# Patient Record
Sex: Male | Born: 1960
Health system: Southern US, Community
[De-identification: ages and names within clinical notes are randomized; demographics above are authoritative.]

## PROBLEM LIST (undated history)

## (undated) DIAGNOSIS — Z720 Tobacco use: Secondary | ICD-10-CM

## (undated) DIAGNOSIS — I10 Essential (primary) hypertension: Secondary | ICD-10-CM

## (undated) DIAGNOSIS — J45909 Unspecified asthma, uncomplicated: Secondary | ICD-10-CM

## (undated) DIAGNOSIS — I2510 Atherosclerotic heart disease of native coronary artery without angina pectoris: Secondary | ICD-10-CM

## (undated) DIAGNOSIS — R001 Bradycardia, unspecified: Secondary | ICD-10-CM

---

## 1999-09-05 ENCOUNTER — Encounter: Payer: Self-pay | Admitting: Emergency Medicine

## 1999-09-06 ENCOUNTER — Inpatient Hospital Stay (HOSPITAL_COMMUNITY): Admission: EM | Admit: 1999-09-06 | Discharge: 1999-09-06 | Payer: Self-pay | Admitting: Emergency Medicine

## 1999-09-20 ENCOUNTER — Ambulatory Visit (HOSPITAL_COMMUNITY): Admission: RE | Admit: 1999-09-20 | Discharge: 1999-09-20 | Payer: Self-pay | Admitting: Infectious Diseases

## 1999-09-27 ENCOUNTER — Ambulatory Visit (HOSPITAL_COMMUNITY): Admission: RE | Admit: 1999-09-27 | Discharge: 1999-09-27 | Payer: Self-pay | Admitting: Infectious Diseases

## 2000-07-01 ENCOUNTER — Emergency Department (HOSPITAL_COMMUNITY): Admission: EM | Admit: 2000-07-01 | Discharge: 2000-07-02 | Payer: Self-pay | Admitting: Emergency Medicine

## 2000-07-02 ENCOUNTER — Encounter: Payer: Self-pay | Admitting: Emergency Medicine

## 2000-07-07 ENCOUNTER — Emergency Department (HOSPITAL_COMMUNITY): Admission: EM | Admit: 2000-07-07 | Discharge: 2000-07-08 | Payer: Self-pay | Admitting: Emergency Medicine

## 2000-07-08 ENCOUNTER — Encounter: Payer: Self-pay | Admitting: Emergency Medicine

## 2000-11-25 ENCOUNTER — Inpatient Hospital Stay (HOSPITAL_COMMUNITY): Admission: EM | Admit: 2000-11-25 | Discharge: 2000-11-27 | Payer: Self-pay | Admitting: Emergency Medicine

## 2000-11-26 ENCOUNTER — Encounter: Payer: Self-pay | Admitting: Endocrinology

## 2001-03-23 ENCOUNTER — Encounter: Payer: Self-pay | Admitting: Emergency Medicine

## 2001-03-23 ENCOUNTER — Emergency Department (HOSPITAL_COMMUNITY): Admission: EM | Admit: 2001-03-23 | Discharge: 2001-03-23 | Payer: Self-pay | Admitting: Emergency Medicine

## 2002-01-12 ENCOUNTER — Emergency Department (HOSPITAL_COMMUNITY): Admission: EM | Admit: 2002-01-12 | Discharge: 2002-01-12 | Payer: Self-pay | Admitting: Emergency Medicine

## 2002-04-03 ENCOUNTER — Emergency Department (HOSPITAL_COMMUNITY): Admission: EM | Admit: 2002-04-03 | Discharge: 2002-04-03 | Payer: Self-pay | Admitting: Emergency Medicine

## 2002-04-03 ENCOUNTER — Encounter: Payer: Self-pay | Admitting: Emergency Medicine

## 2002-04-14 ENCOUNTER — Encounter: Admission: RE | Admit: 2002-04-14 | Discharge: 2002-04-14 | Payer: Self-pay | Admitting: Internal Medicine

## 2003-07-07 ENCOUNTER — Emergency Department (HOSPITAL_COMMUNITY): Admission: AD | Admit: 2003-07-07 | Discharge: 2003-07-07 | Payer: Self-pay | Admitting: Emergency Medicine

## 2003-07-21 ENCOUNTER — Emergency Department (HOSPITAL_COMMUNITY): Admission: EM | Admit: 2003-07-21 | Discharge: 2003-07-21 | Payer: Self-pay | Admitting: Emergency Medicine

## 2003-10-18 ENCOUNTER — Emergency Department (HOSPITAL_COMMUNITY): Admission: EM | Admit: 2003-10-18 | Discharge: 2003-10-19 | Payer: Self-pay | Admitting: *Deleted

## 2003-11-04 ENCOUNTER — Encounter: Admission: RE | Admit: 2003-11-04 | Discharge: 2003-11-04 | Payer: Self-pay | Admitting: Internal Medicine

## 2004-01-04 ENCOUNTER — Emergency Department (HOSPITAL_COMMUNITY): Admission: EM | Admit: 2004-01-04 | Discharge: 2004-01-05 | Payer: Self-pay | Admitting: Emergency Medicine

## 2004-05-25 ENCOUNTER — Emergency Department (HOSPITAL_COMMUNITY): Admission: EM | Admit: 2004-05-25 | Discharge: 2004-05-25 | Payer: Self-pay | Admitting: Emergency Medicine

## 2006-10-28 ENCOUNTER — Ambulatory Visit: Payer: Self-pay | Admitting: Internal Medicine

## 2007-04-01 ENCOUNTER — Ambulatory Visit: Payer: Self-pay | Admitting: Internal Medicine

## 2007-05-29 ENCOUNTER — Ambulatory Visit: Payer: Self-pay | Admitting: Family Medicine

## 2007-06-26 ENCOUNTER — Ambulatory Visit: Payer: Self-pay | Admitting: Internal Medicine

## 2007-07-28 ENCOUNTER — Ambulatory Visit (HOSPITAL_COMMUNITY): Admission: RE | Admit: 2007-07-28 | Discharge: 2007-07-28 | Payer: Self-pay | Admitting: Family Medicine

## 2007-07-29 ENCOUNTER — Ambulatory Visit: Payer: Self-pay | Admitting: Family Medicine

## 2007-07-29 ENCOUNTER — Encounter (INDEPENDENT_AMBULATORY_CARE_PROVIDER_SITE_OTHER): Payer: Self-pay | Admitting: Internal Medicine

## 2007-07-29 LAB — CONVERTED CEMR LAB
AST: 38 units/L — ABNORMAL HIGH (ref 0–37)
Albumin: 4 g/dL (ref 3.5–5.2)
Alkaline Phosphatase: 91 units/L (ref 39–117)
BUN: 12 mg/dL (ref 6–23)
CO2: 23 meq/L (ref 19–32)
Calcium: 9.3 mg/dL (ref 8.4–10.5)
Chloride: 104 meq/L (ref 96–112)
Cholesterol: 205 mg/dL — ABNORMAL HIGH (ref 0–200)
Eosinophils Absolute: 0.3 10*3/uL (ref 0.0–0.7)
Eosinophils Relative: 4 % (ref 0–5)
HCT: 45.4 % (ref 39.0–52.0)
HDL: 35 mg/dL — ABNORMAL LOW (ref >39)
LDL Cholesterol: 149 mg/dL — ABNORMAL HIGH (ref 0–99)
MCV: 91.2 fL (ref 78.0–100.0)
Monocytes Relative: 10 % (ref 3–12)
Potassium: 4.8 meq/L (ref 3.5–5.3)
Sodium: 139 meq/L (ref 135–145)
Total Bilirubin: 0.6 mg/dL (ref 0.3–1.2)
Total CHOL/HDL Ratio: 5.9
Total Protein: 7.5 g/dL (ref 6.0–8.3)

## 2007-08-07 ENCOUNTER — Ambulatory Visit: Payer: Self-pay | Admitting: Family Medicine

## 2007-10-08 ENCOUNTER — Emergency Department (HOSPITAL_COMMUNITY): Admission: EM | Admit: 2007-10-08 | Discharge: 2007-10-08 | Payer: Self-pay | Admitting: Emergency Medicine

## 2010-05-25 ENCOUNTER — Ambulatory Visit (HOSPITAL_BASED_OUTPATIENT_CLINIC_OR_DEPARTMENT_OTHER): Admission: RE | Admit: 2010-05-25 | Payer: Self-pay | Source: Home / Self Care | Admitting: Family Medicine

## 2010-06-26 ENCOUNTER — Ambulatory Visit (HOSPITAL_BASED_OUTPATIENT_CLINIC_OR_DEPARTMENT_OTHER): Payer: Self-pay

## 2010-07-03 ENCOUNTER — Emergency Department (HOSPITAL_COMMUNITY)
Admission: EM | Admit: 2010-07-03 | Discharge: 2010-07-04 | Disposition: A | Discharge status: Home / Self Care | Payer: Self-pay | Attending: Emergency Medicine | Admitting: Emergency Medicine

## 2010-07-03 DIAGNOSIS — K029 Dental caries, unspecified: Secondary | ICD-10-CM | POA: Insufficient documentation

## 2010-07-03 DIAGNOSIS — K089 Disorder of teeth and supporting structures, unspecified: Secondary | ICD-10-CM | POA: Insufficient documentation

## 2010-07-03 DIAGNOSIS — K047 Periapical abscess without sinus: Secondary | ICD-10-CM | POA: Insufficient documentation

## 2010-07-03 DIAGNOSIS — I10 Essential (primary) hypertension: Secondary | ICD-10-CM | POA: Insufficient documentation

## 2010-07-03 DIAGNOSIS — J45909 Unspecified asthma, uncomplicated: Secondary | ICD-10-CM | POA: Insufficient documentation

## 2010-07-25 ENCOUNTER — Ambulatory Visit (HOSPITAL_BASED_OUTPATIENT_CLINIC_OR_DEPARTMENT_OTHER): Payer: Self-pay

## 2010-08-28 ENCOUNTER — Ambulatory Visit (HOSPITAL_BASED_OUTPATIENT_CLINIC_OR_DEPARTMENT_OTHER): Payer: Self-pay

## 2010-09-21 NOTE — Discharge Summary (Signed)
[place]. [facility]  Patient:    Edward Farmer, Edward Farmer                        MRN: [id number] Adm. Date:  [id number] Disc. Date: [id number] Attending:  Levy Sjogren Dictator:   Tawni Millers, M.D. CC:         Fransisco Hertz, M.D.             Tawni Millers, M.D.                           Discharge Summary  DATE OF BIRTH:  1960/05/22  DISCHARGE DIAGNOSES: 1. Atypical chest pain. 2. Cocaine and alcohol abuse.  DISCHARGE MEDICATIONS:  None.  PROCEDURE PERFORMED:  None.  HISTORY OF PRESENT ILLNESS:  This is a 50 year old black male with no significant past medical history who presents with sharp chest pain that has been increasing for about three days associated with nausea, diaphoresis, and shortness of breath.  Pain is worsened by exertion and relieved by rest. There are positive palpitations and pain radiates to the left arm and feels numb.  Pain lasts varying amounts of time, usually two minutes.  He does report indigestion at times.  He also reports recent upper respiratory infection about a week ago and he also reports occasional feet swelling after being on his feet all day long.  His risk factors for coronary artery disease include smoking, family history, a father who had an MI in his 64s, and the fact that he is male.  Patient also admits to increased stress and pain with the increased stress.  Also, he gets the pain with deep breath occasionally.  PAST MEDICAL HISTORY:  Only for occasional chest pain which he says he has been getting since he was in high school.  Pain is similar to this pain, associated with activity, relieved by rest.  FAMILY HISTORY:  Father died in his 28s of a probable MI.  His mother is alive and well with hypertension.  He has siblings who are healthy.  SOCIAL HISTORY:  He is a Company secretary at [employer].  He smokes about a half pack per day for five years.  Occasional pot use, daily alcohol use, one to two  40-ouncers a day.  He answers positively to the caged questions.  He did use cocaine a few days prior to admission.  He is unmarried and has three kids.  MEDICATIONS:  Aspirin almost every day.  ALLERGIES:  He has no allergies.  REVIEW OF SYSTEMS:  Positive for occasional hot flashes, chest pain, and shortness of breath per history of present illness as well as indigestion and increased stress.  He denies dysuria, frequency, joint pain, constipation, diarrhea, headaches, or vision changes.  PHYSICAL EXAMINATION:  VITAL SIGNS:  He has a temperature of 97.2, blood pressure 106/79, pulse 96, respirations 20.  GENERAL:  He is a pleasant black male in no acute distress.  HEENT:  Within normal limits with no erythema, exudates, or thrush.  NECK:  Supple with no lymphadenopathy and no JVD.  LUNGS:  Clear to auscultation bilaterally with no wheezes or rhonchi heard. There is no distress.  CARDIOVASCULAR:  Exam shows a regular rate and rhythm with no murmurs heard and he has good carotid upstroke.  ABDOMEN:  Soft, nontender, nondistended, with normal active bowel sounds.  EXTREMITIES:  No clubbing, cyanosis, or edema, and he has  good strong peripheral pulses.  NEUROLOGIC:  Nonfocal.  HOSPITAL COURSE: #1 - CHEST PAIN:  Patient was admitted for rule-out MI.  His pain was relieved with, at times, nitroglycerin, though he continued to have an occasional episode of pain overnight.  He is ruled out with troponin I negative x 3.  His EKG also remained normal sinus rhythm with no changes of ischemia or infarction.  He was started empirically on Motrin for possible pleuritis. Patient had a lipid panel which showed that he had a cholesterol of 203 with triglycerides 85, HDL 56, and LDL of 130.  His chest x-ray showed no acute disease.  He was sent out with instructions to return on Sep 12, 1999, for a stress test with Dr. Maurice March and Dr. Verdis Frederickson.  He is to return to the emergency room if his  pain persists.  #2 - COCAINE/ALCOHOL USE:  Patient did test positive for cocaine in his urine drug screen.  He admits to using cocaine a few days prior to admission. Patient has received the number of ADS and has an appointment and says that he will follow up.  He also expresses interest in decreasing alcohol consumption. Will discuss this at a later date on his follow-ups.  #3 - SLIGHT INCREASED BILIRUBIN/AST/ALT:  Patient had a bilirubin of 2.4 upon admission with white count 15.2, hemoglobin 16.2, and platelets of 228, along with uric acid crystals, granular casts, and blood in his urine.  He also had a CK of 697 with an MB of 2.  I note that his bilirubin came down to 1.2 after being here overnight.  This was likely due to patients alcohol consumption. Will address testing patient for HIV, hepatitis B, and hepatitis C as an outpatient. DD:  09/06/99 TD:  09/08/99 Job: [id number]
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
## 2010-09-21 NOTE — Discharge Summary (Signed)
[place]. [facility]  Patient:    Edward Farmer, Edward Farmer                        MRN: [id number] Adm. Date:  [id number] Disc. Date: [id number] Attending:  Melody Haver                           Discharge Summary  HISTORY:  This is a 50 year old man who presents with a history of chest pain. The pain is nonspecific in quality.  He had previously been admitted to the hospital one year earlier with similar nonspecific pain and he had a stress test which was negative.  He presents now with similar nonspecific pain and also a history of cocaine use prior to this admission.  Physical examination was essentially normal.  ADMITTING DIAGNOSIS:  Chest pain, nonspecific with electrocardiographic changes.  HOSPITAL COURSE:  He was admitted to the hospital and observed.  He did not have any more specific chest pain.  He was seen in consultation by the cardiology service and a stress test was done.  This was negative.  The patient was then discharged.  DISCHARGE DIAGNOSIS:  Nonspecific chest pain, noncardiac.  Probably induced by cocaine abuse.  DISCHARGE MEDICATIONS:  None.  DISCHARGE INSTRUCTIONS:  The patient was advised to abstain from cocaine. DD:  12/06/00 TD:  12/08/00 Job: [id number]

## 2010-09-21 NOTE — Discharge Summary (Signed)
[place]. [facility]  Patient:    Edward Farmer, Edward Farmer                        MRN: [id number] Adm. Date:  [id number] Disc. Date: [id number] Attending:  Melody Haver                           Discharge Summary  NO DICTATION. DD:  12/06/00 TD:  12/08/00 Job: [id number]

## 2012-07-22 ENCOUNTER — Encounter (HOSPITAL_COMMUNITY): Payer: Self-pay | Admitting: *Deleted

## 2012-07-22 ENCOUNTER — Emergency Department (HOSPITAL_COMMUNITY): Payer: Self-pay

## 2012-07-22 ENCOUNTER — Emergency Department (HOSPITAL_COMMUNITY)
Admission: EM | Admit: 2012-07-22 | Discharge: 2012-07-22 | Disposition: A | Discharge status: Home / Self Care | Payer: Self-pay | Attending: Emergency Medicine | Admitting: Emergency Medicine

## 2012-07-22 DIAGNOSIS — R221 Localized swelling, mass and lump, neck: Secondary | ICD-10-CM | POA: Insufficient documentation

## 2012-07-22 DIAGNOSIS — I10 Essential (primary) hypertension: Secondary | ICD-10-CM | POA: Insufficient documentation

## 2012-07-22 DIAGNOSIS — T61771A Other fish poisoning, accidental (unintentional), initial encounter: Secondary | ICD-10-CM | POA: Insufficient documentation

## 2012-07-22 DIAGNOSIS — F17200 Nicotine dependence, unspecified, uncomplicated: Secondary | ICD-10-CM | POA: Insufficient documentation

## 2012-07-22 DIAGNOSIS — T783XXA Angioneurotic edema, initial encounter: Secondary | ICD-10-CM | POA: Insufficient documentation

## 2012-07-22 DIAGNOSIS — Y939 Activity, unspecified: Secondary | ICD-10-CM | POA: Insufficient documentation

## 2012-07-22 DIAGNOSIS — R220 Localized swelling, mass and lump, head: Secondary | ICD-10-CM | POA: Insufficient documentation

## 2012-07-22 DIAGNOSIS — J45909 Unspecified asthma, uncomplicated: Secondary | ICD-10-CM | POA: Insufficient documentation

## 2012-07-22 DIAGNOSIS — Y929 Unspecified place or not applicable: Secondary | ICD-10-CM | POA: Insufficient documentation

## 2012-07-22 HISTORY — DX: Unspecified asthma, uncomplicated: J45.909

## 2012-07-22 HISTORY — DX: Essential (primary) hypertension: I10

## 2012-07-22 LAB — COMPREHENSIVE METABOLIC PANEL
ALT: 43 U/L (ref 0–53)
AST: 32 U/L (ref 0–37)
Alkaline Phosphatase: 104 U/L (ref 39–117)
BUN: 10 mg/dL (ref 6–23)
Calcium: 9.6 mg/dL (ref 8.4–10.5)
Chloride: 101 mEq/L (ref 96–112)
Creatinine, Ser: 0.91 mg/dL (ref 0.50–1.35)

## 2012-07-22 LAB — CBC WITH DIFFERENTIAL/PLATELET
Basophils Absolute: 0 10*3/uL (ref 0.0–0.1)
Basophils Relative: 1 % (ref 0–1)
MCH: 29.7 pg (ref 26.0–34.0)
MCHC: 34.9 g/dL (ref 30.0–36.0)
Monocytes Relative: 10 % (ref 3–12)
Neutrophils Relative %: 52 % (ref 43–77)
WBC: 8.3 10*3/uL (ref 4.0–10.5)

## 2012-07-22 LAB — TROPONIN I: Troponin I: <0.3 ng/mL (ref <0.30)

## 2012-07-22 MED ORDER — DIPHENHYDRAMINE HCL 50 MG/ML IJ SOLN
25.0000 mg | Freq: Once | INTRAMUSCULAR | Status: AC
  Administered 2012-07-22: 25 mg via INTRAVENOUS
  Filled 2012-07-22: qty 1

## 2012-07-22 MED ORDER — DIPHENHYDRAMINE HCL 25 MG PO TABS: 25.0000 mg | ORAL_TABLET | Freq: Four times a day (QID) | ORAL | Status: DC

## 2012-07-22 MED ORDER — PREDNISONE 50 MG PO TABS: ORAL_TABLET | ORAL | Status: DC

## 2012-07-22 MED ORDER — FAMOTIDINE IN NACL 20-0.9 MG/50ML-% IV SOLN
20.0000 mg | Freq: Once | INTRAVENOUS | Status: AC
  Administered 2012-07-22: 20 mg via INTRAVENOUS
  Filled 2012-07-22: qty 50

## 2012-07-22 MED ORDER — EPINEPHRINE 0.3 MG/0.3ML IJ DEVI
0.3000 mg | Freq: Once | INTRAMUSCULAR | Status: AC
  Administered 2012-07-22: 0.3 mg via INTRAMUSCULAR
  Filled 2012-07-22: qty 0.3

## 2012-07-22 MED ORDER — METHYLPREDNISOLONE SODIUM SUCC 125 MG IJ SOLR
125.0000 mg | Freq: Once | INTRAMUSCULAR | Status: AC
  Administered 2012-07-22: 125 mg via INTRAVENOUS
  Filled 2012-07-22: qty 2

## 2012-07-22 MED ORDER — FAMOTIDINE 20 MG PO TABS: 20.0000 mg | ORAL_TABLET | Freq: Two times a day (BID) | ORAL | Status: DC

## 2012-07-22 NOTE — ED Provider Notes (Signed)
History     CSN: [id number]  Arrival date & time 07/22/12  4098   First MD Initiated Contact with Patient 07/22/12 [id number]      Chief Complaint  Patient presents with  . Allergic Reaction    (Consider location/radiation/quality/duration/timing/severity/associated sxs/prior treatment) HPI Comments: Upper lip swelling for the past two days.  Progressively worsening since yesterday.  Denies difficulty breathing or swallowing.  No CP or SOB. No leg swelling.  Endorses dry cough.  Symptoms started after eating tilapia.  Also takes tizac.  The history is provided by the patient.    Past Medical History  Diagnosis Date  . Hypertension   . Asthma     History reviewed. No pertinent past surgical history.  History reviewed. No pertinent family history.  History  Substance Use Topics  . Smoking status: Current Every Day Smoker    Types: Cigarettes  . Smokeless tobacco: Not on file  . Alcohol Use: Yes     Comment: occ      Review of Systems  Constitutional: Negative for fever, activity change and appetite change.  HENT: Positive for facial swelling. Negative for congestion, sore throat, rhinorrhea and drooling.   Respiratory: Negative for cough, chest tightness and shortness of breath.   Cardiovascular: Negative for chest pain.  Gastrointestinal: Negative for nausea, vomiting and abdominal pain.  Genitourinary: Negative for dysuria, hematuria and testicular pain.  Musculoskeletal: Negative for back pain.  Skin: Negative for wound.  Neurological: Negative for dizziness, weakness and light-headedness.  A complete 10 system review of systems was obtained and all systems are negative except as noted in the HPI and PMH.    Allergies  Review of patient's allergies indicates no known allergies.  Home Medications  No current outpatient prescriptions on file.  BP 175/98  Pulse 71  Temp(Src) 97.7 F (36.5 C) (Oral)  Resp 20  SpO2 94%  Physical Exam  Constitutional: He is  oriented to person, place, and time. He appears well-developed and well-nourished. No distress.  HENT:  Head: Normocephalic and atraumatic.  Mouth/Throat: Oropharynx is clear and moist. No oropharyngeal exudate.  Upper lip swelling.   Oropharynx normal, uvula midline. No tongue elevation/  Eyes: Conjunctivae and EOM are normal. Pupils are equal, round, and reactive to light.  Neck: Normal range of motion. Neck supple.  Cardiovascular: Normal rate, regular rhythm and normal heart sounds.   No murmur heard. Pulmonary/Chest: Effort normal and breath sounds normal. No respiratory distress. He has no wheezes.  Abdominal: Soft. There is no tenderness. There is no rebound and no guarding.  Musculoskeletal: Normal range of motion. He exhibits no edema and no tenderness.  Neurological: He is alert and oriented to person, place, and time. No cranial nerve deficit. He exhibits normal muscle tone. Coordination normal.  Skin: Skin is warm.    ED Course  Procedures (including critical care time)  Labs Reviewed  COMPREHENSIVE METABOLIC PANEL - Abnormal; Notable for the following:    Glucose, Bld 112 (*)    All other components within normal limits  CBC WITH DIFFERENTIAL  TROPONIN I   Dg Chest 2 View  07/22/2012  *RADIOLOGY REPORT*  Clinical Data: Shortness of breath.  Swollen lip.  CHEST - 2 VIEW  Comparison: Chest x-ray 07/28/2007.  Findings: Linear opacities in the lung bases bilaterally are similar to the prior examination and most compatible with areas of mild chronic scarring.  No acute consolidative airspace disease. No pleural effusions.  Mild thickening of the minor fissure slightly increased compared  to the prior study.  Mild cardiomegaly with prominence of the left ventricular contour, which could suggest underlying left ventricular hypertrophy.  Pulmonary vasculature is normal.  The upper mediastinal contours are unremarkable.  Atherosclerosis in the thoracic aorta.  IMPRESSION: 1.  Mild  chronic bibasilar scarring and increasing thickening of the minor fissure. 2.  Mild cardiomegaly with evidence to suggest left ventricular hypertrophy. 3.  Atherosclerosis.   Original Report Authenticated By: Trudie Reed, M.D.      No diagnosis found.    MDM  Upper lip swelling without trouble breathing or chest pain.  No respiratory distress. No chest pain. Patient given IV fluids, antihistamines, IV steroids, IM epinephrine to treat angioedema.  Patient takes an uncertain medication for his blood pressure. This may be what is causing his angioedema.  Patient observed in the ED with no deterioration in symptoms. His upper lip swelling has improved. He has no difficulty breathing or swallowing is no tongue or throat swelling. He is instructed to discontinue his blood pressure medications which may be diltiazem.  He'll be discharged on by mouth steroids antihistamines. Needs followup for PCP. Strict return precautions discussed including worsening swelling, difficulty breathing, throat swelling, chest pain, shortness of breath, difficulty swallowing.   Date: 07/22/2012  Rate: 71  Rhythm: normal sinus rhythm  QRS Axis: normal  Intervals: normal  ST/T Wave abnormalities: lateral T wave inversions  Conduction Disutrbances:none  Narrative Interpretation: new lateral T wave inversions  Old EKG Reviewed: changes noted      Glynn Octave, MD 07/22/12 1609

## 2012-07-22 NOTE — ED Notes (Signed)
Patient transported to X-ray 

## 2012-07-22 NOTE — ED Notes (Signed)
Returned from xray

## 2012-07-22 NOTE — ED Notes (Signed)
Pt reports swelling to upper lip x 2 days, possible allergic reaction to tilapia or bp meds. Airway intact.

## 2013-12-28 ENCOUNTER — Other Ambulatory Visit (INDEPENDENT_AMBULATORY_CARE_PROVIDER_SITE_OTHER): Payer: No Typology Code available for payment source

## 2013-12-28 DIAGNOSIS — B182 Chronic viral hepatitis C: Secondary | ICD-10-CM

## 2013-12-28 LAB — CBC WITH DIFFERENTIAL/PLATELET
Basophils Absolute: 0 10*3/uL (ref 0.0–0.1)
Basophils Relative: 0 % (ref 0–1)
EOS PCT: 2 % (ref 0–5)
Eosinophils Absolute: 0.2 10*3/uL (ref 0.0–0.7)
HCT: 43.1 % (ref 39.0–52.0)
HEMOGLOBIN: 14.9 g/dL (ref 13.0–17.0)
LYMPHS PCT: 32 % (ref 12–46)
Lymphs Abs: 3.2 10*3/uL (ref 0.7–4.0)
MCH: 29.3 pg (ref 26.0–34.0)
MCHC: 34.6 g/dL (ref 30.0–36.0)
MCV: 84.8 fL (ref 78.0–100.0)
Monocytes Absolute: 0.7 10*3/uL (ref 0.1–1.0)
Monocytes Relative: 7 % (ref 3–12)
NEUTROS ABS: 5.8 10*3/uL (ref 1.7–7.7)
Neutrophils Relative %: 59 % (ref 43–77)
Platelets: 280 10*3/uL (ref 150–400)
RBC: 5.08 MIL/uL (ref 4.22–5.81)
RDW: 14.8 % (ref 11.5–15.5)
WBC: 9.9 10*3/uL (ref 4.0–10.5)

## 2013-12-29 LAB — HEPATITIS A ANTIBODY, TOTAL: HEP A TOTAL AB: NONREACTIVE

## 2013-12-29 LAB — HEPATITIS B SURFACE ANTIGEN: HEP B S AG: NEGATIVE

## 2013-12-29 LAB — ANA: Anti Nuclear Antibody(ANA): NEGATIVE

## 2013-12-29 LAB — IRON: IRON: 67 ug/dL (ref 42–165)

## 2013-12-29 LAB — PROTIME-INR
INR: 1 (ref <1.50)
PROTHROMBIN TIME: 13.2 s (ref 11.6–15.2)

## 2013-12-29 LAB — HEPATITIS B SURFACE ANTIBODY,QUALITATIVE: Hep B S Ab: POSITIVE — AB

## 2013-12-29 LAB — HEPATITIS B CORE ANTIBODY, TOTAL: Hep B Core Total Ab: REACTIVE — AB

## 2014-01-27 ENCOUNTER — Encounter: Payer: Self-pay | Admitting: Internal Medicine

## 2014-01-27 ENCOUNTER — Ambulatory Visit (INDEPENDENT_AMBULATORY_CARE_PROVIDER_SITE_OTHER): Payer: No Typology Code available for payment source | Admitting: Internal Medicine

## 2014-01-27 VITALS — BP 142/94 | HR 86 | Temp 97.8°F | Ht 70.0 in | Wt 204.0 lb

## 2014-01-27 DIAGNOSIS — E669 Obesity, unspecified: Secondary | ICD-10-CM

## 2014-01-27 DIAGNOSIS — Z23 Encounter for immunization: Secondary | ICD-10-CM

## 2014-01-27 DIAGNOSIS — B182 Chronic viral hepatitis C: Secondary | ICD-10-CM | POA: Insufficient documentation

## 2014-01-27 DIAGNOSIS — I10 Essential (primary) hypertension: Secondary | ICD-10-CM | POA: Insufficient documentation

## 2014-01-27 DIAGNOSIS — Z6832 Body mass index (BMI) 32.0-32.9, adult: Secondary | ICD-10-CM | POA: Insufficient documentation

## 2014-01-27 DIAGNOSIS — J45909 Unspecified asthma, uncomplicated: Secondary | ICD-10-CM

## 2014-01-27 NOTE — Addendum Note (Signed)
Addended by: Wendall Mola A on: 01/27/2014 10:24 AM   Modules accepted: Orders

## 2014-01-27 NOTE — Progress Notes (Signed)
+  Edward Farmer is a 53 y.o. male who presents for initial evaluation and management of a positive Hepatitis C antibody test.  Patient tested positive after plasma donation several years ago and then did not follow up about it. Hepatitis C risk factors present are: snorting drugs more than 20 years ago. Patient denies history of blood transfusion, IV drug abuse, multiple sexual partners, renal dialysis, sexual contact with person with liver disease, tattoos. Patient has had other studies performed. Results: hepatitis C RNA by PCR, result: positive. Patient has not had prior treatment for Hepatitis C. Patient does not have a past history of liver disease. Patient does not have a family history of liver disease.   HPI: He has not had any issues, no fatigue or concerns.  He does drink some alcohol.  No active drug use.    Patient does not have documented immunity to Hepatitis A. Patient does have documented immunity to Hepatitis B.     Review of Systems A comprehensive review of systems was negative.   Past Medical History  Diagnosis Date  . Hypertension   . Asthma     Prior to Admission medications   Not on File    No Known Allergies  History  Substance Use Topics  . Smoking status: Current Every Day Smoker -- 0.25 packs/day    Types: Cigarettes  . Smokeless tobacco: Never Used     Comment: cutting back  . Alcohol Use: Yes     Comment: occ    No family history on file.    Objective:   Filed Vitals:   01/27/14 0850  BP: 142/94  Pulse: 86  Temp: 97.8 F (36.6 C)   in no apparent distress and alert HEENT: anicteric Cor RRR and No murmurs clear Bowel sounds are normal, liver is not enlarged, spleen is not enlarged peripheral pulses normal, no pedal edema, no clubbing or cyanosis negative for - jaundice, spider hemangioma, telangiectasia, palmar erythema, ecchymosis and atrophy  Laboratory Genotype:  No results found for this basename: hcvgenotype   HCV viral load:   No results found for this basename: HCVQUANT   Lab Results  Component Value Date   WBC 9.9 12/28/2013   HGB 14.9 12/28/2013   HCT 43.1 12/28/2013   MCV 84.8 12/28/2013   PLT 280 12/28/2013    Lab Results  Component Value Date   CREATININE 0.91 07/22/2012   BUN 10 07/22/2012   NA 137 07/22/2012   K 3.5 07/22/2012   CL 101 07/22/2012   CO2 22 07/22/2012    Lab Results  Component Value Date   ALT 43 07/22/2012   AST 32 07/22/2012   ALKPHOS 104 07/22/2012   BILITOT 0.5 07/22/2012   INR 1.00 12/28/2013      Assessment: Hepatitis C genotype 1a - labs from PCP and will scan in.    Plan: 1) Patient counseled extensively on limiting acetaminophen to no more than 2 grams daily, avoidance of alcohol. 2) Transmission discussed with patient including sexual transmission, sharing razors and toothbrush.   3) Will need referral to gastroenterology if concern for cirrhosis 4) Will need referral for substance abuse counseling: No. 5) Will prescribe Harvoni for 12 weeks once work up complete 6) Hepatitis A vaccine Yes.   7) Hepatitis B vaccine No. 8) Pneumovax vaccine if concern for cirrhosis 9) will follow up in 2 months and consider drug assistance program at that time.

## 2014-02-09 ENCOUNTER — Ambulatory Visit (HOSPITAL_COMMUNITY): Payer: No Typology Code available for payment source

## 2014-02-23 ENCOUNTER — Ambulatory Visit (HOSPITAL_COMMUNITY)
Admission: RE | Admit: 2014-02-23 | Discharge: 2014-02-23 | Disposition: A | Discharge status: Home / Self Care | Payer: Self-pay | Source: Ambulatory Visit | Attending: Internal Medicine | Admitting: Internal Medicine

## 2014-02-23 DIAGNOSIS — B182 Chronic viral hepatitis C: Secondary | ICD-10-CM

## 2014-02-23 DIAGNOSIS — N281 Cyst of kidney, acquired: Secondary | ICD-10-CM | POA: Insufficient documentation

## 2014-02-24 ENCOUNTER — Other Ambulatory Visit (HOSPITAL_COMMUNITY): Payer: Self-pay

## 2014-03-29 ENCOUNTER — Ambulatory Visit: Payer: No Typology Code available for payment source | Admitting: Internal Medicine

## 2014-04-06 ENCOUNTER — Encounter: Payer: Self-pay | Admitting: Internal Medicine

## 2014-04-06 ENCOUNTER — Ambulatory Visit (INDEPENDENT_AMBULATORY_CARE_PROVIDER_SITE_OTHER): Payer: No Typology Code available for payment source | Admitting: Internal Medicine

## 2014-04-06 VITALS — BP 148/92 | HR 76 | Temp 97.5°F | Ht 70.0 in | Wt 206.0 lb

## 2014-04-06 DIAGNOSIS — B182 Chronic viral hepatitis C: Secondary | ICD-10-CM

## 2014-04-06 MED ORDER — LEDIPASVIR-SOFOSBUVIR 90-400 MG PO TABS: 1.0000 | ORAL_TABLET | Freq: Every day | ORAL | Status: DC

## 2014-04-06 NOTE — Progress Notes (Signed)
   Subjective:    Patient ID: Edward Farmer, male    DOB: December 20, 1960, 53 y.o.   MRN: [id number]  HPI Here for follow up of HCV.  Genotype 1a, started on hepatitis A series,history of alcohol use.  Had elastography and is F0/1.  No new issues.     Review of Systems     Objective:   Physical Exam        Assessment & Plan:

## 2014-04-06 NOTE — Assessment & Plan Note (Signed)
[facility].  RTC after starting or in 1 year to retry if denied. He is aware that pharmacy may contact him for more information.

## 2014-04-06 NOTE — Patient Instructions (Signed)
Date 04/06/14  Dear Mr. Edward Farmer, As discussed in the ID Clinic, your hepatitis C therapy will include the following medications:          Harvoni 90mg /400mg  tablet:           Take 1 tablet by mouth once daily   Please note that ALL MEDICATIONS WILL START ON THE SAME DATE for a total of 12 weeks. ---------------------------------------------------------------- Your HCV Treatment Start Date: TBA   Your HCV genotype:  1b    Liver Fibrosis: F0/F1   ---------------------------------------------------------------- YOUR PHARMACY CONTACT:   [facility] [address] Phone: [phone] Hours: Monday to Friday 7:30 am to 6:00 pm   Please always contact your pharmacy at least 3-4 business days before you run out of medications to ensure your next month's medication is ready or 1 week prior to running out if you receive it by mail.  Remember, each prescription is for 28 days. ---------------------------------------------------------------- GENERAL NOTES REGARDING YOUR HEPATITIS C MEDICATION:  SOFOSBUVIR/LEDIPASVIR (HARVONI): - Harvoni tablet is taken daily with OR without food. - The tablets are orange. - The tablets should be stored at room temperature.  - Acid reducing agents such as H2 blockers (ie. Pepcid (famotidine), Zantac (ranitidine), Tagamet (cimetidine), Axid (nizatidine) and proton pump inhibitors (ie. Prilosec (omeprazole), Protonix (pantoprazole), Nexium (esomeprazole), or Aciphex (rabeprazole)) can decrease effectiveness of Harvoni. Do not take until you have discussed with a health care provider.    -Antacids that contain magnesium and/or aluminum hydroxide (ie. Milk of Magensia, Rolaids, Gaviscon, Maalox, Mylanta, an dArthritis Pain Formula)can reduce absorption of Harvoni, so take them at least 4 hours before or after Harvoni.  -Calcium carbonate (calcium supplements or antacids such as Tums, Caltrate,  Os-Cal)needs to be taken at least 4 hours hours before or after Harvoni.  -[name] or any products that contain [name] like some herbal supplements  Please inform the office prior to starting any of these medications.  - The common side effects with Harvoni:      1. Fatigue      2. Headache      3. Nausea      4. Diarrhea      5. Insomnia   Support Path is a suite of resources designed to help patients start with HARVONI and move toward treatment completion GETTING STARTED Support Path helps patients access therapy and get off to an efficient start  Benefits investigation and prior authorization support Co-pay and other financial assistance A specialty pharmacy finder CO-PAY COUPON The HARVONI co-pay coupon may help eligible patients lower their out-of-pocket costs. With a co-pay coupon, most eligible patients may pay no more than $5 per co-pay (restrictions apply) www.harvoni.com call ([phone] [phone] Not valid for patients enrolled in government healthcare prescription drug programs, such as Medicare Part D and Medicaid. Patients in the coverage gap known as the "donut hole" also are not eligible The HARVONI co-pay coupon program will cover the out-of-pocket costs for HARVONI prescriptions up to a maximum of 25% of the catalog price of a 12-week regimen of HARVONI  Please note that this only lists the most common side effects and is NOT a comprehensive list of the potential side effects of these medications. For more information, please review the drug information sheets that come with your medication package from the pharmacy.  ---------------------------------------------------------------- GENERAL HELPFUL HINTS ON HCV THERAPY: 1. No alcohol. 2. Protect against sun-sensitivity/sunburns (wear sunglasses, hat, long sleeves, pants and  sunscreen). 3. Stay well-hydrated/well-moisturized. 4. Notify the ID Clinic of any changes in your other over-the-counter/herbal or  prescription medications. 5. If you miss a dose of your medication, take the missed dose as soon as you remember. Return to your regular time/dose schedule the next day.  6.  Do not stop taking your medications without first talking with your healthcare provider. 7.  You may take Tylenol (acetaminophen), as long as the dose is less than 2000 mg (OR no more than 4 tablets of the Tylenol Extra Strengths 500mg  tablet) in 24 hours. 8.  You will need to obtain routine labs and/or office visits at [facility] at weeks 2, 4, 8,  and 12 as well as 12 and 24 weeks after completion of treatment.   [name], [name] for [place], [name]  [id number] [phone]

## 2014-04-14 ENCOUNTER — Telehealth: Payer: Self-pay | Admitting: *Deleted

## 2014-04-14 NOTE — Telephone Encounter (Signed)
[facility]  [facility] is needing copies of the pt's Hep C genotype and viral load.  Left message for Delaney Meigsamara to fax these labs to [facility] at [phone].

## 2014-04-18 ENCOUNTER — Other Ambulatory Visit: Payer: Self-pay | Admitting: Internal Medicine

## 2014-04-18 DIAGNOSIS — B182 Chronic viral hepatitis C: Secondary | ICD-10-CM

## 2014-04-19 ENCOUNTER — Other Ambulatory Visit: Payer: Self-pay | Admitting: *Deleted

## 2014-04-19 DIAGNOSIS — B182 Chronic viral hepatitis C: Secondary | ICD-10-CM

## 2014-04-20 ENCOUNTER — Telehealth: Payer: Self-pay | Admitting: *Deleted

## 2014-04-20 NOTE — Telephone Encounter (Signed)
Edward Farmer, patient called stating he was returning your call "from earlier".  Please advise, unsure how to respond as there are no notes.  Andree CossHowell, Edward Farmer M, RN

## 2014-04-21 ENCOUNTER — Encounter: Payer: Self-pay | Admitting: Internal Medicine

## 2014-05-18 ENCOUNTER — Other Ambulatory Visit: Payer: No Typology Code available for payment source

## 2014-05-18 DIAGNOSIS — B182 Chronic viral hepatitis C: Secondary | ICD-10-CM

## 2014-05-18 LAB — COMPLETE METABOLIC PANEL WITH GFR
ALBUMIN: 3.8 g/dL (ref 3.5–5.2)
ALT: 22 U/L (ref 0–53)
AST: 17 U/L (ref 0–37)
Alkaline Phosphatase: 82 U/L (ref 39–117)
BUN: 14 mg/dL (ref 6–23)
CALCIUM: 10 mg/dL (ref 8.4–10.5)
CHLORIDE: 102 meq/L (ref 96–112)
CO2: 28 meq/L (ref 19–32)
Creat: 0.99 mg/dL (ref 0.50–1.35)
GFR, Est African American: >89 mL/min
GFR, Est Non African American: 86 mL/min
GLUCOSE: 83 mg/dL (ref 70–99)
Potassium: 4.4 mEq/L (ref 3.5–5.3)
Sodium: 141 mEq/L (ref 135–145)
TOTAL PROTEIN: 7.5 g/dL (ref 6.0–8.3)
Total Bilirubin: 0.4 mg/dL (ref 0.2–1.2)

## 2014-05-20 LAB — HEPATITIS C RNA QUANTITATIVE: HCV Quantitative: NOT DETECTED IU/mL (ref <15)

## 2014-05-25 ENCOUNTER — Ambulatory Visit (INDEPENDENT_AMBULATORY_CARE_PROVIDER_SITE_OTHER): Payer: No Typology Code available for payment source | Admitting: Internal Medicine

## 2014-05-25 ENCOUNTER — Encounter: Payer: Self-pay | Admitting: Internal Medicine

## 2014-05-25 VITALS — BP 175/106 | HR 91 | Temp 98.0°F | Wt 211.0 lb

## 2014-05-25 DIAGNOSIS — B182 Chronic viral hepatitis C: Secondary | ICD-10-CM

## 2014-05-25 DIAGNOSIS — I10 Essential (primary) hypertension: Secondary | ICD-10-CM

## 2014-05-25 NOTE — Assessment & Plan Note (Signed)
He is going to follow-up with his primary doctor

## 2014-05-25 NOTE — Assessment & Plan Note (Signed)
He is doing great on his medication. Undetectable after under 4 weeks. He will continue for 2 more months and I will check his labs after completion. He already has his refill. I will see him 1 week after his end of treatment lab. I did discuss that he'll get one more after that after 3 months to determine if there is relapse or not.

## 2014-05-25 NOTE — Progress Notes (Signed)
   Subjective:    Patient ID: Edward Farmer, male    DOB: [birth year], 54 y.o.   MRN: [id number]  HPI He is here for follow-up of hepatitis C. He has genotype 1A and elastography consistent with F0 to 1. He was prescribed Harvoni through support path from [facility] and has started. He is now one month into his prescription and his first set of blood tests show an undetectable virus. No significant issues and takes daily.   Review of Systems  Constitutional: Negative for fatigue.  Gastrointestinal: Negative for nausea and diarrhea.  Skin: Negative for rash.  Neurological: Negative for dizziness, light-headedness and headaches.       Objective:   Physical Exam  Constitutional: He appears well-developed and well-nourished.  Eyes: No scleral icterus.  Cardiovascular: Normal rate, regular rhythm and normal heart sounds.   No murmur heard. Pulmonary/Chest: Effort normal and breath sounds normal. No respiratory distress.  Skin: No rash noted.          Assessment & Plan:

## 2014-06-22 ENCOUNTER — Other Ambulatory Visit: Payer: Self-pay | Admitting: Internal Medicine

## 2014-06-22 ENCOUNTER — Telehealth: Payer: Self-pay | Admitting: *Deleted

## 2014-06-22 NOTE — Telephone Encounter (Signed)
Patient has received his 3rd bottle of Harvoni. Refill request denied.

## 2014-06-22 NOTE — Telephone Encounter (Signed)
Patient's pharmacy requesting refill of Harvoni. Spoke with Ulyses SouthwardMinh Pham for advice. Per CatalinaMinh, patient should have already received and started his 3rd bottle of Harvoni. Left message with patient asking him to call [facility].  Need to confirm that patient has already received 3 bottles of Harvoni.  If so, he has completed treatment and does not need this refilled.   Andree CossHowell, Lanisha Stepanian M, RN

## 2014-07-20 ENCOUNTER — Telehealth: Payer: Self-pay | Admitting: *Deleted

## 2014-07-20 ENCOUNTER — Other Ambulatory Visit (INDEPENDENT_AMBULATORY_CARE_PROVIDER_SITE_OTHER): Payer: No Typology Code available for payment source

## 2014-07-20 DIAGNOSIS — B182 Chronic viral hepatitis C: Secondary | ICD-10-CM

## 2014-07-20 NOTE — Telephone Encounter (Signed)
Patient arrived for his labs, stated to Mariea ClontsKaren Green that he felt "his blood pressure was up."  [facility]. Clydie BraunKaren notified this RN.  Patient's blood pressure was 150/95 and 161/98 on subsequent checks.  Patient reports he took his medication as directed this morning.  He notes a slight headache, "feels a little spotty." He feels he is ok without seeing a doctor here.  He states his PCP is Concepcion LivingLinda Bernhardt, F[facility].  Patient states he feels stable, just wanted to see what his numbers were.  He [facility] with [facility].  OK per Tomasita Morrowammy King, RN.  Patient left in stable condition. Andree CossHowell, Hope Brandenburger M, RN

## 2014-07-21 LAB — HEPATITIS C RNA QUANTITATIVE: HCV Quantitative: NOT DETECTED IU/mL (ref <15)

## 2014-08-09 ENCOUNTER — Ambulatory Visit: Payer: No Typology Code available for payment source | Admitting: Internal Medicine

## 2016-06-21 NOTE — Congregational Nurse Program (Signed)
Congregational Nurse Program Note  Date of Encounter: 06/17/2016  Past Medical History: Past Medical History:  Diagnosis Date  . Asthma   . Hypertension     Encounter Details:     CNP Questionnaire - 06/17/16 0953      Patient Demographics   Is this a new or existing patient? New   Patient is considered a/an Not Applicable   Race African-American/Black     Patient Assistance   Location of Patient Assistance Not Applicable   Patient's financial/insurance status Low Income;Self-Pay (Uninsured)   Uninsured Patient ([employer] officer, trade unionCard/Care Connects) Yes   Patient referred to apply for the following financial assistance [facility] doctorCard/Care Connects Renewal   Food insecurities addressed Not Technical brewerApplicable   Transportation assistance No   Assistance securing medications No   Product/process development scientistducational health offerings Navigating the healthcare system;Other     Encounter Details   Primary purpose of visit Education/Health Concerns;Other   Was an Emergency Department visit averted? Not Applicable   Does patient have a medical provider? Yes   Patient referred to Clinic   Was a mental health screening completed? (GAINS tool) No   Does patient have dental issues? Yes   Was a dental referral made? Yes   Does patient have vision issues? No   Does your patient have an abnormal blood pressure today? No   Since previous encounter, have you referred patient for abnormal blood pressure that resulted in a new diagnosis or medication change? No   Does your patient have an abnormal blood glucose today? No   Since previous encounter, have you referred patient for abnormal blood glucose that resulted in a new diagnosis or medication change? No   Was there a life-saving intervention made? No     Requesting assistance with dental care.  Made referral to Lavinia SharpsMary Ann [name] NP to facilitate referral into dental clinic.  Also wanted assistance with community resources.  Referred to Congregational Social Work intern for follow  up

## 2016-07-14 ENCOUNTER — Encounter (HOSPITAL_COMMUNITY): Payer: Self-pay

## 2016-07-14 ENCOUNTER — Inpatient Hospital Stay (HOSPITAL_COMMUNITY)
Admission: EM | Admit: 2016-07-14 | Discharge: 2016-07-22 | DRG: 234 | Disposition: A | Discharge status: Home / Self Care | Payer: Self-pay | Attending: Cardiothoracic Surgery | Admitting: Cardiothoracic Surgery

## 2016-07-14 ENCOUNTER — Emergency Department (HOSPITAL_COMMUNITY): Payer: Self-pay

## 2016-07-14 DIAGNOSIS — E785 Hyperlipidemia, unspecified: Secondary | ICD-10-CM | POA: Diagnosis present

## 2016-07-14 DIAGNOSIS — Z79899 Other long term (current) drug therapy: Secondary | ICD-10-CM

## 2016-07-14 DIAGNOSIS — I2510 Atherosclerotic heart disease of native coronary artery without angina pectoris: Secondary | ICD-10-CM

## 2016-07-14 DIAGNOSIS — R001 Bradycardia, unspecified: Secondary | ICD-10-CM

## 2016-07-14 DIAGNOSIS — J9811 Atelectasis: Secondary | ICD-10-CM | POA: Diagnosis not present

## 2016-07-14 DIAGNOSIS — N179 Acute kidney failure, unspecified: Secondary | ICD-10-CM | POA: Diagnosis present

## 2016-07-14 DIAGNOSIS — Z8249 Family history of ischemic heart disease and other diseases of the circulatory system: Secondary | ICD-10-CM

## 2016-07-14 DIAGNOSIS — I200 Unstable angina: Secondary | ICD-10-CM | POA: Diagnosis present

## 2016-07-14 DIAGNOSIS — D62 Acute posthemorrhagic anemia: Secondary | ICD-10-CM | POA: Diagnosis not present

## 2016-07-14 DIAGNOSIS — E876 Hypokalemia: Secondary | ICD-10-CM | POA: Diagnosis not present

## 2016-07-14 DIAGNOSIS — I25110 Atherosclerotic heart disease of native coronary artery with unstable angina pectoris: Principal | ICD-10-CM | POA: Diagnosis present

## 2016-07-14 DIAGNOSIS — R079 Chest pain, unspecified: Secondary | ICD-10-CM

## 2016-07-14 DIAGNOSIS — B182 Chronic viral hepatitis C: Secondary | ICD-10-CM | POA: Diagnosis present

## 2016-07-14 DIAGNOSIS — F17210 Nicotine dependence, cigarettes, uncomplicated: Secondary | ICD-10-CM | POA: Diagnosis present

## 2016-07-14 DIAGNOSIS — Z09 Encounter for follow-up examination after completed treatment for conditions other than malignant neoplasm: Secondary | ICD-10-CM

## 2016-07-14 DIAGNOSIS — Z951 Presence of aortocoronary bypass graft: Secondary | ICD-10-CM

## 2016-07-14 DIAGNOSIS — E8770 Fluid overload, unspecified: Secondary | ICD-10-CM | POA: Diagnosis not present

## 2016-07-14 DIAGNOSIS — I10 Essential (primary) hypertension: Secondary | ICD-10-CM | POA: Diagnosis present

## 2016-07-14 DIAGNOSIS — I517 Cardiomegaly: Secondary | ICD-10-CM | POA: Diagnosis present

## 2016-07-14 DIAGNOSIS — J45909 Unspecified asthma, uncomplicated: Secondary | ICD-10-CM | POA: Diagnosis present

## 2016-07-14 HISTORY — DX: Bradycardia, unspecified: R00.1

## 2016-07-14 HISTORY — DX: Tobacco use: Z72.0

## 2016-07-14 LAB — CBC
HCT: 37.6 % — ABNORMAL LOW (ref 39.0–52.0)
Hemoglobin: 12.4 g/dL — ABNORMAL LOW (ref 13.0–17.0)
MCH: 28.8 pg (ref 26.0–34.0)
MCHC: 33 g/dL (ref 30.0–36.0)
MCV: 87.2 fL (ref 78.0–100.0)
Platelets: 206 10*3/uL (ref 150–400)
RBC: 4.31 MIL/uL (ref 4.22–5.81)
RDW: 15.4 % (ref 11.5–15.5)
WBC: 10.7 10*3/uL — ABNORMAL HIGH (ref 4.0–10.5)

## 2016-07-14 LAB — BASIC METABOLIC PANEL
Anion gap: 11 (ref 5–15)
BUN: 15 mg/dL (ref 6–20)
CO2: 26 mmol/L (ref 22–32)
Calcium: 9 mg/dL (ref 8.9–10.3)
Chloride: 104 mmol/L (ref 101–111)
Creatinine, Ser: 1.29 mg/dL — ABNORMAL HIGH (ref 0.61–1.24)
GFR calc Af Amer: >60 mL/min (ref >60)
GFR calc non Af Amer: >60 mL/min (ref >60)
Glucose, Bld: 95 mg/dL (ref 65–99)
Potassium: 3.7 mmol/L (ref 3.5–5.1)
Sodium: 141 mmol/L (ref 135–145)

## 2016-07-14 LAB — LIPID PANEL
Cholesterol: 174 mg/dL (ref 0–200)
HDL: 34 mg/dL — AB (ref >40)
LDL Cholesterol: 94 mg/dL (ref 0–99)
Total CHOL/HDL Ratio: 5.1 RATIO
Triglycerides: 231 mg/dL — ABNORMAL HIGH (ref <150)
VLDL: 46 mg/dL — AB (ref 0–40)

## 2016-07-14 LAB — I-STAT TROPONIN, ED: Troponin i, poc: 0.01 ng/mL (ref 0.00–0.08)

## 2016-07-14 LAB — TROPONIN I: Troponin I: <0.03 ng/mL (ref <0.03)

## 2016-07-14 LAB — MRSA PCR SCREENING: MRSA BY PCR: NEGATIVE

## 2016-07-14 MED ORDER — LISINOPRIL-HYDROCHLOROTHIAZIDE 20-25 MG PO TABS: 1.0000 | ORAL_TABLET | Freq: Every day | ORAL | Status: DC

## 2016-07-14 MED ORDER — NITROGLYCERIN 0.4 MG SL SUBL
0.4000 mg | SUBLINGUAL_TABLET | SUBLINGUAL | Status: DC | PRN
  Administered 2016-07-14: 0.4 mg via SUBLINGUAL
  Filled 2016-07-14: qty 1

## 2016-07-14 MED ORDER — ACETAMINOPHEN 325 MG PO TABS
650.0000 mg | ORAL_TABLET | ORAL | Status: DC | PRN
  Administered 2016-07-16 (×2): 650 mg via ORAL
  Filled 2016-07-14 (×2): qty 2

## 2016-07-14 MED ORDER — MORPHINE SULFATE (PF) 2 MG/ML IV SOLN: 2.0000 mg | INTRAVENOUS | Status: DC | PRN

## 2016-07-14 MED ORDER — SODIUM CHLORIDE 0.9 % IV SOLN
INTRAVENOUS | Status: DC
  Administered 2016-07-14: 21:00:00 via INTRAVENOUS

## 2016-07-14 MED ORDER — GI COCKTAIL ~~LOC~~
30.0000 mL | Freq: Four times a day (QID) | ORAL | Status: DC | PRN
  Filled 2016-07-14: qty 30

## 2016-07-14 MED ORDER — NITROGLYCERIN 2 % TD OINT
1.0000 [in_us] | TOPICAL_OINTMENT | TRANSDERMAL | Status: AC
  Administered 2016-07-14: 1 [in_us] via TOPICAL
  Filled 2016-07-14: qty 1

## 2016-07-14 MED ORDER — ONDANSETRON HCL 4 MG/2ML IJ SOLN: 4.0000 mg | Freq: Four times a day (QID) | INTRAMUSCULAR | Status: DC | PRN

## 2016-07-14 MED ORDER — ASPIRIN EC 325 MG PO TBEC
325.0000 mg | DELAYED_RELEASE_TABLET | Freq: Every day | ORAL | Status: DC
  Administered 2016-07-14 – 2016-07-15 (×2): 325 mg via ORAL
  Filled 2016-07-14 (×2): qty 1

## 2016-07-14 MED ORDER — HYDRALAZINE HCL 10 MG PO TABS: 10.0000 mg | ORAL_TABLET | Freq: Three times a day (TID) | ORAL | Status: DC | PRN

## 2016-07-14 MED ORDER — HYDROCHLOROTHIAZIDE 25 MG PO TABS: 25.0000 mg | ORAL_TABLET | Freq: Every day | ORAL | Status: DC

## 2016-07-14 MED ORDER — ENOXAPARIN SODIUM 40 MG/0.4ML ~~LOC~~ SOLN
40.0000 mg | SUBCUTANEOUS | Status: DC
  Administered 2016-07-14 – 2016-07-15 (×2): 40 mg via SUBCUTANEOUS
  Filled 2016-07-14 (×2): qty 0.4

## 2016-07-14 MED ORDER — LISINOPRIL 20 MG PO TABS: 20.0000 mg | ORAL_TABLET | Freq: Every day | ORAL | Status: DC

## 2016-07-14 MED ORDER — ASPIRIN 81 MG PO CHEW: 324.0000 mg | CHEWABLE_TABLET | Freq: Once | ORAL | Status: DC

## 2016-07-14 MED ORDER — METOPROLOL TARTRATE 25 MG PO TABS
25.0000 mg | ORAL_TABLET | Freq: Two times a day (BID) | ORAL | Status: DC
  Administered 2016-07-14: 25 mg via ORAL
  Filled 2016-07-14: qty 1

## 2016-07-14 NOTE — ED Notes (Signed)
Per Irving BurtonEmily, [place] pt allowed to have fluids. Pt given ice water.

## 2016-07-14 NOTE — ED Notes (Signed)
ED Provider at bedside. 

## 2016-07-14 NOTE — ED Provider Notes (Signed)
[facility]-EMERGENCY DEPT Provider Note   CSN: [id number] Arrival date & time: 07/14/16  1629     History   Chief Complaint Chief Complaint  Patient presents with  . Chest Pain  . Dizziness    HPI Edward Farmer is a 56 y.o. male.  HPI   Pt with hx HTN p/w left sided chest pain and left arm tingling, associated SOB, nausea, lightheadedness that began this morning at 7am.  The symptoms are exacerbated with activity and better with rest.  Has HTN and took his home medication this morning.  Denies leg swelling, recent immobilization, hx blood clots.  No know family hx early CAD.    Past Medical History:  Diagnosis Date  . Asthma   . Hypertension     Patient Active Problem List   Diagnosis Date Noted  . Angina at rest [facility]) 07/14/2016  . Chronic hepatitis C without hepatic coma (HCC) 01/27/2014  . Asthma, chronic 01/27/2014  . Essential hypertension, benign 01/27/2014  . Obesity, unspecified 01/27/2014    History reviewed. No pertinent surgical history.     Home Medications    Prior to Admission medications   Medication Sig Start Date End Date Taking? Authorizing Provider  hydrochlorothiazide (HYDRODIURIL) 25 MG tablet Take 25 mg by mouth daily.   Yes Historical Provider, MD  ibuprofen (ADVIL,MOTRIN) 800 MG tablet Take 800 mg by mouth 3 (three) times daily as needed for headache (pain).   Yes Historical Provider, MD  lisinopril-hydrochlorothiazide (PRINZIDE,ZESTORETIC) 20-25 MG tablet Take 1 tablet by mouth daily.   Yes Historical Provider, MD    Family History No family history on file.  Social History Social History  Substance Use Topics  . Smoking status: Current Every Day Smoker    Packs/day: 0.25    Types: Cigarettes  . Smokeless tobacco: Never Used     Comment: cutting back  . Alcohol use 0.0 oz/week     Comment: occ     Allergies   Patient has no known allergies.   Review of Systems Review of Systems  All other systems reviewed and are  negative.    Physical Exam Updated Vital Signs BP (!) 153/110   Pulse 87   Temp 97.9 F (36.6 C) (Oral)   Resp 23   Ht 5\' 9"  (1.753 m)   Wt 95.3 kg   SpO2 96%   BMI 31.01 kg/m   Physical Exam  Constitutional: He appears well-developed and well-nourished. No distress.  HENT:  Head: Normocephalic and atraumatic.  Neck: Neck supple.  Cardiovascular: Normal rate and regular rhythm.   Pulmonary/Chest: Effort normal and breath sounds normal. No respiratory distress. He has no wheezes. He has no rales. He exhibits tenderness.  Abdominal: Soft. He exhibits no distension and no mass. There is no tenderness. There is no rebound and no guarding.  Musculoskeletal: He exhibits no edema.  Neurological: He is alert. He exhibits normal muscle tone.  Skin: He is not diaphoretic.  Nursing note and vitals reviewed.    ED Treatments / Results  Labs (all labs ordered are listed, but only abnormal results are displayed) Labs Reviewed  BASIC METABOLIC PANEL - Abnormal; Notable for the following:       Result Value   Creatinine, Ser 1.29 (*)    All other components within normal limits  CBC - Abnormal; Notable for the following:    WBC 10.7 (*)    Hemoglobin 12.4 (*)    HCT 37.6 (*)    All other components within  normal limits  HIV ANTIBODY (ROUTINE TESTING)  TROPONIN I  TROPONIN I  TROPONIN I  HEMOGLOBIN A1C  BASIC METABOLIC PANEL  CBC  LIPID PANEL  I-STAT TROPOININ, ED    EKG  EKG Interpretation  Date/Time:  Sunday July 14 2016 16:33:26 EDT Ventricular Rate:  82 PR Interval:    QRS Duration: 100 QT Interval:  366 QTC Calculation: 428 R Axis:   15 Text Interpretation:  Sinus rhythm Probable left atrial enlargement Probable anteroseptal infarct, old Confirmed by Juleen ChinaKOHUT  MD, STEPHEN [phone]([id number]) on 07/14/2016 5:37:04 PM       Radiology Dg Chest Portable 1 View  Result Date: 07/14/2016 CLINICAL DATA:  Left-sided chest pain with shortness of breath EXAM: PORTABLE CHEST 1 VIEW  COMPARISON:  07/22/2012 FINDINGS: 1735 hours. The cardio pericardial silhouette is enlarged. The lungs are clear wiithout focal pneumonia, edema, pneumothorax or pleural effusion. The visualized bony structures of the thorax are intact. Telemetry leads overlie the chest. IMPRESSION: Cardiomegaly without acute findings. Electronically Signed   By: Kennith CenterEric  Mansell M.D.   On: 07/14/2016 17:52    Procedures Procedures (including critical care time)  Medications Ordered in ED Medications  acetaminophen (TYLENOL) tablet 650 mg (not administered)  ondansetron (ZOFRAN) injection 4 mg (not administered)  enoxaparin (LOVENOX) injection 40 mg (not administered)  morphine 2 MG/ML injection 2 mg (not administered)  gi cocktail (Maalox,Lidocaine,Donnatal) (not administered)  metoprolol tartrate (LOPRESSOR) tablet 25 mg (not administered)  aspirin EC tablet 325 mg (not administered)  0.9 %  sodium chloride infusion (not administered)  lisinopril (PRINIVIL,ZESTRIL) tablet 20 mg (not administered)    And  hydrochlorothiazide (HYDRODIURIL) tablet 25 mg (not administered)  nitroGLYCERIN (NITROGLYN) 2 % ointment 1 inch (1 inch Topical Given 07/14/16 1947)     Initial Impression / Assessment and Plan / ED Course  I have reviewed the triage vital signs and the nursing notes.  Pertinent labs & imaging results that were available during my care of the patient were reviewed by me and considered in my medical decision making (see chart for details).  Clinical Course as of Jul 14 2048  Wynelle LinkSun Jul 14, 2016  1758 I spoke with Dr Donnie Ahoilley who recommends hospitalist admission.  They will consult if requested by hospitalist.    [EW]  [phone] Discussed pt with Dr Criselda PeachesMullen, [facility], who accepts patient for admission.    [EW]    Clinical Course User Index [EW] Trixie DredgeEmily Murry Diaz, PA-C    Pt with hx HTN, smoking p/w chest pain with concerning features.  HEART score is 4.  Admitted to [facility] as above.  Final  Clinical Impressions(s) / ED Diagnoses   Final diagnoses:  Chest pain, unspecified type  Hypertension, unspecified type    New Prescriptions Current Discharge Medication List       Trixie Dredgemily Marino Rogerson, PA-C 07/14/16 2050    Raeford RazorStephen Kohut, MD 07/15/16 1359

## 2016-07-14 NOTE — ED Triage Notes (Signed)
Pt presents via EMS c/o dizziness, CP, and high BP since this morning. Denies SOB, N/V. Skin warm/dry, A&Ox4. 324 ASA, 1 NTG en route. Per EMS, 12-lead unremarkable. Pt reports 6/10 CP. 20 G L hand. 160/108, HR 96, RR 16.

## 2016-07-14 NOTE — H&P (Signed)
History and Physical    Edward Farmer WUJ:[id number] DOB: 05/08/1960 DOA: 07/14/2016  PCP: [name],[name], NP Patient coming from: Home  Chief Complaint: Chest pain  HPI: Edward Farmer is a 56 y.o. male with medical history significant of HTN and asthma who presents with chest pain.  He reports that the pain came on this morning while eating breakfast.  It was sharp and left sided, 11/10.  It was associated with left arm numbness, nausea, SOB and sweating which worried him.  It improved with sitting still, but continued to occur throughout the morning which prompted him to come in and be evaluated. 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  He notes the pains are sharp.  Before today, it has not been associated with arm issues.  He denies every taking medication for the pain.  He is a current smoker, but is trying to cut back.  His father had an MI, he cannot remember at what age.  He has not taken aspirin.  He has also been having headaches associated with dizziness for a while now.   [name] has HTN as well and he has been seeing his PCP monthly for this. He notes that his SBP is always > 160 and that it sometimes runs even higher.  He has been taking lisinopril and hctz for this and reports compliance daily with the medication.  d  ED Course: IN the ED, he was found to have a normal troponin and EKG without acute changes.  CXR showed cardiomegaly.  He had continued chest pain, so nitropaste was applied.   Review of Systems: As per HPI otherwise 10 point review of systems negative.    Past Medical History:  Diagnosis Date  . Asthma   . Hypertension     History reviewed. No pertinent surgical history.  He has never had surgery.     reports that he has been smoking Cigarettes.  He has been smoking about 0.25 packs per day. He has never used smokeless tobacco. He reports that he drinks alcohol. He reports that he  does not use drugs.  Reports drinking 1-2 times per week, never withdrawn.   No Known Allergies  Reviewed with patient.  Family History  Problem Relation Age of Onset  . Migraines Mother   . Heart attack Father     Prior to Admission medications   Medication Sig Start Date End Date Taking? Authorizing Provider  hydrochlorothiazide (HYDRODIURIL) 25 MG tablet Take 25 mg by mouth daily.   Yes Historical Provider, MD  ibuprofen (ADVIL,MOTRIN) 800 MG tablet Take 800 mg by mouth 3 (three) times daily as needed for headache (pain).   Yes Historical Provider, MD  lisinopril-hydrochlorothiazide (PRINZIDE,ZESTORETIC) 20-25 MG tablet Take 1 tablet by mouth daily.   Yes Historical Provider, MD    Physical Exam: Vitals:   07/14/16 1940 07/14/16 1950 07/14/16 2030 07/14/16 2103  BP: (!) 170/116 (!) 153/110  (!) 150/92  Pulse: 84 87  73  Resp: 15 23    Temp:   97.7 F (36.5 C)   TempSrc:   Oral   SpO2: 98% 96%    Weight:      Height:        Constitutional: Somewhat short of breath, had an episode of chest pain while in room Vitals:   07/14/16 1940 07/14/16 1950 07/14/16 2030 07/14/16 2103  BP: (!) 170/116 (!) 153/110  [name](!)  150/92  Pulse: 84 87  73  Resp: 15 23    Temp:   97.7 F (36.5 C)   TempSrc:   Oral   SpO2: 98% 96%    Weight:      Height:       Eyes: PERRL, conjunctivae injected, no icterus ENMT: Mucous membranes are moist. Posterior pharynx clear of any exudate or lesions. Neck: normal, supple, no JVD Respiratory: clear to auscultation bilaterally, no wheezing, mild tachypnea Cardiovascular: normal rate and regular rhythm, no murmurs / rubs / gallops. No extremity edema.  Abdomen: no tenderness, no masses palpated.  Bowel sounds positive.  Musculoskeletal: no clubbing / cyanosis. No joint deformity upper and lower extremities. Normal muscle tone Skin: no rashes, lesions, ulcers on exposed skin Neurologic: Moving all extremities, speaking fluently.  Psychiatric: Normal  judgment and insight. Alert and oriented x 3. Normal mood.   Labs on Admission: I have personally reviewed following labs and imaging studies  CBC:  Recent Labs Lab 07/14/16 1642  WBC 10.7*  HGB 12.4*  HCT 37.6*  MCV 87.2  PLT 206   Basic Metabolic Panel:  Recent Labs Lab 07/14/16 1642  NA 141  K 3.7  CL 104  CO2 26  GLUCOSE 95  BUN 15  CREATININE 1.29*  CALCIUM 9.0   Cardiac Enzymes:  Recent Labs Lab 07/14/16 2045  TROPONINI <0.03   BNP (last 3 results) No results for input(s): PROBNP in the last 8760 hours. HbA1C: No results for input(s): HGBA1C in the last 72 hours. CBG: No results for input(s): GLUCAP in the last 168 hours. Lipid Profile:  Recent Labs  07/14/16 2045  CHOL 174  HDL 34*  LDLCALC 94  TRIG 829*  CHOLHDL 5.1   Radiological Exams on Admission: Dg Chest Portable 1 View  Result Date: 07/14/2016 CLINICAL DATA:  Left-sided chest pain with shortness of breath EXAM: PORTABLE CHEST 1 VIEW COMPARISON:  07/22/2012 FINDINGS: 1735 hours. The cardio pericardial silhouette is enlarged. The lungs are clear wiithout focal pneumonia, edema, pneumothorax or pleural effusion. The visualized bony structures of the thorax are intact. Telemetry leads overlie the chest. IMPRESSION: Cardiomegaly without acute findings. Electronically Signed   By: Kennith Center M.D.   On: 07/14/2016 17:52    EKG: Independently reviewed. NSR, no acute ST changes. Possible TWI in V6  MPS from 2002 showed no ischemic changes  Assessment/Plan Angina at rest - Admit to obs for chest pain evaluation - Cycle troponin X 3 - AM EKG - Telemetry - Nitropaste for chest pain - Morphine for severe pain - Monitor in SDU given ongoing pain - Possible stress test tomorrow - Cardiology evaluation in the AM if warranted - Beta blocker started - Zofran for nausea  Essential hypertension, benign - BP elevated, chronically per patient - Started beta blocker - Holding HCTZ and  lisinopril given AKI, restart in the AM if BP remains high  AKI (acute kidney injury) - IVF with NS at 100cc/hr for 10 hours - Check BMET In the AM - Hold nephrotoxic medications    Chronic hepatitis C without hepatic coma - Treated with harvoni, but does not appear to have gone back for re-evaluation - Check HCV status by PCP -  LFTs on AM labs.      DVT prophylaxis: Lovenox Code Status: Full Disposition Plan: Obs, chest pain work up, discharge tomorrow if does well Consults called: Cardiology by ED, will need to be called in the AM Admission status: Obs, SDU (active chest pain)  Debe Coder MD [facility] Pager [phone]  If 7PM-7AM, please contact night-coverage [email or web address] Password [facility]  07/14/2016, 9:50 PM

## 2016-07-14 NOTE — Progress Notes (Signed)
Notified on-call [facility] MD concerning pt remaining hypertensive since admission. Pt has remained hypertensive increasing as high as 180's systolic and there are no BP goals ordered at this time.   Asked if there could be PRN BP meds ordered and what the goal for pt's BP should be. Informed that pt's baseline at home averages in the 160's systolic. Per MD, attempt to keep pt <160's systolic.  Will continue to monitor closely.  Francia GreavesSavannah R Renea Schoonmaker, RN

## 2016-07-14 NOTE — ED Notes (Signed)
Admitting provider at bedside.

## 2016-07-14 NOTE — ED Notes (Signed)
Per EMS, pt denied SOB en route. Pt tachypneic with shallow respirations. Pt reports it "has been more tough to breathe" since this AM with the onset of his other sx. Breath sounds clear in all fields. Pt O2 sat 97%

## 2016-07-14 NOTE — ED Notes (Signed)
Pt given turkey sandwich and graham crackers 

## 2016-07-14 NOTE — ED Notes (Signed)
EDP aware of BP. 

## 2016-07-15 ENCOUNTER — Encounter (HOSPITAL_COMMUNITY): Payer: Self-pay | Admitting: Physician Assistant

## 2016-07-15 DIAGNOSIS — I10 Essential (primary) hypertension: Secondary | ICD-10-CM

## 2016-07-15 DIAGNOSIS — I208 Other forms of angina pectoris: Secondary | ICD-10-CM

## 2016-07-15 LAB — CBC
HEMATOCRIT: 36 % — AB (ref 39.0–52.0)
HEMOGLOBIN: 11.6 g/dL — AB (ref 13.0–17.0)
MCH: 28 pg (ref 26.0–34.0)
MCHC: 32.2 g/dL (ref 30.0–36.0)
MCV: 87 fL (ref 78.0–100.0)
Platelets: 195 10*3/uL (ref 150–400)
RBC: 4.14 MIL/uL — AB (ref 4.22–5.81)
RDW: 15.5 % (ref 11.5–15.5)
WBC: 10.6 10*3/uL — AB (ref 4.0–10.5)

## 2016-07-15 LAB — COMPREHENSIVE METABOLIC PANEL
ALBUMIN: 3.2 g/dL — AB (ref 3.5–5.0)
ALT: 22 U/L (ref 17–63)
AST: 24 U/L (ref 15–41)
Alkaline Phosphatase: 62 U/L (ref 38–126)
Anion gap: 9 (ref 5–15)
BILIRUBIN TOTAL: 0.7 mg/dL (ref 0.3–1.2)
BUN: 14 mg/dL (ref 6–20)
CO2: 23 mmol/L (ref 22–32)
CREATININE: 1.11 mg/dL (ref 0.61–1.24)
Calcium: 8.2 mg/dL — ABNORMAL LOW (ref 8.9–10.3)
Chloride: 106 mmol/L (ref 101–111)
GFR calc Af Amer: >60 mL/min (ref >60)
GFR calc non Af Amer: >60 mL/min (ref >60)
Glucose, Bld: 96 mg/dL (ref 65–99)
POTASSIUM: 3.2 mmol/L — AB (ref 3.5–5.1)
Sodium: 138 mmol/L (ref 135–145)
Total Protein: 6.5 g/dL (ref 6.5–8.1)

## 2016-07-15 LAB — HIV ANTIBODY (ROUTINE TESTING W REFLEX): HIV Screen 4th Generation wRfx: NONREACTIVE

## 2016-07-15 LAB — TROPONIN I
TROPONIN I: 0.03 ng/mL — AB (ref <0.03)
Troponin I: <0.03 ng/mL (ref <0.03)

## 2016-07-15 MED ORDER — SODIUM CHLORIDE 0.9% FLUSH
3.0000 mL | Freq: Two times a day (BID) | INTRAVENOUS | Status: DC
  Administered 2016-07-16: 3 mL via INTRAVENOUS

## 2016-07-15 MED ORDER — SODIUM CHLORIDE 0.9% FLUSH: 3.0000 mL | INTRAVENOUS | Status: DC | PRN

## 2016-07-15 MED ORDER — SODIUM CHLORIDE 0.9 % WEIGHT BASED INFUSION
1.0000 mL/kg/h | INTRAVENOUS | Status: DC
  Administered 2016-07-16: 1 mL/kg/h via INTRAVENOUS

## 2016-07-15 MED ORDER — ASPIRIN EC 81 MG PO TBEC
81.0000 mg | DELAYED_RELEASE_TABLET | Freq: Every day | ORAL | Status: DC
  Administered 2016-07-16: 81 mg via ORAL
  Filled 2016-07-15: qty 1

## 2016-07-15 MED ORDER — SODIUM CHLORIDE 0.9 % WEIGHT BASED INFUSION
3.0000 mL/kg/h | INTRAVENOUS | Status: AC
  Administered 2016-07-16: 3 mL/kg/h via INTRAVENOUS

## 2016-07-15 MED ORDER — SODIUM CHLORIDE 0.9 % IV SOLN: 250.0000 mL | INTRAVENOUS | Status: DC | PRN

## 2016-07-15 MED ORDER — POTASSIUM CHLORIDE CRYS ER 20 MEQ PO TBCR
40.0000 meq | EXTENDED_RELEASE_TABLET | Freq: Once | ORAL | Status: AC
  Administered 2016-07-15: 40 meq via ORAL
  Filled 2016-07-15: qty 2

## 2016-07-15 MED ORDER — ISOSORBIDE DINITRATE 5 MG PO TABS
5.0000 mg | ORAL_TABLET | Freq: Three times a day (TID) | ORAL | Status: DC
  Administered 2016-07-15 (×3): 5 mg via ORAL
  Filled 2016-07-15 (×5): qty 1

## 2016-07-15 MED ORDER — HYDRALAZINE HCL 10 MG PO TABS
10.0000 mg | ORAL_TABLET | Freq: Three times a day (TID) | ORAL | Status: DC
  Administered 2016-07-15 – 2016-07-16 (×5): 10 mg via ORAL
  Filled 2016-07-15 (×5): qty 1

## 2016-07-15 MED ORDER — AMLODIPINE BESYLATE 10 MG PO TABS
10.0000 mg | ORAL_TABLET | Freq: Every day | ORAL | Status: DC
  Administered 2016-07-15 – 2016-07-16 (×2): 10 mg via ORAL
  Filled 2016-07-15 (×2): qty 1

## 2016-07-15 MED ORDER — ASPIRIN 81 MG PO CHEW
81.0000 mg | CHEWABLE_TABLET | ORAL | Status: AC
  Administered 2016-07-16: 81 mg via ORAL
  Filled 2016-07-15: qty 1

## 2016-07-15 NOTE — Consult Note (Signed)
CARDIOLOGY CONSULT NOTE   Patient ID: Edward Farmer MRN: [id number] DOB/AGE: October 24, 1960 56 y.o.  Admit date: 07/14/2016  Primary Physician   Jacklynn Barnacle, NP Primary Cardiologist   New to Dr. Allyson Sabal Reason for Consultation   Chest pain and abnormal EKG Requesting Physician  Dr. Sharon Seller  HPI: Edward Farmer is a 56 y.o. male with a history of uncontrolled hypertension, ongoing tobacco abuse, chronic hepatitis C and asthma presented to [facility] ER 07/14/16 for evaluation of chest pain.  History of tobacco smoking for at least 10 year. He used to smoke 2 packs a day. Trying to quit. Currently smoking 2-5 cigarettes a day. Uncontrolled blood pressure for at least 5 years. Father had a MI at age 61 and died of this. He works for a [employer] and has not had substernal chest pressure with shortness of breath with pulse mover. Symptoms resolved with rest. Admits orthopnea and PND. He is scheduled for sleep study as an outpatient. Patient denies palpitation, dizziness, syncope, lower extremity edema, melena or blood in his stool or urine.  Yesterday patient had a left-sided sharp chest pain while cooking meal. Associated with severe shortness of breath with left from numbness and diaphoresis. His symptoms recently worsen over the past couple of weeks. Due to ongoing symptoms and worsening episode last evening he came to ER for further evaluation. Symptoms did not improve with sublingual nitroglycerin. Upon presentation blood pressure was 182/119. Started on metoprolol however developed bradycardia to high 30s. Now discontinued with improvement of rate. Home is HCTZ/lisinopril held due to AKI.   Troponin less than 0.03 x 2 --> 0.03. EKG upon presentation shows sinus rhythm at rate of 82 bpm, Q waves in anterior lead, T-wave inversion in lead V6. Repeat EKG this morning shows new T-wave inversion in inferior lateral lead. This appears similar to EKG of 07/2012.  He is chest pain-free  since admission. However complains of intermittent dyspnea. Chest x-ray shows cardiomegaly without acute finding.  07/14/2016: Cholesterol 174; HDL 34; LDL Cholesterol 94; Triglycerides 231; VLDL 46  K of 3.2  Past Medical History:  Diagnosis Date  . Asthma   . Hypertension      History reviewed. No pertinent surgical history.  No Known Allergies  I have reviewed the patient's current medications . amLODipine  10 mg Oral Daily  . [START ON 07/16/2016] aspirin EC  81 mg Oral Daily  . enoxaparin (LOVENOX) injection  40 mg Subcutaneous Q24H  . hydrALAZINE  10 mg Oral Q8H  . isosorbide dinitrate  5 mg Oral TID    acetaminophen, gi cocktail, morphine injection, ondansetron (ZOFRAN) IV  Prior to Admission medications   Medication Sig Start Date End Date Taking? Authorizing Provider  hydrochlorothiazide (HYDRODIURIL) 25 MG tablet Take 25 mg by mouth daily.   Yes Historical Provider, MD  ibuprofen (ADVIL,MOTRIN) 800 MG tablet Take 800 mg by mouth 3 (three) times daily as needed for headache (pain).   Yes Historical Provider, MD  lisinopril-hydrochlorothiazide (PRINZIDE,ZESTORETIC) 20-25 MG tablet Take 1 tablet by mouth daily.   Yes Historical Provider, MD     Social History   Social History  . Marital status: Legally Separated    Spouse name: N/A  . Number of children: N/A  . Years of education: N/A   Occupational History  . Not on file.   Social History Main Topics  . Smoking status: Current Every Day Smoker    Packs/day: 0.25    Types: Cigarettes  .  Smokeless tobacco: Never Used     Comment: cutting back  . Alcohol use 0.0 oz/week     Comment: occ  . Drug use: No  . Sexual activity: Not on file   Other Topics Concern  . Not on file   Social History Narrative  . No narrative on file    Family Status  Relation Status  . Mother   . Father    Family History  Problem Relation Age of Onset  . Migraines Mother   . Heart attack Father     ROS:  Full 14 point  review of systems complete and found to be negative unless listed above.  Physical Exam: Blood pressure (!) 170/124, pulse 86, temperature 97.5 F (36.4 C), temperature source Oral, resp. rate (!) 30, height 5\' 9"  (1.753 m), weight 210 lb (95.3 kg), SpO2 92 %.  General: Well developed, well nourished, male in no acute distress Head: Eyes PERRLA, No xanthomas. Normocephalic and atraumatic, oropharynx without edema or exudate.  Lungs: Resp regular and unlabored. Course breath sound with scattered wheezing at base.  Heart: RRR no s3, s4, or murmurs..   Neck: No carotid bruits. No lymphadenopathy.  No JVD. Abdomen: Bowel sounds present, abdomen soft and non-tender without masses or hernias noted. Msk:  No spine or cva tenderness. No weakness, no joint deformities or effusions. Extremities: No clubbing, cyanosis or edema. DP/PT/Radials 2+ and equal bilaterally. Neuro: Alert and oriented X 3. No focal deficits noted. Psych:  Good affect, responds appropriately Skin: No rashes or lesions noted.  Labs:   Lab Results  Component Value Date   WBC 10.6 (H) 07/15/2016   HGB 11.6 (L) 07/15/2016   HCT 36.0 (L) 07/15/2016   MCV 87.0 07/15/2016   PLT 195 07/15/2016   No results for input(s): INR in the last 72 hours.  Recent Labs Lab 07/15/16 0225  NA 138  K 3.2*  CL 106  CO2 23  BUN 14  CREATININE 1.11  CALCIUM 8.2*  PROT 6.5  BILITOT 0.7  ALKPHOS 62  ALT 22  AST 24  GLUCOSE 96  ALBUMIN 3.2*   No results found for: MG  Recent Labs  07/14/16 2045 07/14/16 2336 07/15/16 0225  TROPONINI <0.03 <0.03 0.03*    Recent Labs  07/14/16 1646  TROPIPOC 0.01   No results found for: PROBNP Lab Results  Component Value Date   CHOL 174 07/14/2016   HDL 34 (L) 07/14/2016   LDLCALC 94 07/14/2016   TRIG 231 (H) 07/14/2016   No results found for: DDIMER No results found for: LIPASE, AMYLASE TSH  Date/Time Value Ref Range Status  07/29/2007 08:51 PM 0.856 0.350 - 5.50 microintl  units/mL Final    Comment:    See lab report for associated comment(s)   Iron  Date/Time Value Ref Range Status  12/28/2013 11:26 AM 67 42 - 165 ug/dL Final    Radiology:  Dg Chest Portable 1 View  Result Date: 07/14/2016 CLINICAL DATA:  Left-sided chest pain with shortness of breath EXAM: PORTABLE CHEST 1 VIEW COMPARISON:  07/22/2012 FINDINGS: 1735 hours. The cardio pericardial silhouette is enlarged. The lungs are clear wiithout focal pneumonia, edema, pneumothorax or pleural effusion. The visualized bony structures of the thorax are intact. Telemetry leads overlie the chest. IMPRESSION: Cardiomegaly without acute findings. Electronically Signed   By: Kennith CenterEric  Mansell M.D.   On: 07/14/2016 17:52    ASSESSMENT AND PLAN:     1. Chest pain - Has both typical and atypical  features. Patient has a long-standing history of chest pain in setting of elevated blood pressure. EKG shows new T-wave inversion in inferior lateral leads this morning. This was present in EKG of 2014. ? LVH. His cardiac risk factors includes family history of CAD, uncontrolled blood pressure, ongoing tobacco smoking and possible sleep apnea. - Continue aspirin. Will plan for cath tomorrow.   2. Uncontrolled hypertension - Long-standing history. Continue hydralazine, isosorbide and Norvasc. Up titrate as needed. AKI has been resolved. May consider resuming home with medicine post cath.   3. Dyspnea - This been intermittent. He is not volume overloaded on exam. However distended abdomen. CXR without acute findings. CXR without acute findings. Seems chronic diastolic dysfunction given uncontrolled blood pressure. Had has orthopnea and PND. Likely due to possible sleep apnea. He schedule for sleep study as outpatient.   4. Ongoing tobacco abuse - He is trying to quit. Encourage complete cessation. Education given.  5. Hypokalemia - supplement given  6. Bradycardia - occurred on BB. Rate improved with discontinuation.    7. AKI - As above   Signed: Bhagat,Bhavinkumar, PA 07/15/2016, 12:48 PM Pager [phone]  Co-Sign MD   Agree with note by Chelsea Aus PA-C  56 year old mildly overweight African-American male admitted with unstable angina. He has positive cardiac risk factors including uncontrolled hypertension, tobacco abuse and family history. He's had effort angina for several years and episode of chest pain that was unprovoked and spontaneous. His EKG shows LVH with repolarization changes versus lateral T-wave inversion. His enzymes are negative. Exam is benign. I believe he would benefit from left heart cath to define his anatomy.The patient understands that risks included but are not limited to stroke (1 in 1000), death (1 in 1000), kidney failure [usually temporary] (1 in 500), bleeding (1 in 200), allergic reaction [possibly serious] (1 in 200). The patient understands and agrees to proceed   Runell Gess, M.D., FACP, [facility], Kathryne Eriksson [facility] [address] [place], [place]  [address]  [phone] 07/15/2016 1:31 PM

## 2016-07-15 NOTE — Progress Notes (Signed)
[place] TEAM 1 - Stepdown/ICU TEAM  Edward Farmer  ZOX:[id number] DOB: 31-Oct-1960 DOA: 07/14/2016 PCP: Jacklynn Barnacle, NP    Brief Narrative:  56 y.o. male with history of HTN, smoking, and asthma who presented with chest pain that came on while eating breakfast.  It was sharp and left sided, and associated with left arm numbness, nausea, SOB, and sweating.  It continued throughout the morning which prompted him to come in. He has a 2 year history of intermittent sharp chest pains while working [employer], [employer]) which improves with rest.  His father had an MI at 38yo.   Subjective: Pt tells me he has had 2+ weeks of intermittent sharp but L sided chest pain with tingling in his L arm which comes on w/ exertion and gets better w/ rest.    Assessment & Plan:  Chest pain at rest  troponin X 3 not signif elevated - hx varies, but some sx worrisome for unstable angina - EKG has noted some T wave inversions - in pt w/ HTN, unknown lipid status, tobacco abuse, and + family hx will ask Cards to see for possible intpt risk stratification   Recent Labs Lab 07/14/16 2045 07/14/16 2336 07/15/16 0225  TROPONINI <0.03 <0.03 0.03*    Bradycardia HR dipped into 40s yesterday/last night - BB (new this admit) stopped - appears to have resolved w/ d/c of BB   Essential hypertension BP elevated chronically per patient - holding HCTZ and lisinopril given AKI - avoid acute overcorrection as this is likely a longstanding issue - add Norvasc - follow   Acute kidney injury Holding ACE and diuretic - essentially resolved - follow   Recent Labs Lab 07/14/16 1642 07/15/16 0225  CREATININE 1.29* 1.11    Mild Hypokalemia Due to diuretic - replace and follow   Normocytic anemia  Check anemia panel - check stool guiac - will need colonoscopy as outpt   Chronic hepatitis C without hepatic coma -Treated with harvoni, but does not appear to have gone back for re-evaluation -Check HCV status in f/u w/  PCP  DVT prophylaxis: lovenox  Code Status: FULL CODE Family Communication: no family present at time of exam  Disposition Plan: await Cards eval   Consultants:  [facility]   Procedures: none  Antimicrobials:  none   Objective: Blood pressure (!) 182/110, pulse 75, temperature 97.8 F (36.6 C), temperature source Oral, resp. rate 13, height 5\' 9"  (1.753 m), weight 95.3 kg (210 lb), SpO2 96 %.  Intake/Output Summary (Last 24 hours) at 07/15/16 1016 Last data filed at 07/15/16 0900  Gross per 24 hour  Intake             1115 ml  Output              950 ml  Net              165 ml   Filed Weights   07/14/16 1634  Weight: 95.3 kg (210 lb)    Examination: General: No acute respiratory distress Lungs: Clear to auscultation bilaterally without wheezes or crackles Cardiovascular: Regular rate and rhythm without murmur gallop or rub normal S1 and S2 Abdomen: Nontender, nondistended, soft, bowel sounds positive, no rebound, no ascites, no appreciable mass Extremities: No significant cyanosis, clubbing, or edema bilateral lower extremities  CBC:  Recent Labs Lab 07/14/16 1642 07/15/16 0225  WBC 10.7* 10.6*  HGB 12.4* 11.6*  HCT 37.6* 36.0*  MCV 87.2 87.0  PLT 206 195  Basic Metabolic Panel:  Recent Labs Lab 07/14/16 1642 07/15/16 0225  NA 141 138  K 3.7 3.2*  CL 104 106  CO2 26 23  GLUCOSE 95 96  BUN 15 14  CREATININE 1.29* 1.11  CALCIUM 9.0 8.2*   GFR: Estimated Creatinine Clearance: 84.6 mL/min (by C-G formula based on SCr of 1.11 mg/dL).  Liver Function Tests:  Recent Labs Lab 07/15/16 0225  AST 24  ALT 22  ALKPHOS 62  BILITOT 0.7  PROT 6.5  ALBUMIN 3.2*    Cardiac Enzymes:  Recent Labs Lab 07/14/16 2045 07/14/16 2336 07/15/16 0225  TROPONINI <0.03 <0.03 0.03*    Recent Results (from the past 240 hour(s))  MRSA PCR Screening     Status: None   Collection Time: 07/14/16  9:22 PM  Result Value Ref Range Status   MRSA by PCR  NEGATIVE NEGATIVE Final    Comment:        The GeneXpert MRSA Assay (FDA approved for NASAL specimens only), is one component of a comprehensive MRSA colonization surveillance program. It is not intended to diagnose MRSA infection nor to guide or monitor treatment for MRSA infections.      Scheduled Meds: . amLODipine  10 mg Oral Daily  . aspirin EC  325 mg Oral Daily  . enoxaparin (LOVENOX) injection  40 mg Subcutaneous Q24H    LOS: 0 days   Lonia BloodJeffrey T. Jeter Tomey, MD [facility] Office  [phone] Pager - Text Page per Amion as per below:  On-Call/Text Page:      [email or web address]      password [email or web address]  If 7PM-7AM, please contact night-coverage [email or web address] Password [email or web address] 07/15/2016, 10:16 AM

## 2016-07-15 NOTE — Progress Notes (Signed)
Pt remains in intermittent bradycardia that sustains for about 10 seconds (give or take). MD aware and informed RN that no further interventions can be given at this time - if pt asymptomatic, simply continue to monitor and inform if pt status changes.  Francia GreavesSavannah R Tyvon Eggenberger, RN

## 2016-07-15 NOTE — Progress Notes (Signed)
Patient reports he still has questions regarding cardiac catheterization. PA notified.

## 2016-07-15 NOTE — Progress Notes (Signed)
Patient's apnea alarm intermittently activated while sleeping. Sats stable, mid 90s-100%. Patient reports he is currently being evaluated for sleep apnea and [facility]

## 2016-07-15 NOTE — Progress Notes (Addendum)
Called by nursing, Savannah[facility] the 40[facility], lowest being 46.  This has happened about 6 times tonight, and otherwise returns to the 50s.  Patient complains of dizziness, but was also having dizziness with his chest pain.    Will plan to D/C nitropaste.  Check EKG for AV blockade.  He is beta blocker naive and received metoprolol 25mg  today for BP control and chest pain.  Would hold beta blocker in the AM and consider restarting at a lower dose.    UPDATE: EKG resulted, bradycardic to 57 which is only mildly low.  However, now with TWI in the infero-lateral leads.  Most recent troponin < 0.03.  Next due tomorrow morning.  It is my understanding that main symptom currently is dizziness.  If he has recurrent chest pain, would get troponin early.    Debe CoderMULLEN, Cale Decarolis, MD

## 2016-07-15 NOTE — Progress Notes (Signed)
CRITICAL VALUE ALERT  Critical value received:  troponins 0.03  Date of notification:  07/15/2016  Time of notification:  0340  Critical value read back: yes  Nurse who received alert:  Antonieta IbaSavannah Cahterine Heinzel, RN  MD notified (1st page):  [facility]  Time of first page:  [phone]  MD notified (2nd page): [facility]  Time of second page: 0425  Responding MD:    Time MD responded:    Pt symptomatic; denying chest pain or discomfort. No new orders received.   Will continue to monitor closely.  Francia GreavesSavannah R Anabia Weatherwax, RN

## 2016-07-15 NOTE — Progress Notes (Addendum)
Patient has multiple questions regarding cardiac catheterization. All questions answered. The patient understands that risks include but are not limited to stroke (1 in 1000), death (1 in 1000), kidney failure [usually temporary] (1 in 500), bleeding (1 in 200), allergic reaction [possibly serious] (1 in 200), and agrees to proceed.

## 2016-07-15 NOTE — Progress Notes (Signed)
Removed nitro (paste/patch) from pt's chest after receiving verbal order from MD.  Will continue to monitor closely.  Francia GreavesSavannah R Deno Sida, RN

## 2016-07-16 ENCOUNTER — Inpatient Hospital Stay (HOSPITAL_COMMUNITY): Payer: Self-pay

## 2016-07-16 ENCOUNTER — Inpatient Hospital Stay (HOSPITAL_COMMUNITY)
Admission: EM | Disposition: A | Discharge status: Home / Self Care | Payer: Self-pay | Source: Home / Self Care | Attending: Cardiothoracic Surgery

## 2016-07-16 ENCOUNTER — Other Ambulatory Visit: Payer: Self-pay | Admitting: *Deleted

## 2016-07-16 ENCOUNTER — Encounter (HOSPITAL_COMMUNITY): Payer: Self-pay | Admitting: Cardiology

## 2016-07-16 DIAGNOSIS — Z01810 Encounter for preprocedural cardiovascular examination: Secondary | ICD-10-CM

## 2016-07-16 DIAGNOSIS — I25110 Atherosclerotic heart disease of native coronary artery with unstable angina pectoris: Principal | ICD-10-CM

## 2016-07-16 DIAGNOSIS — I2510 Atherosclerotic heart disease of native coronary artery without angina pectoris: Secondary | ICD-10-CM

## 2016-07-16 DIAGNOSIS — Z8249 Family history of ischemic heart disease and other diseases of the circulatory system: Secondary | ICD-10-CM

## 2016-07-16 DIAGNOSIS — I10 Essential (primary) hypertension: Secondary | ICD-10-CM

## 2016-07-16 HISTORY — PX: LEFT HEART CATH AND CORONARY ANGIOGRAPHY: CATH118249

## 2016-07-16 LAB — URINALYSIS, ROUTINE W REFLEX MICROSCOPIC
Bacteria, UA: NONE SEEN
Bilirubin Urine: NEGATIVE
Glucose, UA: NEGATIVE mg/dL
Ketones, ur: NEGATIVE mg/dL
Leukocytes, UA: NEGATIVE
Nitrite: NEGATIVE
Protein, ur: NEGATIVE mg/dL
Specific Gravity, Urine: 1.012 (ref 1.005–1.030)
Squamous Epithelial / LPF: NONE SEEN
pH: 7 (ref 5.0–8.0)

## 2016-07-16 LAB — VAS US DOPPLER PRE CABG
LEFT ECA DIAS: -10 cm/s
LEFT VERTEBRAL DIAS: 3 cm/s
Left CCA dist dias: 20 cm/s
Left CCA dist sys: 71 cm/s
Left CCA prox dias: 23 cm/s
Left CCA prox sys: 97 cm/s
Left ICA dist dias: -28 cm/s
Left ICA dist sys: -78 cm/s
Left ICA prox dias: -35 cm/s
Left ICA prox sys: -83 cm/s
RIGHT ECA DIAS: -10 cm/s
RIGHT VERTEBRAL DIAS: 12 cm/s
Right CCA prox dias: 9 cm/s
Right CCA prox sys: 80 cm/s
Right cca dist sys: -53 cm/s

## 2016-07-16 LAB — VITAMIN B12: VITAMIN B 12: 253 pg/mL (ref 180–914)

## 2016-07-16 LAB — BASIC METABOLIC PANEL
Anion gap: 9 (ref 5–15)
BUN: 12 mg/dL (ref 6–20)
CO2: 23 mmol/L (ref 22–32)
CREATININE: 1.04 mg/dL (ref 0.61–1.24)
Calcium: 8.6 mg/dL — ABNORMAL LOW (ref 8.9–10.3)
Chloride: 107 mmol/L (ref 101–111)
GFR calc Af Amer: >60 mL/min (ref >60)
Glucose, Bld: 99 mg/dL (ref 65–99)
Potassium: 3.3 mmol/L — ABNORMAL LOW (ref 3.5–5.1)
SODIUM: 139 mmol/L (ref 135–145)

## 2016-07-16 LAB — CBC
HCT: 38.5 % — ABNORMAL LOW (ref 39.0–52.0)
Hemoglobin: 12.5 g/dL — ABNORMAL LOW (ref 13.0–17.0)
MCH: 28.1 pg (ref 26.0–34.0)
MCHC: 32.5 g/dL (ref 30.0–36.0)
MCV: 86.5 fL (ref 78.0–100.0)
PLATELETS: 206 10*3/uL (ref 150–400)
RBC: 4.45 MIL/uL (ref 4.22–5.81)
RDW: 15.1 % (ref 11.5–15.5)
WBC: 10.7 10*3/uL — AB (ref 4.0–10.5)

## 2016-07-16 LAB — ECHOCARDIOGRAM COMPLETE
Height: 69 in
Weight: 3361.57 oz

## 2016-07-16 LAB — TYPE AND SCREEN
ABO/RH(D): O POS
Antibody Screen: NEGATIVE

## 2016-07-16 LAB — IRON AND TIBC
Iron: 46 ug/dL (ref 45–182)
Saturation Ratios: 13 % — ABNORMAL LOW (ref 17.9–39.5)
TIBC: 351 ug/dL (ref 250–450)
UIBC: 305 ug/dL

## 2016-07-16 LAB — HEMOGLOBIN A1C
Hgb A1c MFr Bld: 5.8 % — ABNORMAL HIGH (ref 4.8–5.6)
Mean Plasma Glucose: 120 mg/dL

## 2016-07-16 LAB — SURGICAL PCR SCREEN
MRSA, PCR: NEGATIVE
Staphylococcus aureus: NEGATIVE

## 2016-07-16 LAB — FERRITIN: Ferritin: 186 ng/mL (ref 24–336)

## 2016-07-16 LAB — RETICULOCYTES
RBC.: 4.45 MIL/uL (ref 4.22–5.81)
RETIC CT PCT: 2.4 % (ref 0.4–3.1)
Retic Count, Absolute: 106.8 10*3/uL (ref 19.0–186.0)

## 2016-07-16 LAB — ABO/RH: ABO/RH(D): O POS

## 2016-07-16 LAB — FOLATE: Folate: 11 ng/mL (ref >5.9)

## 2016-07-16 LAB — PROTIME-INR
INR: 1.01
PROTHROMBIN TIME: 13.3 s (ref 11.4–15.2)

## 2016-07-16 SURGERY — LEFT HEART CATH AND CORONARY ANGIOGRAPHY

## 2016-07-16 MED ORDER — PROPOFOL 10 MG/ML IV BOLUS
INTRAVENOUS | Status: AC
  Filled 2016-07-16: qty 20

## 2016-07-16 MED ORDER — CHLORHEXIDINE GLUCONATE CLOTH 2 % EX PADS
6.0000 | MEDICATED_PAD | Freq: Once | CUTANEOUS | Status: AC
  Administered 2016-07-16: 6 via TOPICAL

## 2016-07-16 MED ORDER — TRANEXAMIC ACID (OHS) BOLUS VIA INFUSION
15.0000 mg/kg | INTRAVENOUS | Status: AC
  Administered 2016-07-17: 1429.5 mg via INTRAVENOUS
  Filled 2016-07-16: qty 1430

## 2016-07-16 MED ORDER — DEXTROSE 5 % IV SOLN
750.0000 mg | INTRAVENOUS | Status: DC
  Filled 2016-07-16: qty 750

## 2016-07-16 MED ORDER — FENTANYL CITRATE (PF) 100 MCG/2ML IJ SOLN
INTRAMUSCULAR | Status: DC | PRN
  Administered 2016-07-16: 25 ug via INTRAVENOUS

## 2016-07-16 MED ORDER — NITROGLYCERIN IN D5W 200-5 MCG/ML-% IV SOLN
INTRAVENOUS | Status: AC
  Filled 2016-07-16: qty 250

## 2016-07-16 MED ORDER — HYDRALAZINE HCL 20 MG/ML IJ SOLN
INTRAMUSCULAR | Status: AC
  Filled 2016-07-16: qty 1

## 2016-07-16 MED ORDER — HYDRALAZINE HCL 20 MG/ML IJ SOLN
INTRAMUSCULAR | Status: DC | PRN
  Administered 2016-07-16 (×2): 10 mg via INTRAVENOUS

## 2016-07-16 MED ORDER — SODIUM CHLORIDE 0.9 % IV SOLN
30.0000 ug/min | INTRAVENOUS | Status: DC
  Filled 2016-07-16: qty 2

## 2016-07-16 MED ORDER — NITROGLYCERIN IN D5W 200-5 MCG/ML-% IV SOLN
INTRAVENOUS | Status: DC | PRN
  Administered 2016-07-16: 10 ug/min via INTRAVENOUS

## 2016-07-16 MED ORDER — LIDOCAINE 2% (20 MG/ML) 5 ML SYRINGE
INTRAMUSCULAR | Status: AC
  Filled 2016-07-16: qty 5

## 2016-07-16 MED ORDER — FUROSEMIDE 10 MG/ML IJ SOLN
INTRAMUSCULAR | Status: AC
  Filled 2016-07-16: qty 4

## 2016-07-16 MED ORDER — DEXTROSE 5 % IV SOLN
1.5000 g | INTRAVENOUS | Status: DC
  Filled 2016-07-16: qty 1.5

## 2016-07-16 MED ORDER — SUCCINYLCHOLINE CHLORIDE 200 MG/10ML IV SOSY
PREFILLED_SYRINGE | INTRAVENOUS | Status: AC
  Filled 2016-07-16: qty 10

## 2016-07-16 MED ORDER — POTASSIUM CHLORIDE 2 MEQ/ML IV SOLN
80.0000 meq | INTRAVENOUS | Status: DC
  Filled 2016-07-16: qty 40

## 2016-07-16 MED ORDER — HEPARIN (PORCINE) IN NACL 100-0.45 UNIT/ML-% IJ SOLN
1400.0000 [IU]/h | INTRAMUSCULAR | Status: DC
  Administered 2016-07-16: 1200 [IU]/h via INTRAVENOUS
  Filled 2016-07-16: qty 250

## 2016-07-16 MED ORDER — SODIUM CHLORIDE 0.9 % IV SOLN
INTRAVENOUS | Status: DC
  Filled 2016-07-16: qty 30

## 2016-07-16 MED ORDER — EPINEPHRINE PF 1 MG/ML IJ SOLN
0.0000 ug/min | INTRAVENOUS | Status: DC
  Filled 2016-07-16: qty 4

## 2016-07-16 MED ORDER — IOPAMIDOL (ISOVUE-370) INJECTION 76%
INTRAVENOUS | Status: DC | PRN
  Administered 2016-07-16: 90 mL via INTRA_ARTERIAL

## 2016-07-16 MED ORDER — FUROSEMIDE 10 MG/ML IJ SOLN
INTRAMUSCULAR | Status: DC | PRN
  Administered 2016-07-16: 40 mg via INTRAVENOUS

## 2016-07-16 MED ORDER — METOPROLOL TARTRATE 12.5 MG HALF TABLET
12.5000 mg | ORAL_TABLET | Freq: Once | ORAL | Status: DC
  Filled 2016-07-16: qty 1

## 2016-07-16 MED ORDER — IOPAMIDOL (ISOVUE-370) INJECTION 76%
INTRAVENOUS | Status: AC
  Filled 2016-07-16: qty 100

## 2016-07-16 MED ORDER — TRANEXAMIC ACID (OHS) BOLUS VIA INFUSION
15.0000 mg/kg | INTRAVENOUS | Status: DC
  Filled 2016-07-16: qty 1430

## 2016-07-16 MED ORDER — TRANEXAMIC ACID 1000 MG/10ML IV SOLN
1.5000 mg/kg/h | INTRAVENOUS | Status: AC
  Administered 2016-07-17: 1.5 mg/kg/h via INTRAVENOUS
  Filled 2016-07-16: qty 25

## 2016-07-16 MED ORDER — HEPARIN (PORCINE) IN NACL 2-0.9 UNIT/ML-% IJ SOLN
INTRAMUSCULAR | Status: DC | PRN
  Administered 2016-07-16: 1000 mL

## 2016-07-16 MED ORDER — EPHEDRINE 5 MG/ML INJ
INTRAVENOUS | Status: AC
  Filled 2016-07-16: qty 10

## 2016-07-16 MED ORDER — MIDAZOLAM HCL 10 MG/2ML IJ SOLN
INTRAMUSCULAR | Status: AC
  Filled 2016-07-16: qty 2

## 2016-07-16 MED ORDER — SODIUM CHLORIDE 0.9 % IV SOLN
INTRAVENOUS | Status: DC
  Filled 2016-07-16: qty 2.5

## 2016-07-16 MED ORDER — CEFUROXIME SODIUM 1.5 G IJ SOLR
1.5000 g | INTRAMUSCULAR | Status: DC
  Filled 2016-07-16: qty 1.5

## 2016-07-16 MED ORDER — VANCOMYCIN HCL 10 G IV SOLR
1500.0000 mg | INTRAVENOUS | Status: DC
  Filled 2016-07-16: qty 1500

## 2016-07-16 MED ORDER — NITROGLYCERIN IN D5W 200-5 MCG/ML-% IV SOLN
2.0000 ug/min | INTRAVENOUS | Status: DC
  Filled 2016-07-16: qty 250

## 2016-07-16 MED ORDER — DOPAMINE-DEXTROSE 3.2-5 MG/ML-% IV SOLN
0.0000 ug/kg/min | INTRAVENOUS | Status: DC
  Filled 2016-07-16: qty 250

## 2016-07-16 MED ORDER — PLASMA-LYTE 148 IV SOLN
INTRAVENOUS | Status: AC
  Administered 2016-07-17: 500 mL
  Filled 2016-07-16: qty 2.5

## 2016-07-16 MED ORDER — NITROGLYCERIN IN D5W 200-5 MCG/ML-% IV SOLN: 0.0000 ug/min | INTRAVENOUS | Status: DC

## 2016-07-16 MED ORDER — ROCURONIUM BROMIDE 50 MG/5ML IV SOSY
PREFILLED_SYRINGE | INTRAVENOUS | Status: AC
  Filled 2016-07-16: qty 5

## 2016-07-16 MED ORDER — BISACODYL 5 MG PO TBEC: 5.0000 mg | DELAYED_RELEASE_TABLET | Freq: Once | ORAL | Status: DC

## 2016-07-16 MED ORDER — FENTANYL CITRATE (PF) 250 MCG/5ML IJ SOLN
INTRAMUSCULAR | Status: AC
  Filled 2016-07-16: qty 20

## 2016-07-16 MED ORDER — PLASMA-LYTE 148 IV SOLN
INTRAVENOUS | Status: DC
  Filled 2016-07-16: qty 2.5

## 2016-07-16 MED ORDER — PHENYLEPHRINE 40 MCG/ML (10ML) SYRINGE FOR IV PUSH (FOR BLOOD PRESSURE SUPPORT)
PREFILLED_SYRINGE | INTRAVENOUS | Status: AC
  Filled 2016-07-16: qty 10

## 2016-07-16 MED ORDER — MIDAZOLAM HCL 2 MG/2ML IJ SOLN
INTRAMUSCULAR | Status: AC
Start: 2016-07-16 — End: 2016-07-16
  Filled 2016-07-16: qty 2

## 2016-07-16 MED ORDER — VERAPAMIL HCL 2.5 MG/ML IV SOLN
INTRAVENOUS | Status: DC | PRN
  Administered 2016-07-16: 10 mL via INTRA_ARTERIAL

## 2016-07-16 MED ORDER — TEMAZEPAM 15 MG PO CAPS: 15.0000 mg | ORAL_CAPSULE | Freq: Once | ORAL | Status: DC | PRN

## 2016-07-16 MED ORDER — MAGNESIUM SULFATE 50 % IJ SOLN
40.0000 meq | INTRAMUSCULAR | Status: DC
  Filled 2016-07-16: qty 10

## 2016-07-16 MED ORDER — TRANEXAMIC ACID (OHS) PUMP PRIME SOLUTION
2.0000 mg/kg | INTRAVENOUS | Status: DC
  Filled 2016-07-16: qty 1.91

## 2016-07-16 MED ORDER — LIDOCAINE HCL (PF) 1 % IJ SOLN
INTRAMUSCULAR | Status: DC | PRN
Start: 2016-07-16 — End: 2016-07-16
  Administered 2016-07-16: 2 mL

## 2016-07-16 MED ORDER — MIDAZOLAM HCL 2 MG/2ML IJ SOLN
INTRAMUSCULAR | Status: DC | PRN
Start: 2016-07-16 — End: 2016-07-16
  Administered 2016-07-16: 1 mg via INTRAVENOUS

## 2016-07-16 MED ORDER — TEMAZEPAM 15 MG PO CAPS
15.0000 mg | ORAL_CAPSULE | Freq: Once | ORAL | Status: AC | PRN
  Administered 2016-07-16: 15 mg via ORAL
  Filled 2016-07-16: qty 1

## 2016-07-16 MED ORDER — ETOMIDATE 2 MG/ML IV SOLN
INTRAVENOUS | Status: AC
  Filled 2016-07-16: qty 10

## 2016-07-16 MED ORDER — DEXMEDETOMIDINE HCL IN NACL 400 MCG/100ML IV SOLN
0.1000 ug/kg/h | INTRAVENOUS | Status: DC
  Filled 2016-07-16: qty 100

## 2016-07-16 MED ORDER — HYDRALAZINE HCL 20 MG/ML IJ SOLN
10.0000 mg | Freq: Once | INTRAMUSCULAR | Status: AC
  Administered 2016-07-16: 10 mg via INTRAVENOUS
  Filled 2016-07-16: qty 1

## 2016-07-16 MED ORDER — HEPARIN SODIUM (PORCINE) 1000 UNIT/ML IJ SOLN
INTRAMUSCULAR | Status: AC
  Filled 2016-07-16: qty 1

## 2016-07-16 MED ORDER — FENTANYL CITRATE (PF) 100 MCG/2ML IJ SOLN
INTRAMUSCULAR | Status: AC
  Filled 2016-07-16: qty 2

## 2016-07-16 MED ORDER — POTASSIUM CHLORIDE CRYS ER 20 MEQ PO TBCR
60.0000 meq | EXTENDED_RELEASE_TABLET | Freq: Once | ORAL | Status: AC
  Administered 2016-07-16: 60 meq via ORAL
  Filled 2016-07-16: qty 3

## 2016-07-16 MED ORDER — HEPARIN (PORCINE) IN NACL 2-0.9 UNIT/ML-% IJ SOLN
INTRAMUSCULAR | Status: AC
  Filled 2016-07-16: qty 1000

## 2016-07-16 MED ORDER — HEPARIN SODIUM (PORCINE) 1000 UNIT/ML IJ SOLN
INTRAMUSCULAR | Status: DC | PRN
  Administered 2016-07-16: 5000 [IU] via INTRAVENOUS

## 2016-07-16 MED ORDER — LIDOCAINE HCL (PF) 1 % IJ SOLN
INTRAMUSCULAR | Status: AC
  Filled 2016-07-16: qty 30

## 2016-07-16 MED ORDER — TRANEXAMIC ACID 1000 MG/10ML IV SOLN
1.5000 mg/kg/h | INTRAVENOUS | Status: DC
  Filled 2016-07-16: qty 25

## 2016-07-16 MED ORDER — CHLORHEXIDINE GLUCONATE 0.12 % MT SOLN
15.0000 mL | Freq: Once | OROMUCOSAL | Status: AC
  Administered 2016-07-17: 15 mL via OROMUCOSAL
  Filled 2016-07-16: qty 15

## 2016-07-16 SURGICAL SUPPLY — 10 items
CATH EXPO 5FR ANG PIGTAIL 145 (CATHETERS) ×2 IMPLANT
CATH OPTITORQUE TIG 4.0 5F (CATHETERS) ×2 IMPLANT
DEVICE RAD COMP TR BAND LRG (VASCULAR PRODUCTS) ×2 IMPLANT
GLIDESHEATH SLEND A-KIT 6F 22G (SHEATH) ×2 IMPLANT
GUIDEWIRE INQWIRE 1.5J.035X260 (WIRE) IMPLANT
INQWIRE 1.5J .035X260CM (WIRE) ×6
KIT HEART LEFT (KITS) ×3 IMPLANT
PACK CARDIAC CATHETERIZATION (CUSTOM PROCEDURE TRAY) ×3 IMPLANT
TRANSDUCER W/STOPCOCK (MISCELLANEOUS) ×3 IMPLANT
TUBING CIL FLEX 10 FLL-RA (TUBING) ×3 IMPLANT

## 2016-07-16 NOTE — Anesthesia Preprocedure Evaluation (Addendum)
Anesthesia Evaluation  Patient identified by MRN, date of birth, ID band Patient awake    Reviewed: Allergy & Precautions, NPO status , Patient's Chart, lab work & pertinent test results  History of Anesthesia Complications Negative for: history of anesthetic complications  Airway Mallampati: II  TM Distance: >3 FB Neck ROM: Full    Dental  (+) Teeth Intact, Dental Advisory Given   Pulmonary asthma , Current Smoker,    Pulmonary exam normal        Cardiovascular hypertension, + angina + CAD  negative cardio ROS Normal cardiovascular exam  Impressions:  - Normal LV systolic function; moderate LVH; grade 1 diastolic   dysfunction; trace TR.   Neuro/Psych negative neurological ROS  negative psych ROS   GI/Hepatic negative GI ROS, (+) Hepatitis -Hx of Hep C, treated with Harvoni   Endo/Other  negative endocrine ROS  Renal/GU negative Renal ROS     Musculoskeletal negative musculoskeletal ROS (+)   Abdominal   Peds  Hematology negative hematology ROS (+)   Anesthesia Other Findings Day of surgery medications reviewed with the patient.  Reproductive/Obstetrics                           Anesthesia Physical Anesthesia Plan  ASA: IV  Anesthesia Plan: General   Post-op Pain Management:    Induction: Intravenous  Airway Management Planned: Oral ETT  Additional Equipment: Arterial line, PA Cath, 3D TEE and Ultrasound Guidance Line Placement  Intra-op Plan:   Post-operative Plan: Post-operative intubation/ventilation  Informed Consent:   Dental advisory given  Plan Discussed with: Anesthesiologist, CRNA and Surgeon  Anesthesia Plan Comments:       Anesthesia Quick Evaluation

## 2016-07-16 NOTE — Significant Event (Signed)
Patient expressing not believing he needs surgery because of how urgent this has been. Patient believes he needs a second opinion. Surgeon has been in room to speak with patient since admitted to SICU, awaiting on spouse to get to talk with her. Told patient we can get another MD to see him, however, patient declines. Patient agrees to wait on spouse before making any further decision. Patient's spouse en route to hospital.    Alwyn PeaKSOR, Jones SkeneHYIU

## 2016-07-16 NOTE — Progress Notes (Signed)
  Echocardiogram 2D Echocardiogram has been performed.  Bleu Minerd L Androw 07/16/2016, 2:06 PM

## 2016-07-16 NOTE — Progress Notes (Signed)
Patient transported to cath lab

## 2016-07-16 NOTE — Progress Notes (Signed)
  Echocardiogram 2D Echocardiogram has been attempted. Pt not available,having PFT.  Marisue Humble 07/16/2016, 10:37 AM

## 2016-07-16 NOTE — Significant Event (Signed)
TR Band removed at 1130am. No bleeding during process of removing air. Spoke with Dr. Dorethea ClanHardin regarding heparin drip. Per MD, to restart heparin in 6 hours after TR band removed.   Alfred Eckley

## 2016-07-16 NOTE — Progress Notes (Signed)
PROGRESS NOTE    Edward Farmer  ZOX:[id number] DOB: 1960-09-13 DOA: 07/14/2016 PCP: Jacklynn Barnacle, NP   Brief Narrative:  56 year old male with past medical history of hypertension, smoking and asthma presented to the ED with complaints of chest pain. While patient was here he had typical chest pain and the EKG showed T-wave inversions. Given his risk factors he was taken for cardiac catheterization which showed severe ostial LAD disease with some distal left main disease. Was also noted ostial lesion may be somewhat ulcerated. He was evaluated by cardiothoracic surgery and was deemed a candidate for surgical intervention.   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 -Evaluated by cardiothoracic surgery and is deemed to be a candidate -Likely surgical intervention tomorrow.  Bradycardia -Hold off on any AV nodal blockers at this time  Essential hypertension -Continue current medication. Monitor blood pressure at this time. Hydrochlorothiazide lisinopril on hold due to acute kidney injury  Normocytic anemia -Iron studies have been ordered. Saturation is low at this time. I will order for iron supplements.  Hepatitis C -Was treated with harvoni in the past.  DVT prophylaxis: Lovenox Code Status: Full Family Communication:  No family at bedside  Disposition Plan: Likely CABG tomorrow. Cardiothoracic following.   Consultants:   Cardiology   Cardiothoracic   Procedures:   [facility] 3/13  Antimicrobials:   None   Subjective: No chest pain at this time. Tolerated LHC well this morning.   Objective: Vitals:   07/16/16 1200 07/16/16 1300 07/16/16  1400 07/16/16 1500  BP:  130/90 (!) 150/108 (!) 149/96  Pulse: 86 95 85 93  Resp: 12 11 (!) 9 20  Temp:    98.2 F (36.8 C)  TempSrc:    Oral  SpO2: 97% 96% 97% 96%  Weight:      Height:        Intake/Output Summary (Last 24 hours) at 07/16/16 1609 Last data filed at 07/16/16 1500  Gross per 24 hour  Intake           913.08 ml  Output             4800 ml  Net         -3886.92 ml   Filed Weights   07/14/16 1634 07/15/16 2322  Weight: 95.3 kg (210 lb) 95.3 kg (210 lb 1.6 oz)    Examination:  General exam: Appears calm and comfortable  Respiratory system: Clear to auscultation. Respiratory effort normal. Cardiovascular system: S1 & S2 heard, RRR. No JVD, murmurs, rubs, gallops or clicks. No pedal edema. Gastrointestinal system: Abdomen is nondistended, soft and nontender. No organomegaly or masses felt. Normal bowel sounds heard. Central nervous system: Alert and oriented. No focal neurological deficits. Extremities: Symmetric 5 x 5 power. Skin: No rashes, lesions or ulcers Psychiatry: Judgement and insight appear normal. Mood & affect appropriate.     Data Reviewed:   CBC:  Recent Labs Lab 07/14/16 1642 07/15/16 0225 07/16/16 0222  WBC 10.7* 10.6* 10.7*  HGB 12.4* 11.6* 12.5*  HCT 37.6* 36.0* 38.5*  MCV 87.2 87.0 86.5  PLT 206 195 206   Basic Metabolic Panel:  Recent Labs  Lab 07/14/16 1642 07/15/16 0225 07/16/16 0222  NA 141 138 139  K 3.7 3.2* 3.3*  CL 104 106 107  CO2 26 23 23   GLUCOSE 95 96 99  BUN 15 14 12   CREATININE 1.29* 1.11 1.04  CALCIUM 9.0 8.2* 8.6*   GFR: Estimated Creatinine Clearance: 90.3 mL/min (by C-G formula based on SCr of 1.04 mg/dL). Liver Function Tests:  Recent Labs Lab 07/15/16 0225  AST 24  ALT 22  ALKPHOS 62  BILITOT 0.7  PROT 6.5  ALBUMIN 3.2*   No results for input(s): LIPASE, AMYLASE in the last 168 hours. No results for input(s): AMMONIA in the last 168 hours. Coagulation Profile:  Recent Labs Lab  07/16/16 0222  INR 1.01   Cardiac Enzymes:  Recent Labs Lab 07/14/16 2045 07/14/16 2336 07/15/16 0225  TROPONINI <0.03 <0.03 0.03*   BNP (last 3 results) No results for input(s): PROBNP in the last 8760 hours. HbA1C:  Recent Labs  07/14/16 2045  HGBA1C 5.8*   CBG: No results for input(s): GLUCAP in the last 168 hours. Lipid Profile:  Recent Labs  07/14/16 2045  CHOL 174  HDL 34*  LDLCALC 94  TRIG 161*  CHOLHDL 5.1   Thyroid Function Tests: No results for input(s): TSH, T4TOTAL, FREET4, T3FREE, THYROIDAB in the last 72 hours. Anemia Panel:  Recent Labs  07/16/16 0222  VITAMINB12 253  FOLATE 11.0  FERRITIN 186  TIBC 351  IRON 46  RETICCTPCT 2.4   Sepsis Labs: No results for input(s): PROCALCITON, LATICACIDVEN in the last 168 hours.  Recent Results (from the past 240 hour(s))  MRSA PCR Screening     Status: None   Collection Time: 07/14/16  9:22 PM  Result Value Ref Range Status   MRSA by PCR NEGATIVE NEGATIVE Final    Comment:        The GeneXpert MRSA Assay (FDA approved for NASAL specimens only), is one component of a comprehensive MRSA colonization surveillance program. It is not intended to diagnose MRSA infection nor to guide or monitor treatment for MRSA infections.   Surgical PCR screen     Status: None   Collection Time: 07/16/16  9:59 AM  Result Value Ref Range Status   MRSA, PCR NEGATIVE NEGATIVE Final   Staphylococcus aureus NEGATIVE NEGATIVE Final    Comment:        The Xpert SA Assay (FDA approved for NASAL specimens in patients over 55 years of age), is one component of a comprehensive surveillance program.  Test performance has been validated by [facility] for patients greater than or equal to 60 year old. It is not intended to diagnose infection nor to guide or monitor treatment.          Radiology Studies: Dg Chest Portable 1 View  Result Date: 07/14/2016 CLINICAL DATA:  Left-sided chest pain with shortness  of breath EXAM: PORTABLE CHEST 1 VIEW COMPARISON:  07/22/2012 FINDINGS: 1735 hours. The cardio pericardial silhouette is enlarged. The lungs are clear wiithout focal pneumonia, edema, pneumothorax or pleural effusion. The visualized bony structures of the thorax are intact. Telemetry leads overlie the chest. IMPRESSION: Cardiomegaly without acute findings. Electronically Signed   By: Kennith Center M.D.   On: 07/14/2016 17:52        Scheduled Meds: . amLODipine  10 mg Oral Daily  . aspirin EC  81 mg Oral Daily  . [START ON 07/17/2016] cefUROXime (ZINACEF)  IV  1.5 g Intravenous To OR  . [START ON 07/17/2016] cefUROXime (  ZINACEF)  IV  750 mg Intravenous To OR  . [START ON 07/17/2016] chlorhexidine  15 mL Mouth/Throat Once  . Chlorhexidine Gluconate Cloth  6 each Topical Once   And  . Chlorhexidine Gluconate Cloth  6 each Topical Once  . [START ON 07/17/2016] dexmedetomidine  0.1-0.7 mcg/kg/hr Intravenous To OR  . [START ON 07/17/2016] DOPamine  0-10 mcg/kg/min Intravenous To OR  . [START ON 07/17/2016] epinephrine  0-10 mcg/min Intravenous To OR  . [START ON 07/17/2016] heparin-papaverine-plasmalyte irrigation   Irrigation To OR  . [START ON 07/17/2016] heparin 30,000 units/NS 1000 mL solution for CELLSAVER   Other To OR  . hydrALAZINE  10 mg Oral Q8H  . [START ON 07/17/2016] insulin (NOVOLIN-R) infusion   Intravenous To OR  . [START ON 07/17/2016] magnesium sulfate  40 mEq Other To OR  . [START ON 07/17/2016] metoprolol tartrate  12.5 mg Oral Once  . [START ON 07/17/2016] nitroGLYCERIN  2-200 mcg/min Intravenous To OR  . [START ON 07/17/2016] phenylephrine 20mg /22850mL NS (0.08mg /ml) infusion  30-200 mcg/min Intravenous To OR  . [START ON 07/17/2016] potassium chloride  80 mEq Other To OR  . [START ON 07/17/2016] tranexamic acid (CYKLOKAPRON) infusion ([facility])  1.5 mg/kg/hr Intravenous To OR  . [START ON 07/17/2016] tranexamic acid  15 mg/kg Intravenous To OR  . [START ON 07/17/2016] tranexamic acid  2 mg/kg  Intracatheter To OR  . [START ON 07/17/2016] vancomycin  1,500 mg Intravenous To OR   Continuous Infusions: . heparin 1,200 Units/hr (07/16/16 1529)  . nitroGLYCERIN 25 mcg/min (07/16/16 1420)     LOS: 1 day    Time spent: 35 mins     Casie Sturgeon Joline Maxcyhirag Refugio Vandevoorde, MD [facility] Pager [phone]   If 7PM-7AM, please contact night-coverage [email or web address] Password [email or web address] 07/16/2016, 4:09 PM

## 2016-07-16 NOTE — Progress Notes (Signed)
Pre-op Cardiac Surgery  Carotid Findings:   Findings are consistent with a 1-39 percent stenosis involving the right internal carotid artery and the left internal carotid artery. The vertebral arteries demonstrate antegrade flow.  Upper Extremity Right Left  Brachial Pressures 159  Triphasic 151  Triphasic  Radial Waveforms Triphasic Triphasic  Ulnar Waveforms Triphasic Triphasic  Palmar Arch (Allen's Test) Palmar waveforms decrease greater than fifty percent with radial and ulnar compression. Palmar waveforms decrease greater than fifty percent with radial and ulnar compression.    Lower  Extremity Right Left  Dorsalis Pedis 157 1.07  Posterior Tibial 132 Inconsistent waveform  Ankle/Brachial Indices 0.99 1.07   Findings:   Right ABI of 0.99 and left ABI of 1.07 are suggestive of arterial flow within normal limits at rest. Unable to obtain left posterior tibial pressure due to inconsistent waveform.

## 2016-07-16 NOTE — H&P (View-Only) (Signed)
CARDIOLOGY CONSULT NOTE   Patient ID: Edward Farmer MRN: [id number] DOB/AGE: October 24, 1960 56 y.o.  Admit date: 07/14/2016  Primary Physician   Jacklynn Barnacle, NP Primary Cardiologist   New to Dr. Allyson Sabal Reason for Consultation   Chest pain and abnormal EKG Requesting Physician  Dr. Sharon Seller  HPI: Edward Farmer is a 56 y.o. male with a history of uncontrolled hypertension, ongoing tobacco abuse, chronic hepatitis C and asthma presented to [facility] 07/14/16 for evaluation of chest pain.  History of tobacco smoking for at least 10 year. He used to smoke 2 packs a day. Trying to quit. Currently smoking 2-5 cigarettes a day. Uncontrolled blood pressure for at least 5 years. Father had a MI at age 61 and died of this. He works for a [employer] and has not had substernal chest pressure with shortness of breath with pulse mover. Symptoms resolved with rest. Admits orthopnea and PND. He is scheduled for sleep study as an outpatient. Patient denies palpitation, dizziness, syncope, lower extremity edema, melena or blood in his stool or urine.  Yesterday patient had a left-sided sharp chest pain while cooking meal. Associated with severe shortness of breath with left from numbness and diaphoresis. His symptoms recently worsen over the past couple of weeks. Due to ongoing symptoms and worsening episode last evening he came to ER for further evaluation. Symptoms did not improve with sublingual nitroglycerin. Upon presentation blood pressure was 182/119. Started on metoprolol however developed bradycardia to high 30s. Now discontinued with improvement of rate. Home is HCTZ/lisinopril held due to AKI.   Troponin less than 0.03 x 2 --> 0.03. EKG upon presentation shows sinus rhythm at rate of 82 bpm, Q waves in anterior lead, T-wave inversion in lead V6. Repeat EKG this morning shows new T-wave inversion in inferior lateral lead. This appears similar to EKG of 07/2012.  He is chest pain-free  since admission. However complains of intermittent dyspnea. Chest x-ray shows cardiomegaly without acute finding.  07/14/2016: Cholesterol 174; HDL 34; LDL Cholesterol 94; Triglycerides 231; VLDL 46  K of 3.2  Past Medical History:  Diagnosis Date  . Asthma   . Hypertension      History reviewed. No pertinent surgical history.  No Known Allergies  I have reviewed the patient's current medications . amLODipine  10 mg Oral Daily  . [START ON 07/16/2016] aspirin EC  81 mg Oral Daily  . enoxaparin (LOVENOX) injection  40 mg Subcutaneous Q24H  . hydrALAZINE  10 mg Oral Q8H  . isosorbide dinitrate  5 mg Oral TID    acetaminophen, gi cocktail, morphine injection, ondansetron (ZOFRAN) IV  Prior to Admission medications   Medication Sig Start Date End Date Taking? Authorizing Provider  hydrochlorothiazide (HYDRODIURIL) 25 MG tablet Take 25 mg by mouth daily.   Yes Historical Provider, MD  ibuprofen (ADVIL,MOTRIN) 800 MG tablet Take 800 mg by mouth 3 (three) times daily as needed for headache (pain).   Yes Historical Provider, MD  lisinopril-hydrochlorothiazide (PRINZIDE,ZESTORETIC) 20-25 MG tablet Take 1 tablet by mouth daily.   Yes Historical Provider, MD     Social History   Social History  . Marital status: Legally Separated    Spouse name: N/A  . Number of children: N/A  . Years of education: N/A   Occupational History  . Not on file.   Social History Main Topics  . Smoking status: Current Every Day Smoker    Packs/day: 0.25    Types: Cigarettes  .  Smokeless tobacco: Never Used     Comment: cutting back  . Alcohol use 0.0 oz/week     Comment: occ  . Drug use: No  . Sexual activity: Not on file   Other Topics Concern  . Not on file   Social History Narrative  . No narrative on file    Family Status  Relation Status  . Mother   . Father    Family History  Problem Relation Age of Onset  . Migraines Mother   . Heart attack Father     ROS:  Full 14 point  review of systems complete and found to be negative unless listed above.  Physical Exam: Blood pressure (!) 170/124, pulse 86, temperature 97.5 F (36.4 C), temperature source Oral, resp. rate (!) 30, height 5\' 9"  (1.753 m), weight 210 lb (95.3 kg), SpO2 92 %.  General: Well developed, well nourished, male in no acute distress Head: Eyes PERRLA, No xanthomas. Normocephalic and atraumatic, oropharynx without edema or exudate.  Lungs: Resp regular and unlabored. Course breath sound with scattered wheezing at base.  Heart: RRR no s3, s4, or murmurs..   Neck: No carotid bruits. No lymphadenopathy.  No JVD. Abdomen: Bowel sounds present, abdomen soft and non-tender without masses or hernias noted. Msk:  No spine or cva tenderness. No weakness, no joint deformities or effusions. Extremities: No clubbing, cyanosis or edema. DP/PT/Radials 2+ and equal bilaterally. Neuro: Alert and oriented X 3. No focal deficits noted. Psych:  Good affect, responds appropriately Skin: No rashes or lesions noted.  Labs:   Lab Results  Component Value Date   WBC 10.6 (H) 07/15/2016   HGB 11.6 (L) 07/15/2016   HCT 36.0 (L) 07/15/2016   MCV 87.0 07/15/2016   PLT 195 07/15/2016   No results for input(s): INR in the last 72 hours.  Recent Labs Lab 07/15/16 0225  NA 138  K 3.2*  CL 106  CO2 23  BUN 14  CREATININE 1.11  CALCIUM 8.2*  PROT 6.5  BILITOT 0.7  ALKPHOS 62  ALT 22  AST 24  GLUCOSE 96  ALBUMIN 3.2*   No results found for: MG  Recent Labs  07/14/16 2045 07/14/16 2336 07/15/16 0225  TROPONINI <0.03 <0.03 0.03*    Recent Labs  07/14/16 1646  TROPIPOC 0.01   No results found for: PROBNP Lab Results  Component Value Date   CHOL 174 07/14/2016   HDL 34 (L) 07/14/2016   LDLCALC 94 07/14/2016   TRIG 231 (H) 07/14/2016   No results found for: DDIMER No results found for: LIPASE, AMYLASE TSH  Date/Time Value Ref Range Status  07/29/2007 08:51 PM 0.856 0.350 - 5.50 microintl  units/mL Final    Comment:    See lab report for associated comment(s)   Iron  Date/Time Value Ref Range Status  12/28/2013 11:26 AM 67 42 - 165 ug/dL Final    Radiology:  Dg Chest Portable 1 View  Result Date: 07/14/2016 CLINICAL DATA:  Left-sided chest pain with shortness of breath EXAM: PORTABLE CHEST 1 VIEW COMPARISON:  07/22/2012 FINDINGS: 1735 hours. The cardio pericardial silhouette is enlarged. The lungs are clear wiithout focal pneumonia, edema, pneumothorax or pleural effusion. The visualized bony structures of the thorax are intact. Telemetry leads overlie the chest. IMPRESSION: Cardiomegaly without acute findings. Electronically Signed   By: Kennith CenterEric  Mansell M.D.   On: 07/14/2016 17:52    ASSESSMENT AND PLAN:     1. Chest pain - Has both typical and atypical  features. Patient has a long-standing history of chest pain in setting of elevated blood pressure. EKG shows new T-wave inversion in inferior lateral leads this morning. This was present in EKG of 2014. ? LVH. His cardiac risk factors includes family history of CAD, uncontrolled blood pressure, ongoing tobacco smoking and possible sleep apnea. - Continue aspirin. Will plan for cath tomorrow.   2. Uncontrolled hypertension - Long-standing history. Continue hydralazine, isosorbide and Norvasc. Up titrate as needed. AKI has been resolved. May consider resuming home with medicine post cath.   3. Dyspnea - This been intermittent. He is not volume overloaded on exam. However distended abdomen. CXR without acute findings. CXR without acute findings. Seems chronic diastolic dysfunction given uncontrolled blood pressure. Had has orthopnea and PND. Likely due to possible sleep apnea. He schedule for sleep study as outpatient.   4. Ongoing tobacco abuse - He is trying to quit. Encourage complete cessation. Education given.  5. Hypokalemia - supplement given  6. Bradycardia - occurred on BB. Rate improved with discontinuation.    7. AKI - As above   Signed: Bhagat,Bhavinkumar, PA 07/15/2016, 12:48 PM Pager [phone]  Co-Sign MD   Agree with note by Chelsea Aus PA-C  56 year old mildly overweight African-American male admitted with unstable angina. He has positive cardiac risk factors including uncontrolled hypertension, tobacco abuse and family history. He's had effort angina for several years and episode of chest pain that was unprovoked and spontaneous. His EKG shows LVH with repolarization changes versus lateral T-wave inversion. His enzymes are negative. Exam is benign. I believe he would benefit from left heart cath to define his anatomy.The patient understands that risks included but are not limited to stroke (1 in 1000), death (1 in 1000), kidney failure [usually temporary] (1 in 500), bleeding (1 in 200), allergic reaction [possibly serious] (1 in 200). The patient understands and agrees to proceed   Runell Gess, M.D., FACP, [facility], Kathryne Eriksson [facility] [address] [place], [place]  [address]  [phone] 07/15/2016 1:31 PM

## 2016-07-16 NOTE — Consult Note (Signed)
[address]       [place] [address]             [phone]        GILL DELROSSI [facility] Medical Record #[id number] Date of Birth: Aug 15, 1960  Referring: Dr Allyson Sabal and Herbie [place] Primary Care: Jacklynn Barnacle, NP  Chief Complaint:    Chief Complaint  Patient presents with  . Chest Pain  . Dizziness    History of Present Illness:     Patient  is a 56 y.o. male with a history of poorly  hypertension, ongoing tobacco abuse down to 2-3 cig a day. Two mornings ago while fixing breakfast he not feeling bad and having chest pain, he was going to drive himself to ER but fetlt bad enough he called EMS and was admitted two days ago.  He has been having exertional dyspnea/angina with exertion. He works in  [employer] loss [employer]er.    He has significant family h/o premature CAD , Father had a MI at age 54 and died of this, chronic hepatitis C and asthma presented to [facility] ER 07/14/16 for evaluation of chest pain / unstable angina.  Patient seen by Dr Laneta Simmers as emergency consult in  cath lab this am, but patient wanted to discuss with his family proceeding.   Current Activity/ Functional Status: Patient is independent with mobility/ambulation, transfers, ADL's, IADL's.   Zubrod Score: At the time of surgery this patient's most appropriate activity status/level should be described as: []     0    Normal activity, no symptoms [x]     1    Restricted in physical strenuous activity but ambulatory, able to do out light work []     2    Ambulatory and capable of self care, unable to do work activities, up and about                 more than 50%  Of the time                            []     3    Only limited self care, in bed greater than 50% of waking hours []     4    Completely disabled, no self care, confined to bed or chair []     5    Moribund  Past Medical History:  Diagnosis Date  . Asthma   . Bradycardia 07/14/2016   on BB. Rate improved with  discontinuation.   . Hypertension   . Tobacco abuse     Past Surgical History:  Procedure Laterality Date  . LEFT HEART CATH AND CORONARY ANGIOGRAPHY N/A 07/16/2016   Procedure: Left Heart Cath and Coronary Angiography;  Surgeon: Marykay Lex, MD;  Location: [facility] INVASIVE CV LAB;  Service: Cardiovascular;  Laterality: N/A;    History  Smoking Status  . Current Every Day Smoker  . Packs/day: 0.25  . Types: Cigarettes  Smokeless Tobacco  . Never Used    Comment: cutting back    History  Alcohol Use  . 0.0 oz/week    Comment: occ    Social History   Social History  . Marital status: Legally Separated    Spouse name: N/A  . Number of children: N/A  . Years of education: N/A   Occupational History  . Not on file.   Social History Main Topics  . Smoking status: Current Every  Day Smoker    Packs/day: 0.25    Types: Cigarettes  . Smokeless tobacco: Never Used     Comment: cutting back  . Alcohol use 0.0 oz/week     Comment: occ  . Drug use: No  . Sexual activity: Not on file   Other Topics Concern  . Not on file   Social History Narrative  . No narrative on file    Allergies  Allergen Reactions  . No Known Allergies     Current Facility-Administered Medications  Medication Dose Route Frequency Provider Last Rate Last Dose  . acetaminophen (TYLENOL) tablet 650 mg  650 mg Oral Q4H PRN Inez Catalina, MD   650 mg at 07/16/16 1441  . amLODipine (NORVASC) tablet 10 mg  10 mg Oral Daily Lonia Blood, MD   10 mg at 07/16/16 1104  . aspirin EC tablet 81 mg  81 mg Oral Daily Lonia Blood, MD   81 mg at 07/16/16 1104  . [START ON 07/17/2016] cefUROXime (ZINACEF) 1.5 g in dextrose 5 % 50 mL IVPB  1.5 g Intravenous To OR Delight Ovens, MD      . Melene Muller ON 07/17/2016] cefUROXime (ZINACEF) 750 mg in dextrose 5 % 50 mL IVPB  750 mg Intravenous To OR Delight Ovens, MD      . Melene Muller ON 07/17/2016] chlorhexidine (PERIDEX) 0.12 % solution 15 mL  15 mL  Mouth/Throat Once Delight Ovens, MD      . Chlorhexidine Gluconate Cloth 2 % PADS 6 each  6 each Topical Once Delight Ovens, MD      . Melene Muller ON 07/17/2016] dexmedetomidine (PRECEDEX) 400 MCG/100ML (4 mcg/mL) infusion  0.1-0.7 mcg/kg/hr Intravenous To OR Delight Ovens, MD      . Melene Muller ON 07/17/2016] DOPamine (INTROPIN) 800 mg in dextrose 5 % 250 mL (3.2 mg/mL) infusion  0-10 mcg/kg/min Intravenous To OR Delight Ovens, MD      . Melene Muller ON 07/17/2016] EPINEPHrine (ADRENALIN) 4 mg in dextrose 5 % 250 mL (0.016 mg/mL) infusion  0-10 mcg/min Intravenous To OR Delight Ovens, MD      . gi cocktail (Maalox,Lidocaine,Donnatal)  30 mL Oral QID PRN Inez Catalina, MD      . Melene Muller ON 07/17/2016] heparin 2,500 Units, papaverine 30 mg in electrolyte-148 (PLASMALYTE-148) 500 mL irrigation   Irrigation To OR Delight Ovens, MD      . Melene Muller ON 07/17/2016] heparin 30,000 units/NS 1000 mL solution for CELLSAVER   Other To OR Delight Ovens, MD      . heparin ADULT infusion 100 units/mL (25000 units/282mL sodium chloride 0.45%)  1,200 Units/hr Intravenous Continuous Almon Hercules, RPH 12 mL/hr at 07/16/16 2000 1,200 Units/hr at 07/16/16 2000  . hydrALAZINE (APRESOLINE) tablet 10 mg  10 mg Oral Q8H Lonia Blood, MD   10 mg at 07/16/16 1441  . [START ON 07/17/2016] insulin regular (NOVOLIN R,HUMULIN R) 250 Units in sodium chloride 0.9 % 250 mL (1 Units/mL) infusion   Intravenous To OR Delight Ovens, MD      . Melene Muller ON 07/17/2016] magnesium sulfate (IV Push/IM) injection 40 mEq  40 mEq Other To OR Delight Ovens, MD      . Melene Muller ON 07/17/2016] metoprolol tartrate (LOPRESSOR) tablet 12.5 mg  12.5 mg Oral Once Delight Ovens, MD      . morphine 2 MG/ML injection 2 mg  2 mg Intravenous Q2H PRN Inez Catalina, MD      . [  START ON 07/17/2016] nitroGLYCERIN 50 mg in dextrose 5 % 250 mL (0.2 mg/mL) infusion  2-200 mcg/min Intravenous To OR Delight Ovens, MD      . nitroGLYCERIN 50 mg in  dextrose 5 % 250 mL (0.2 mg/mL) infusion  0-200 mcg/min Intravenous Titrated Bhavinkumar Bhagat, PA 9 mL/hr at 07/16/16 2000 30 mcg/min at 07/16/16 2000  . ondansetron (ZOFRAN) injection 4 mg  4 mg Intravenous Q6H PRN Inez Catalina, MD      . Melene Muller ON 07/17/2016] phenylephrine (NEO-SYNEPHRINE) 20 mg in sodium chloride 0.9 % 250 mL (0.08 mg/mL) infusion  30-200 mcg/min Intravenous To OR Delight Ovens, MD      . Melene Muller ON 07/17/2016] potassium chloride injection 80 mEq  80 mEq Other To OR Delight Ovens, MD      . temazepam (RESTORIL) capsule 15 mg  15 mg Oral Once PRN Ankit Joline Maxcy, MD      . Melene Muller ON 07/17/2016] tranexamic acid (CYKLOKAPRON) 2,500 mg in sodium chloride 0.9 % 250 mL (10 mg/mL) infusion  1.5 mg/kg/hr Intravenous To OR Delight Ovens, MD      . Melene Muller ON 07/17/2016] tranexamic acid (CYKLOKAPRON) bolus via infusion - over 30 minutes 1,429.5 mg  15 mg/kg Intravenous To OR Delight Ovens, MD      . Melene Muller ON 07/17/2016] tranexamic acid (CYKLOKAPRON) pump prime solution 191 mg  2 mg/kg Intracatheter To OR Delight Ovens, MD      . Melene Muller ON 07/17/2016] vancomycin (VANCOCIN) 1,500 mg in sodium chloride 0.9 % 250 mL IVPB  1,500 mg Intravenous To OR Delight Ovens, MD        Prescriptions Prior to Admission  Medication Sig Dispense Refill Last Dose  . hydrochlorothiazide (HYDRODIURIL) 25 MG tablet Take 25 mg by mouth daily.   couple days ago  . ibuprofen (ADVIL,MOTRIN) 800 MG tablet Take 800 mg by mouth 3 (three) times daily as needed for headache (pain).   07/14/2016 at am  . lisinopril-hydrochlorothiazide (PRINZIDE,ZESTORETIC) 20-25 MG tablet Take 1 tablet by mouth daily.   07/14/2016 at Unknown time    Family History  Problem Relation Age of Onset  . Migraines Mother   . Heart attack Father      Review of Systems:       Cardiac Review of Systems: Y or N  Chest Pain [   y ]  Resting SOB [ n  ] Exertional SOB  [ y ]  Orthopnea [ y ]   Pedal Edema [ n  ]      Palpitations [ n ] Syncope  [n ]   Presyncope [  n ]  General Review of Systems: [Y] = yes [  ]=no Constitional: recent weight change [ n ]; anorexia [  ]; fatigue [ y ]; nausea [  ]; night sweats [  ]; fever [  ]; or chills [  ]                                                               Dental: poor dentition[  ]; Last Dentist visit:   Eye : blurred vision [  ]; diplopia [   ]; vision changes [  ];  Amaurosis fugax[  ]; Resp: cough [  ];  wheezing[  ];  hemoptysis[  ]; shortness of breath[  ]; paroxysmal nocturnal dyspnea[  ]; dyspnea on exertion[ y ]; or orthopnea[  ];  GI:  gallstones[  ], vomiting[  ];  dysphagia[  ]; melena[  ];  hematochezia [  ]; heartburn[  ];   Hx of  Colonoscopy[  ]; GU: kidney stones [  ]; hematuria[  ];   dysuria [  ];  nocturia[  ];  history of     obstruction [  ]; urinary frequency [  ]             Skin: rash, swelling[  ];, hair loss[  ];  peripheral edema[  ];  or itching[  ]; Musculosketetal: myalgias[  ];  joint swelling[  ];  joint erythema[  ];  joint pain[  ];  back pain[  ];  Heme/Lymph: bruising[  ];  bleeding[  ];  anemia[  ];  Neuro: TIA[  n];  headaches[  ];  stroke[n  ];  vertigo[  ];  seizures[  ];   paresthesias[  ];  difficulty walking[ n ];  Psych:depression[  ]; anxiety[  ];  Endocrine: diabetes[n  ];  thyroid dysfunction[n  ];  Immunizations: Flu Milo.Brash  ]; Pneumococcal[ n ];  Other:  Physical Exam: BP (!) 125/91 (BP Location: Right Arm)   Pulse 93   Temp 97.8 F (36.6 C) (Oral)   Resp 18   Ht 5\' 9"  (1.753 m)   Wt 210 lb 1.6 oz (95.3 kg)   SpO2 95%   BMI 31.03 kg/m    General appearance: alert, cooperative, appears older than stated age and no distress Head: Normocephalic, without obvious abnormality, atraumatic Neck: no adenopathy, no carotid bruit, no JVD, supple, symmetrical, trachea midline and thyroid not enlarged, symmetric, no tenderness/mass/nodules Lymph nodes: Cervical, supraclavicular, and axillary nodes normal. Resp:  clear to auscultation bilaterally Back: symmetric, no curvature. ROM normal. No CVA tenderness. Cardio: regular rate and rhythm, S1, S2 normal, no murmur, click, rub or gallop GI: soft, non-tender; bowel sounds normal; no masses,  no organomegaly Extremities: extremities normal, atraumatic, no cyanosis or edema and Homans sign is negative, no sign of DVT Neurologic: Grossly normal Palpable dp and pt pulses, right radial cath site   Diagnostic Studies & Laboratory data:     Recent Radiology Findings:   No results found.   I have independently reviewed the above radiologic studies.  Recent Lab Findings: Lab Results  Component Value Date   WBC 10.7 (H) 07/16/2016   HGB 12.5 (L) 07/16/2016   HCT 38.5 (L) 07/16/2016   PLT 206 07/16/2016   GLUCOSE 99 07/16/2016   CHOL 174 07/14/2016   TRIG 231 (H) 07/14/2016   HDL 34 (L) 07/14/2016   LDLCALC 94 07/14/2016   ALT 22 07/15/2016   AST 24 07/15/2016   NA 139 07/16/2016   K 3.3 (L) 07/16/2016   CL 107 07/16/2016   CREATININE 1.04 07/16/2016   BUN 12 07/16/2016   CO2 23 07/16/2016   TSH 0.856 07/29/2007   INR 1.01 07/16/2016   HGBA1C 5.8 (H) 07/14/2016   CATH: Conclusion     LM lesion, 45 %stenosed. Does not compromise flow to the circumflex or ramus  Ost LAD lesion, 85 %stenosed.  The left ventricular systolic function is normal. The left ventricular ejection fraction is greater than 65% by visual estimate.  LV end diastolic pressure is severely elevated.  There is no aortic valve stenosis.   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 After reviewing the images with Dr. Allyson Sabal and Dr. Laneta Simmers, we all feel that the left main does not compromise flow to the other branches of the left dominant system. The itself has an ostial lesion that is somewhat ulcerated.   Dr. Laneta Simmers was called and initially had scheduled the patient for urgent CABG today. Unfortunately the patient was not ready to decide to go to  CABG today without discussing with his family members. We will therefore transfer him to the  Surgical ICU with plans for potential CABG as early as tomorrow for later today.  He was given a total of 20 mg IV hydralazine Cath Lab for blood pressure control along with 40 mg of Lasix for significantly elevated LVEDP  He will be started on IV NTG. Add ASA & restart IV Heparin   Bryan Lemma, M.D., M.S. Interventional Cardiologist     I have independently reviewed the above  cath films and reviewed the findings with the  patient  And Dr Laneta Simmers  Who  originally  saw patient in cath lab. Left main does not look significant, high grade proximal lad.  ------------------------------------------------------------------- Transthoracic Echocardiography  Patient:    Edward Farmer, Edward Farmer MR #:       [id number] Study Date: 07/16/2016 Gender:     M Age:        55 Height:     175.3 cm Weight:     95.3 kg BSA:        2.18 m^2 Pt. 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  ------------------------------------------------------------------- History:   Risk factors:  Acute Kidney injury. Hypertension. Obese.   ------------------------------------------------------------------- Study Conclusions  - Left ventricle: The cavity size was normal. Wall thickness was   increased in a pattern of moderate LVH. Systolic function was   normal. The estimated ejection fraction was in the range of 55%   to 60%. Wall motion was normal; there were no regional wall   motion abnormalities. Doppler parameters are  consistent with   abnormal left ventricular relaxation (grade 1 diastolic   dysfunction). - Mitral valve: Valve area by pressure half-time: 1.96 cm^2.  Impressions:  - Normal LV systolic function; moderate LVH; grade 1 diastolic   dysfunction; trace TR.  ------------------------------------------------------------------- Study data:  No prior study was available for comparison.  Study status:  Routine.  Procedure:  The patient reported no pain pre or post test. Transthoracic echocardiography. Image quality was adequate.  Study completion:  There were no complications. Transthoracic echocardiography.  M-mode, complete 2D, spectral Doppler, and color Doppler.  Birthdate:  Patient birthdate: [birth year].  Age:  Patient is 56 yr old.  Sex:  Gender: male. BMI: 31 kg/m^2.  Blood pressure:     158/94  Patient status: Inpatient.  Study date:  Study date: 07/16/2016. Study time: 01:27 PM.  Location:  ICU/CCU  -------------------------------------------------------------------  ------------------------------------------------------------------- Left ventricle:  The cavity size was normal. Wall thickness was increased in a pattern of moderate LVH. Systolic function was normal. The estimated ejection fraction was in the range of 55% to  60%. Wall motion was normal; there were no regional wall motion abnormalities. Doppler parameters are consistent with abnormal left ventricular relaxation (grade 1 diastolic dysfunction).  ------------------------------------------------------------------- Aortic valve:   Trileaflet; mildly thickened leaflets. Mobility was not restricted.  Doppler:  Transvalvular velocity was within the normal range. There was no stenosis. There was no regurgitation.   ------------------------------------------------------------------- Aorta:  Aortic root: The aortic root was normal in size.  ------------------------------------------------------------------- Mitral  valve:   Structurally normal valve.   Mobility was not restricted.  Doppler:  Transvalvular velocity was within the normal range. There was no evidence for stenosis. There was no regurgitation.    Valve area by pressure half-time: 1.96 cm^2. Indexed valve area by pressure half-time: 0.9 cm^2/m^2.  ------------------------------------------------------------------- Left atrium:  The atrium was normal in size.  ------------------------------------------------------------------- Right ventricle:  The cavity size was normal. Systolic function was normal.  ------------------------------------------------------------------- Pulmonic valve:    Doppler:  Transvalvular velocity was within the normal range. There was no evidence for stenosis.  ------------------------------------------------------------------- Tricuspid valve:   Structurally normal valve.    Doppler: Transvalvular velocity was within the normal range. There was trivial regurgitation.  ------------------------------------------------------------------- Right atrium:  The atrium was normal in size.  ------------------------------------------------------------------- Pericardium:  There was no pericardial effusion.  ------------------------------------------------------------------- Systemic veins: Inferior vena cava: The vessel was normal in size.  ------------------------------------------------------------------- Measurements   Left ventricle                         Value          Reference  LV ID, ED, PLAX chordal        (L)     39    mm       43 - 52  LV ID, ES, PLAX chordal                30    mm       23 - 38  LV fx shortening, PLAX chordal (L)     23    %        >=29  LV PW thickness, ED                    13    mm       ----------  IVS/LV PW ratio, ED                    1.06           <=1.3  Stroke volume, 2D                      78    ml       ----------  Stroke volume/bsa, 2D                  36     ml/m^2   ----------  LV ejection fraction, 1-p A4C          46    %        ----------  LV e&', lateral                         5.77  cm/s     ----------  LV E/e&', lateral                       10.33          ----------  LV e&', medial                          3.81  cm/s     ----------  LV E/e&', medial                        15.64          ----------  LV e&', average                         4.79  cm/s     ----------  LV E/e&', average                       12.44          ----------    Ventricular septum                     Value          Reference  IVS thickness, ED                      13.76 mm       ----------    LVOT                                   Value          Reference  LVOT ID, S                             20    mm       ----------  LVOT area                              3.14  cm^2     ----------  LVOT peak velocity, S                  119   cm/s     ----------  LVOT mean velocity, S                  79    cm/s     ----------  LVOT VTI, S                            24.7  cm       ----------  LVOT peak gradient, S                  6     mm Hg    ----------    Aorta                                  Value          Reference  Aortic root ID, ED                     37    mm       ----------    Left atrium  Value          Reference  LA ID, A-P, ES                         35    mm       ----------  LA ID/bsa, A-P                         1.6   cm/m^2   <=2.2  LA volume, S                           33.5  ml       ----------  LA volume/bsa, S                       15.4  ml/m^2   ----------  LA volume, ES, 1-p A4C                 29.4  ml       ----------  LA volume/bsa, ES, 1-p A4C             13.5  ml/m^2   ----------  LA volume, ES, 1-p A2C                 32.4  ml       ----------  LA volume/bsa, ES, 1-p A2C             14.9  ml/m^2   ----------    Mitral valve                           Value          Reference  Mitral E-wave peak velocity             59.6  cm/s     ----------  Mitral A-wave peak velocity            68.9  cm/s     ----------  Mitral deceleration time       (H)     384   ms       150 - 230  Mitral pressure half-time              112   ms       ----------  Mitral E/A ratio, peak                 0.9            ----------  Mitral valve area, PHT, DP             1.96  cm^2     ----------  Mitral valve area/bsa, PHT, DP         0.9   cm^2/m^2 ----------    Right atrium                           Value          Reference  RA ID, S-I, ES, A4C                    48.1  mm       34 - 49  RA area, ES, A4C  16.1  cm^2     8.3 - 19.5  RA volume, ES, A/L                     43.1  ml       ----------  RA volume/bsa, ES, A/L                 19.8  ml/m^2   ----------    Systemic veins                         Value          Reference  Estimated CVP                          3     mm Hg    ----------    Right ventricle                        Value          Reference  TAPSE                                  13.8  mm       ----------  RV s&', lateral, S                      18.6  cm/s     ----------  Legend: (L)  and  (H)  mark values outside specified reference range.  ------------------------------------------------------------------- Prepared and Electronically Authenticated by  Olga Millers 2018-03-13T14:44:37  Assessment / Plan:     CAD- I have recommended to patientto proceed withCABG- Poorly controlled hypertension  The goals risks and alternatives of the planned surgical procedure CABG  have been discussed with the patient in detail. The risks of the procedure including death, infection, stroke, myocardial infarction, bleeding, blood transfusion have all been discussed specifically.  I have quoted [name] a 2 % of perioperative mortality and a complication rate as high as 40 %. The patient's questions have been answered.CODI KERTZ is willing  to proceed with the planned procedure. I  spent 40  minutes counseling the patient face to face and 50% or more the  time was spent in counseling and coordination of care. The total time spent in the appointment was 60 minutes.    Delight Ovens MD      [address] [place] [place] [address] Office [phone]   Beeper [phone]  07/16/2016 8:31 PM

## 2016-07-16 NOTE — Progress Notes (Signed)
Cath showed severe ostial LAD disease with some distal left main disease. After reviewing the images with Dr. Allyson SabalBerry and Dr. Laneta SimmersBartle, we all feel that the left main does not compromise flow to the other branches of the left dominant system. The itself has an ostial lesion that is somewhat ulcerated.   He is going to discuss with family regarding CABG. Likely tomorrow.

## 2016-07-16 NOTE — Care Management Note (Addendum)
Case Management Note  Patient Details  Name: Edward Farmer E Broome MRN: [id number] Date of Birth: 10-16-1960  Subjective/Objective:  S/p cath , has severe LAD disease, patient lives alone in an apartment, he states he has a girlfriend, [name] [phone], but they are no longer together,so he is not sure how much asstance he could get from her.  He states he has 3 children but they do not live here in [place], Kathreen CosierCherie Teehan is his daughter ,she lives in [place].  He also [facility] and he sees [name] PA there. He states he may need assistance with transportation and will need assistance with his medications at dc.  Depending on what the meds are  And when he is discharged he [facility].    NCM will cont to follow for dc needs.     3/14 1252 Letha Capeeborah Saragrace Selke RN, BSN - NCM called to leave message for [facility].  Awaiting call back. NCM received call back from [name] [facility] she states she will help [name] with what ever he needs. Her cell is [phone] and her email is [email or web address].  She states to give patient the paper scripts at dc and he will get them to her and she will get his meds for him thru their program, even if he is dc over the weekend.                  Action/Plan:   Expected Discharge Date:                  Expected Discharge Plan:  Home/Self Care  In-House Referral:     Discharge planning Services  CM Consult  Post Acute Care Choice:    Choice offered to:     DME Arranged:    DME Agency:     HH Arranged:    HH Agency:     Status of Service:  In process, will continue to follow  If discussed at Long Length of Stay Meetings, dates discussed:    Additional Comments:  Leone Havenaylor, Ryliegh Mcduffey Clinton, RN 07/16/2016, 3:10 PM

## 2016-07-16 NOTE — Progress Notes (Signed)
ANTICOAGULATION CONSULT NOTE  Pharmacy Consult for heparin Indication: chest pain/ACS  Heparin Dosing Weight: 90.5 kg   Assessment: 55 yom s/p cath 3/13 showing severe ostial LAD disease with some distal left main disease and CABG scheduled for 3/14. Pharmacy consulted to start heparin 4 hours post-TR band removal. Not on anticoagulation PTA but currently on Lovenox prophylaxis inpatient - last given 3/12 PM. CBC stable.  TR band removed at 1130 this AM. No bleed issues noted.  Goal of Therapy:  Heparin level 0.3-0.7 units/ml Monitor platelets by anticoagulation protocol: Yes   Plan:  D/c Lovenox Start heparin at 1200 units/h - begin 4 hours post-TR band removal at 1530 (no bolus) 6h heparin level Daily heparin level/CBC Monitor s/sx bleeding CABG scheduled for 3/14   Babs BertinHaley Hannibal Skalla, PharmD, BCPS Clinical Pharmacist 07/16/2016 1:23 PM

## 2016-07-16 NOTE — Significant Event (Signed)
RN spoke with patient's spouse [name], who stated she is coming, will be here in 20 minutes. This is the second time that this RN spoke with her. Our charge RN Joni Reiningicole has spoken with her as well and she told Joni Reiningicole that she was coming.  Patient's just told staff that Aram BeechamCynthia is actually his girlfriend, of 15 years, that they have been engaged multiple times. Patient told RN that he has three grown children, closest one is in [place]. Patient does not know numbers for staff to call them. Patient stated not to call his children but that Aram BeechamCynthia will let them know. Nonetheless, patient is waiting on Aram BeechamCynthia to make the decisions to proceed with surgery.     Edward Farmer

## 2016-07-16 NOTE — Plan of Care (Addendum)
Problem: Education: Goal: Understanding of CV disease, CV risk reduction, and recovery process will improve Outcome: Not Progressing Will continue to provide education regarding CV disease, peri-op, surgery, post op process. Patient not accepting diagnosis, still awaiting on spouse to get here. Patient refused to watch surgery video or book about CABG when offered at this time.

## 2016-07-16 NOTE — Interval H&P Note (Signed)
History and Physical Interval Note:  07/16/2016 7:22 AM  Edward Farmer  has presented today for surgery, with the diagnosis of cp/DOE- concerning for Unstable Angina.    The various methods of treatment have been discussed with the patient and family. After consideration of risks, benefits and other options for treatment, the patient has consented to  Procedure(s): Left Heart Cath and Coronary Angiography (N/A) with possible Percutaneous Coronary Intervention as a surgical intervention .  The patient's history has been reviewed, patient examined, no change in status, stable for surgery.  I have reviewed the patient's chart and labs.  Questions were answered to the patient's satisfaction.    Cath Lab Visit (complete for each Cath Lab visit)  Clinical Evaluation Leading to the Procedure:   ACS: Yes.    Non-ACS:    Anginal Classification: CCS IV  Anti-ischemic medical therapy: Maximal Therapy (2 or more classes of medications)  Non-Invasive Test Results: No non-invasive testing performed  Prior CABG: No previous CABG  Bryan Lemmaavid Harding

## 2016-07-17 ENCOUNTER — Inpatient Hospital Stay (HOSPITAL_COMMUNITY): Payer: Self-pay

## 2016-07-17 ENCOUNTER — Encounter (HOSPITAL_COMMUNITY): Payer: Self-pay | Admitting: Certified Registered Nurse Anesthetist

## 2016-07-17 ENCOUNTER — Inpatient Hospital Stay (HOSPITAL_COMMUNITY)
Admission: EM | Disposition: A | Discharge status: Home / Self Care | Payer: Self-pay | Source: Home / Self Care | Attending: Cardiothoracic Surgery

## 2016-07-17 ENCOUNTER — Inpatient Hospital Stay (HOSPITAL_COMMUNITY): Payer: Self-pay | Admitting: Certified Registered Nurse Anesthetist

## 2016-07-17 DIAGNOSIS — I25110 Atherosclerotic heart disease of native coronary artery with unstable angina pectoris: Secondary | ICD-10-CM

## 2016-07-17 DIAGNOSIS — Z951 Presence of aortocoronary bypass graft: Secondary | ICD-10-CM | POA: Diagnosis present

## 2016-07-17 HISTORY — PX: TEE WITHOUT CARDIOVERSION: SHX5443

## 2016-07-17 HISTORY — PX: CORONARY ARTERY BYPASS GRAFT: SHX141

## 2016-07-17 LAB — GLUCOSE, CAPILLARY
GLUCOSE-CAPILLARY: 102 mg/dL — AB (ref 65–99)
GLUCOSE-CAPILLARY: 105 mg/dL — AB (ref 65–99)
GLUCOSE-CAPILLARY: 110 mg/dL — AB (ref 65–99)
GLUCOSE-CAPILLARY: 112 mg/dL — AB (ref 65–99)
GLUCOSE-CAPILLARY: 116 mg/dL — AB (ref 65–99)
Glucose-Capillary: 115 mg/dL — ABNORMAL HIGH (ref 65–99)
Glucose-Capillary: 112 mg/dL — ABNORMAL HIGH (ref 65–99)
Glucose-Capillary: 112 mg/dL — ABNORMAL HIGH (ref 65–99)

## 2016-07-17 LAB — BLOOD GAS, ARTERIAL
Acid-base deficit: 0 mmol/L (ref 0.0–2.0)
Bicarbonate: 23.5 mmol/L (ref 20.0–28.0)
Drawn by: 10006
FIO2: 21
O2 Saturation: 92.8 %
Patient temperature: 98.6
pCO2 arterial: 34.1 mmHg (ref 32.0–48.0)
pH, Arterial: 7.453 — ABNORMAL HIGH (ref 7.350–7.450)
pO2, Arterial: 65.7 mmHg — ABNORMAL LOW (ref 83.0–108.0)

## 2016-07-17 LAB — CBC
HCT: 40.9 % (ref 39.0–52.0)
HCT: 35.9 % — ABNORMAL LOW (ref 39.0–52.0)
HCT: 37.9 % — ABNORMAL LOW (ref 39.0–52.0)
HEMOGLOBIN: 11.6 g/dL — AB (ref 13.0–17.0)
Hemoglobin: 13.1 g/dL (ref 13.0–17.0)
Hemoglobin: 12.1 g/dL — ABNORMAL LOW (ref 13.0–17.0)
MCH: 28.1 pg (ref 26.0–34.0)
MCH: 28.2 pg (ref 26.0–34.0)
MCH: 28.3 pg (ref 26.0–34.0)
MCHC: 32 g/dL (ref 30.0–36.0)
MCHC: 32.3 g/dL (ref 30.0–36.0)
MCHC: 31.9 g/dL (ref 30.0–36.0)
MCV: 87.6 fL (ref 78.0–100.0)
MCV: 87.3 fL (ref 78.0–100.0)
MCV: 88.6 fL (ref 78.0–100.0)
Platelets: 215 10*3/uL (ref 150–400)
Platelets: 167 10*3/uL (ref 150–400)
Platelets: 178 10*3/uL (ref 150–400)
RBC: 4.67 MIL/uL (ref 4.22–5.81)
RBC: 4.11 MIL/uL — AB (ref 4.22–5.81)
RBC: 4.28 MIL/uL (ref 4.22–5.81)
RDW: 15.4 % (ref 11.5–15.5)
RDW: 15.5 % (ref 11.5–15.5)
RDW: 15.9 % — ABNORMAL HIGH (ref 11.5–15.5)
WBC: 12.7 10*3/uL — ABNORMAL HIGH (ref 4.0–10.5)
WBC: 9.1 10*3/uL (ref 4.0–10.5)
WBC: 12.8 10*3/uL — ABNORMAL HIGH (ref 4.0–10.5)

## 2016-07-17 LAB — POCT I-STAT 3, ART BLOOD GAS (G3+)
ACID-BASE DEFICIT: 2 mmol/L (ref 0.0–2.0)
ACID-BASE DEFICIT: 3 mmol/L — AB (ref 0.0–2.0)
ACID-BASE DEFICIT: 5 mmol/L — AB (ref 0.0–2.0)
Acid-base deficit: 3 mmol/L — ABNORMAL HIGH (ref 0.0–2.0)
Acid-base deficit: 3 mmol/L — ABNORMAL HIGH (ref 0.0–2.0)
BICARBONATE: 22.6 mmol/L (ref 20.0–28.0)
BICARBONATE: 22.5 mmol/L (ref 20.0–28.0)
Bicarbonate: 23.8 mmol/L (ref 20.0–28.0)
Bicarbonate: 22.3 mmol/L (ref 20.0–28.0)
Bicarbonate: 18.4 mmol/L — ABNORMAL LOW (ref 20.0–28.0)
O2 SAT: 100 %
O2 SAT: 93 %
O2 SAT: 93 %
O2 SAT: 90 %
O2 Saturation: 95 %
PCO2 ART: 42.7 mmHg (ref 32.0–48.0)
PCO2 ART: 36.7 mmHg (ref 32.0–48.0)
PH ART: 7.381 (ref 7.350–7.450)
PH ART: 7.357 (ref 7.350–7.450)
PO2 ART: 334 mmHg — AB (ref 83.0–108.0)
PO2 ART: 69 mmHg — AB (ref 83.0–108.0)
PO2 ART: 66 mmHg — AB (ref 83.0–108.0)
PO2 ART: 58 mmHg — AB (ref 83.0–108.0)
Patient temperature: 34.9
Patient temperature: 35.8
Patient temperature: 37.4
TCO2: 25 mmol/L (ref 0–100)
TCO2: 23 mmol/L (ref 0–100)
TCO2: 24 mmol/L (ref 0–100)
TCO2: 24 mmol/L (ref 0–100)
TCO2: 19 mmol/L (ref 0–100)
pCO2 arterial: 39.3 mmHg (ref 32.0–48.0)
pCO2 arterial: 39.6 mmHg (ref 32.0–48.0)
pCO2 arterial: 29.6 mmHg — ABNORMAL LOW (ref 32.0–48.0)
pH, Arterial: 7.354 (ref 7.350–7.450)
pH, Arterial: 7.358 (ref 7.350–7.450)
pH, Arterial: 7.402 (ref 7.350–7.450)
pO2, Arterial: 62 mmHg — ABNORMAL LOW (ref 83.0–108.0)

## 2016-07-17 LAB — POCT I-STAT, CHEM 8
BUN: 12 mg/dL (ref 6–20)
BUN: 10 mg/dL (ref 6–20)
BUN: 10 mg/dL (ref 6–20)
BUN: 10 mg/dL (ref 6–20)
CALCIUM ION: 1.14 mmol/L — AB (ref 1.15–1.40)
CALCIUM ION: 1.15 mmol/L (ref 1.15–1.40)
CALCIUM ION: 1.15 mmol/L (ref 1.15–1.40)
CALCIUM ION: 1.17 mmol/L (ref 1.15–1.40)
CHLORIDE: 108 mmol/L (ref 101–111)
CREATININE: 1.1 mg/dL (ref 0.61–1.24)
Chloride: 106 mmol/L (ref 101–111)
Chloride: 108 mmol/L (ref 101–111)
Chloride: 112 mmol/L — ABNORMAL HIGH (ref 101–111)
Creatinine, Ser: 0.8 mg/dL (ref 0.61–1.24)
Creatinine, Ser: 0.9 mg/dL (ref 0.61–1.24)
Creatinine, Ser: 0.9 mg/dL (ref 0.61–1.24)
GLUCOSE: 106 mg/dL — AB (ref 65–99)
GLUCOSE: 111 mg/dL — AB (ref 65–99)
Glucose, Bld: 105 mg/dL — ABNORMAL HIGH (ref 65–99)
Glucose, Bld: 110 mg/dL — ABNORMAL HIGH (ref 65–99)
HCT: 39 % (ref 39.0–52.0)
HCT: 34 % — ABNORMAL LOW (ref 39.0–52.0)
HCT: 37 % — ABNORMAL LOW (ref 39.0–52.0)
HEMATOCRIT: 35 % — AB (ref 39.0–52.0)
HEMOGLOBIN: 13.3 g/dL (ref 13.0–17.0)
HEMOGLOBIN: 11.9 g/dL — AB (ref 13.0–17.0)
HEMOGLOBIN: 12.6 g/dL — AB (ref 13.0–17.0)
Hemoglobin: 11.6 g/dL — ABNORMAL LOW (ref 13.0–17.0)
Potassium: 4.2 mmol/L (ref 3.5–5.1)
Potassium: 3.9 mmol/L (ref 3.5–5.1)
Potassium: 4 mmol/L (ref 3.5–5.1)
Potassium: 4.6 mmol/L (ref 3.5–5.1)
SODIUM: 141 mmol/L (ref 135–145)
SODIUM: 142 mmol/L (ref 135–145)
Sodium: 142 mmol/L (ref 135–145)
Sodium: 141 mmol/L (ref 135–145)
TCO2: 27 mmol/L (ref 0–100)
TCO2: 25 mmol/L (ref 0–100)
TCO2: 27 mmol/L (ref 0–100)
TCO2: 21 mmol/L (ref 0–100)

## 2016-07-17 LAB — POCT I-STAT 4, (NA,K, GLUC, HGB,HCT)
Glucose, Bld: 101 mg/dL — ABNORMAL HIGH (ref 65–99)
HEMATOCRIT: 37 % — AB (ref 39.0–52.0)
HEMOGLOBIN: 12.6 g/dL — AB (ref 13.0–17.0)
POTASSIUM: 3.7 mmol/L (ref 3.5–5.1)
SODIUM: 142 mmol/L (ref 135–145)

## 2016-07-17 LAB — CREATININE, SERUM
Creatinine, Ser: 0.96 mg/dL (ref 0.61–1.24)
GFR calc Af Amer: >60 mL/min (ref >60)
GFR calc non Af Amer: >60 mL/min (ref >60)

## 2016-07-17 LAB — HEMOGLOBIN A1C
Hgb A1c MFr Bld: 5.8 % — ABNORMAL HIGH (ref 4.8–5.6)
Mean Plasma Glucose: 120 mg/dL

## 2016-07-17 LAB — SPIROMETRY WITH GRAPH
FEF 25-75 Pre: 1.58 L/sec
FEF2575-%Pred-Pre: 53 %
FEV1-%Pred-Pre: 57 %
FEV1-Pre: 1.77 L
FEV1FVC-%Pred-Pre: 95 %
FEV6-%Pred-Pre: 61 %
FEV6-Pre: 2.35 L
FEV6FVC-%Pred-Pre: 104 %
FVC-%Pred-Pre: 59 %
FVC-Pre: 2.35 L
Pre FEV1/FVC ratio: 75 %
Pre FEV6/FVC Ratio: 100 %

## 2016-07-17 LAB — BASIC METABOLIC PANEL
Anion gap: 13 (ref 5–15)
BUN: 8 mg/dL (ref 6–20)
CO2: 23 mmol/L (ref 22–32)
Calcium: 8.8 mg/dL — ABNORMAL LOW (ref 8.9–10.3)
Chloride: 102 mmol/L (ref 101–111)
Creatinine, Ser: 1.17 mg/dL (ref 0.61–1.24)
GFR calc Af Amer: >60 mL/min (ref >60)
GFR calc non Af Amer: >60 mL/min (ref >60)
Glucose, Bld: 104 mg/dL — ABNORMAL HIGH (ref 65–99)
Potassium: 3.7 mmol/L (ref 3.5–5.1)
Sodium: 138 mmol/L (ref 135–145)

## 2016-07-17 LAB — MAGNESIUM: Magnesium: 3 mg/dL — ABNORMAL HIGH (ref 1.7–2.4)

## 2016-07-17 LAB — APTT: aPTT: 30 seconds (ref 24–36)

## 2016-07-17 LAB — HEPARIN LEVEL (UNFRACTIONATED): Heparin Unfractionated: 0.13 IU/mL — ABNORMAL LOW (ref 0.30–0.70)

## 2016-07-17 LAB — PROTIME-INR
INR: 1.2
Prothrombin Time: 15.3 seconds — ABNORMAL HIGH (ref 11.4–15.2)

## 2016-07-17 SURGERY — CORONARY ARTERY BYPASS GRAFTING (CABG)
Anesthesia: General | Site: Chest

## 2016-07-17 MED ORDER — SODIUM CHLORIDE 0.9 % IV SOLN
INTRAVENOUS | Status: DC | PRN
Start: 2016-07-17 — End: 2016-07-19

## 2016-07-17 MED ORDER — SODIUM CHLORIDE 0.9 % IV SOLN
INTRAVENOUS | Status: DC | PRN
  Administered 2016-07-17: .9 [IU]/h via INTRAVENOUS

## 2016-07-17 MED ORDER — TRAMADOL HCL 50 MG PO TABS
50.0000 mg | ORAL_TABLET | ORAL | Status: DC | PRN
  Administered 2016-07-18: 100 mg via ORAL
  Filled 2016-07-17: qty 2

## 2016-07-17 MED ORDER — OXYCODONE HCL 5 MG PO TABS
5.0000 mg | ORAL_TABLET | ORAL | Status: DC | PRN
  Administered 2016-07-18 – 2016-07-19 (×2): 10 mg via ORAL
  Filled 2016-07-17 (×2): qty 2

## 2016-07-17 MED ORDER — METOPROLOL TARTRATE 5 MG/5ML IV SOLN: 2.5000 mg | INTRAVENOUS | Status: DC | PRN

## 2016-07-17 MED ORDER — SIMVASTATIN 20 MG PO TABS
20.0000 mg | ORAL_TABLET | Freq: Every day | ORAL | Status: DC
  Administered 2016-07-18 – 2016-07-21 (×4): 20 mg via ORAL
  Filled 2016-07-17 (×4): qty 1

## 2016-07-17 MED ORDER — ONDANSETRON HCL 4 MG/2ML IJ SOLN: 4.0000 mg | Freq: Four times a day (QID) | INTRAMUSCULAR | Status: DC | PRN

## 2016-07-17 MED ORDER — PHENYLEPHRINE HCL 10 MG/ML IJ SOLN
INTRAVENOUS | Status: DC | PRN
  Administered 2016-07-17: 15 ug/min via INTRAVENOUS

## 2016-07-17 MED ORDER — LACTATED RINGERS IV SOLN: INTRAVENOUS | Status: DC

## 2016-07-17 MED ORDER — DOCUSATE SODIUM 100 MG PO CAPS
200.0000 mg | ORAL_CAPSULE | Freq: Every day | ORAL | Status: DC
  Administered 2016-07-18: 200 mg via ORAL
  Filled 2016-07-17: qty 2

## 2016-07-17 MED ORDER — ROCURONIUM BROMIDE 50 MG/5ML IV SOSY
PREFILLED_SYRINGE | INTRAVENOUS | Status: AC
  Filled 2016-07-17: qty 5

## 2016-07-17 MED ORDER — FENTANYL CITRATE (PF) 100 MCG/2ML IJ SOLN
INTRAMUSCULAR | Status: DC | PRN
  Administered 2016-07-17: 50 ug via INTRAVENOUS
  Administered 2016-07-17: 200 ug via INTRAVENOUS
  Administered 2016-07-17: 150 ug via INTRAVENOUS
  Administered 2016-07-17: 200 ug via INTRAVENOUS
  Administered 2016-07-17: 100 ug via INTRAVENOUS
  Administered 2016-07-17: 50 ug via INTRAVENOUS
  Administered 2016-07-17 (×5): 100 ug via INTRAVENOUS

## 2016-07-17 MED ORDER — SODIUM BICARBONATE 8.4 % IV SOLN
25.0000 meq | Freq: Once | INTRAVENOUS | Status: AC
  Administered 2016-07-17: 25 meq via INTRAVENOUS

## 2016-07-17 MED ORDER — PANTOPRAZOLE SODIUM 40 MG PO TBEC: 40.0000 mg | DELAYED_RELEASE_TABLET | Freq: Every day | ORAL | Status: DC

## 2016-07-17 MED ORDER — MIDAZOLAM HCL 10 MG/2ML IJ SOLN
INTRAMUSCULAR | Status: AC
  Filled 2016-07-17: qty 2

## 2016-07-17 MED ORDER — PHENYLEPHRINE HCL 10 MG/ML IJ SOLN
INTRAMUSCULAR | Status: DC | PRN
  Administered 2016-07-17: 25 ug/min via INTRAVENOUS

## 2016-07-17 MED ORDER — ASPIRIN EC 325 MG PO TBEC
325.0000 mg | DELAYED_RELEASE_TABLET | Freq: Every day | ORAL | Status: DC
  Administered 2016-07-18: 325 mg via ORAL
  Filled 2016-07-17: qty 1

## 2016-07-17 MED ORDER — SODIUM CHLORIDE 0.9 % IV SOLN
INTRAVENOUS | Status: DC | PRN
  Administered 2016-07-17 (×2): via INTRAVENOUS

## 2016-07-17 MED ORDER — SODIUM CHLORIDE 0.9 % IV SOLN: INTRAVENOUS | Status: DC

## 2016-07-17 MED ORDER — FAMOTIDINE IN NACL 20-0.9 MG/50ML-% IV SOLN
20.0000 mg | Freq: Two times a day (BID) | INTRAVENOUS | Status: AC
  Administered 2016-07-17: 20 mg via INTRAVENOUS

## 2016-07-17 MED ORDER — INSULIN REGULAR HUMAN 100 UNIT/ML IJ SOLN
INTRAMUSCULAR | Status: DC
  Administered 2016-07-17: 0.9 [IU]/h via INTRAVENOUS
  Filled 2016-07-17 (×2): qty 2.5

## 2016-07-17 MED ORDER — INSULIN REGULAR BOLUS VIA INFUSION
0.0000 [IU] | Freq: Three times a day (TID) | INTRAVENOUS | Status: DC
  Filled 2016-07-17: qty 10

## 2016-07-17 MED ORDER — LACTATED RINGERS IV SOLN
INTRAVENOUS | Status: DC | PRN
  Administered 2016-07-17: 08:00:00 via INTRAVENOUS

## 2016-07-17 MED ORDER — CEFUROXIME SODIUM 1.5 G IJ SOLR
1.5000 g | Freq: Two times a day (BID) | INTRAMUSCULAR | Status: DC
  Administered 2016-07-17 – 2016-07-18 (×3): 1.5 g via INTRAVENOUS
  Filled 2016-07-17 (×4): qty 1.5

## 2016-07-17 MED ORDER — BISACODYL 10 MG RE SUPP: 10.0000 mg | Freq: Every day | RECTAL | Status: DC

## 2016-07-17 MED ORDER — MORPHINE SULFATE (PF) 2 MG/ML IV SOLN
2.0000 mg | INTRAVENOUS | Status: DC | PRN
  Administered 2016-07-17: 4 mg via INTRAVENOUS
  Administered 2016-07-17 (×2): 2 mg via INTRAVENOUS
  Administered 2016-07-17 – 2016-07-18 (×3): 4 mg via INTRAVENOUS
  Administered 2016-07-18 (×2): 2 mg via INTRAVENOUS
  Administered 2016-07-18: 4 mg via INTRAVENOUS
  Filled 2016-07-17 (×3): qty 2
  Filled 2016-07-17: qty 1
  Filled 2016-07-17: qty 2
  Filled 2016-07-17 (×2): qty 1
  Filled 2016-07-17: qty 2
  Filled 2016-07-17: qty 1

## 2016-07-17 MED ORDER — SODIUM CHLORIDE 0.9% FLUSH: 3.0000 mL | INTRAVENOUS | Status: DC | PRN

## 2016-07-17 MED ORDER — PROPOFOL 10 MG/ML IV BOLUS
INTRAVENOUS | Status: DC | PRN
  Administered 2016-07-17: 100 mg via INTRAVENOUS

## 2016-07-17 MED ORDER — HEPARIN SODIUM (PORCINE) 1000 UNIT/ML IJ SOLN
INTRAMUSCULAR | Status: DC | PRN
  Administered 2016-07-17: 9000 [IU] via INTRAVENOUS

## 2016-07-17 MED ORDER — MORPHINE SULFATE (PF) 2 MG/ML IV SOLN: 1.0000 mg | INTRAVENOUS | Status: DC | PRN

## 2016-07-17 MED ORDER — ACETAMINOPHEN 500 MG PO TABS
1000.0000 mg | ORAL_TABLET | Freq: Four times a day (QID) | ORAL | Status: DC
  Administered 2016-07-18 (×4): 1000 mg via ORAL
  Filled 2016-07-17 (×5): qty 2

## 2016-07-17 MED ORDER — METOPROLOL TARTRATE 25 MG/10 ML ORAL SUSPENSION: 12.5000 mg | Freq: Two times a day (BID) | ORAL | Status: DC

## 2016-07-17 MED ORDER — NITROGLYCERIN IN D5W 200-5 MCG/ML-% IV SOLN
0.0000 ug/min | INTRAVENOUS | Status: DC
  Administered 2016-07-18: 70 ug/min via INTRAVENOUS
  Filled 2016-07-17: qty 250

## 2016-07-17 MED ORDER — ROCURONIUM BROMIDE 100 MG/10ML IV SOLN
INTRAVENOUS | Status: DC | PRN
  Administered 2016-07-17: 20 mg via INTRAVENOUS
  Administered 2016-07-17: 100 mg via INTRAVENOUS
  Administered 2016-07-17: 50 mg via INTRAVENOUS

## 2016-07-17 MED ORDER — FENTANYL CITRATE (PF) 250 MCG/5ML IJ SOLN
INTRAMUSCULAR | Status: AC
  Filled 2016-07-17: qty 5

## 2016-07-17 MED ORDER — PROPOFOL 10 MG/ML IV BOLUS
INTRAVENOUS | Status: AC
  Filled 2016-07-17: qty 20

## 2016-07-17 MED ORDER — ALBUMIN HUMAN 5 % IV SOLN
250.0000 mL | INTRAVENOUS | Status: AC | PRN
  Administered 2016-07-17 (×2): 250 mL via INTRAVENOUS

## 2016-07-17 MED ORDER — MIDAZOLAM HCL 2 MG/2ML IJ SOLN
2.0000 mg | INTRAMUSCULAR | Status: DC | PRN
  Administered 2016-07-17: 2 mg via INTRAVENOUS
  Filled 2016-07-17: qty 2

## 2016-07-17 MED ORDER — METOPROLOL TARTRATE 12.5 MG HALF TABLET
12.5000 mg | ORAL_TABLET | Freq: Two times a day (BID) | ORAL | Status: DC
  Administered 2016-07-18 (×2): 12.5 mg via ORAL
  Filled 2016-07-17 (×2): qty 1

## 2016-07-17 MED ORDER — ALBUMIN HUMAN 5 % IV SOLN
INTRAVENOUS | Status: DC | PRN
  Administered 2016-07-17: 11:00:00 via INTRAVENOUS

## 2016-07-17 MED ORDER — ARTIFICIAL TEARS OP OINT
TOPICAL_OINTMENT | OPHTHALMIC | Status: DC | PRN
  Administered 2016-07-17: 1 via OPHTHALMIC

## 2016-07-17 MED ORDER — SODIUM CHLORIDE 0.9% FLUSH
3.0000 mL | Freq: Two times a day (BID) | INTRAVENOUS | Status: DC
  Administered 2016-07-18 (×2): 3 mL via INTRAVENOUS

## 2016-07-17 MED ORDER — ACETAMINOPHEN 650 MG RE SUPP
650.0000 mg | Freq: Once | RECTAL | Status: AC
  Administered 2016-07-17: 650 mg via RECTAL

## 2016-07-17 MED ORDER — ASPIRIN 81 MG PO CHEW: 324.0000 mg | CHEWABLE_TABLET | Freq: Every day | ORAL | Status: DC

## 2016-07-17 MED ORDER — VANCOMYCIN HCL IN DEXTROSE 1-5 GM/200ML-% IV SOLN
1000.0000 mg | Freq: Once | INTRAVENOUS | Status: AC
  Administered 2016-07-17: 1000 mg via INTRAVENOUS
  Filled 2016-07-17: qty 200

## 2016-07-17 MED ORDER — SODIUM CHLORIDE 0.45 % IV SOLN
INTRAVENOUS | Status: DC | PRN
  Administered 2016-07-17: 20 mL/h via INTRAVENOUS

## 2016-07-17 MED ORDER — ACETAMINOPHEN 160 MG/5ML PO SOLN: 650.0000 mg | Freq: Once | ORAL | Status: AC

## 2016-07-17 MED ORDER — ESMOLOL HCL 100 MG/10ML IV SOLN
INTRAVENOUS | Status: DC | PRN
  Administered 2016-07-17: 20 ug via INTRAVENOUS

## 2016-07-17 MED ORDER — LACTATED RINGERS IV SOLN: 500.0000 mL | Freq: Once | INTRAVENOUS | Status: DC | PRN

## 2016-07-17 MED ORDER — DEXMEDETOMIDINE HCL IN NACL 400 MCG/100ML IV SOLN
INTRAVENOUS | Status: DC | PRN
  Administered 2016-07-17: .3 ug/kg/h via INTRAVENOUS

## 2016-07-17 MED ORDER — MIDAZOLAM HCL 5 MG/5ML IJ SOLN
INTRAMUSCULAR | Status: DC | PRN
  Administered 2016-07-17 (×5): 2 mg via INTRAVENOUS

## 2016-07-17 MED ORDER — ESMOLOL HCL 100 MG/10ML IV SOLN
INTRAVENOUS | Status: AC
  Filled 2016-07-17: qty 10

## 2016-07-17 MED ORDER — EPHEDRINE 5 MG/ML INJ
INTRAVENOUS | Status: AC
  Filled 2016-07-17: qty 10

## 2016-07-17 MED ORDER — PROTAMINE SULFATE 10 MG/ML IV SOLN
INTRAVENOUS | Status: AC
  Filled 2016-07-17: qty 10

## 2016-07-17 MED ORDER — BISACODYL 5 MG PO TBEC
10.0000 mg | DELAYED_RELEASE_TABLET | Freq: Every day | ORAL | Status: DC
  Administered 2016-07-18: 10 mg via ORAL
  Filled 2016-07-17: qty 2

## 2016-07-17 MED ORDER — PHENYLEPHRINE 40 MCG/ML (10ML) SYRINGE FOR IV PUSH (FOR BLOOD PRESSURE SUPPORT)
PREFILLED_SYRINGE | INTRAVENOUS | Status: AC
  Filled 2016-07-17: qty 10

## 2016-07-17 MED ORDER — 0.9 % SODIUM CHLORIDE (POUR BTL) OPTIME
TOPICAL | Status: DC | PRN
  Administered 2016-07-17: 5000 mL

## 2016-07-17 MED ORDER — DEXTROSE 5 % IV SOLN
INTRAVENOUS | Status: DC | PRN
  Administered 2016-07-17: 1.5 g via INTRAVENOUS

## 2016-07-17 MED ORDER — VANCOMYCIN HCL 1000 MG IV SOLR
INTRAVENOUS | Status: DC | PRN
  Administered 2016-07-17: 1000 mg via INTRAVENOUS

## 2016-07-17 MED ORDER — ARTIFICIAL TEARS OP OINT
TOPICAL_OINTMENT | OPHTHALMIC | Status: AC
  Filled 2016-07-17: qty 3.5

## 2016-07-17 MED ORDER — NITROGLYCERIN IN D5W 200-5 MCG/ML-% IV SOLN
INTRAVENOUS | Status: DC | PRN
  Administered 2016-07-17: 30 ug/min via INTRAVENOUS

## 2016-07-17 MED ORDER — SODIUM CHLORIDE 0.9 % IV SOLN
0.0000 ug/min | INTRAVENOUS | Status: DC
  Filled 2016-07-17: qty 2

## 2016-07-17 MED ORDER — SODIUM CHLORIDE 0.9 % IV SOLN: 250.0000 mL | INTRAVENOUS | Status: DC

## 2016-07-17 MED ORDER — MAGNESIUM SULFATE 4 GM/100ML IV SOLN
4.0000 g | Freq: Once | INTRAVENOUS | Status: AC
Start: 2016-07-17 — End: 2016-07-17
  Administered 2016-07-17: 4 g via INTRAVENOUS
  Filled 2016-07-17: qty 100

## 2016-07-17 MED ORDER — LIDOCAINE HCL (CARDIAC) 20 MG/ML IV SOLN
INTRAVENOUS | Status: DC | PRN
  Administered 2016-07-17: 100 mg via INTRAVENOUS

## 2016-07-17 MED ORDER — ROCURONIUM BROMIDE 50 MG/5ML IV SOSY
PREFILLED_SYRINGE | INTRAVENOUS | Status: AC
  Filled 2016-07-17: qty 10

## 2016-07-17 MED ORDER — HEMOSTATIC AGENTS (NO CHARGE) OPTIME
TOPICAL | Status: DC | PRN
  Administered 2016-07-17: 1 via TOPICAL

## 2016-07-17 MED ORDER — ACETAMINOPHEN 160 MG/5ML PO SOLN: 1000.0000 mg | Freq: Four times a day (QID) | ORAL | Status: DC

## 2016-07-17 MED ORDER — LACTATED RINGERS IV SOLN
INTRAVENOUS | Status: DC | PRN
  Administered 2016-07-17 (×2): via INTRAVENOUS

## 2016-07-17 MED ORDER — STERILE WATER FOR IRRIGATION IR SOLN
Status: DC | PRN
  Administered 2016-07-17: 2000 mL

## 2016-07-17 MED ORDER — CHLORHEXIDINE GLUCONATE 0.12 % MT SOLN
15.0000 mL | OROMUCOSAL | Status: AC
  Administered 2016-07-17: 15 mL via OROMUCOSAL

## 2016-07-17 MED ORDER — PHENYLEPHRINE HCL 10 MG/ML IJ SOLN
INTRAMUSCULAR | Status: DC | PRN
  Administered 2016-07-17: 80 ug via INTRAVENOUS

## 2016-07-17 MED ORDER — FENTANYL CITRATE (PF) 250 MCG/5ML IJ SOLN
INTRAMUSCULAR | Status: AC
  Filled 2016-07-17: qty 25

## 2016-07-17 MED ORDER — INSULIN ASPART 100 UNIT/ML ~~LOC~~ SOLN
0.0000 [IU] | SUBCUTANEOUS | Status: DC
  Administered 2016-07-18 (×2): 2 [IU] via SUBCUTANEOUS

## 2016-07-17 MED ORDER — LACTATED RINGERS IV SOLN: INTRAVENOUS | Status: DC | PRN

## 2016-07-17 MED ORDER — DEXMEDETOMIDINE HCL IN NACL 200 MCG/50ML IV SOLN
0.0000 ug/kg/h | INTRAVENOUS | Status: DC
  Administered 2016-07-17: 0.5 ug/kg/h via INTRAVENOUS
  Filled 2016-07-17: qty 50

## 2016-07-17 MED ORDER — LEVALBUTEROL HCL 0.63 MG/3ML IN NEBU
0.6300 mg | INHALATION_SOLUTION | Freq: Four times a day (QID) | RESPIRATORY_TRACT | Status: DC
  Administered 2016-07-17 – 2016-07-20 (×12): 0.63 mg via RESPIRATORY_TRACT
  Filled 2016-07-17 (×13): qty 3

## 2016-07-17 MED ORDER — SODIUM CHLORIDE 0.9 % IV SOLN
30.0000 meq | Freq: Once | INTRAVENOUS | Status: AC
  Administered 2016-07-17: 30 meq via INTRAVENOUS
  Filled 2016-07-17: qty 15

## 2016-07-17 MED ORDER — SODIUM CHLORIDE 0.9 % IJ SOLN
OROMUCOSAL | Status: DC | PRN
  Administered 2016-07-17 (×3): 4 mL via TOPICAL

## 2016-07-17 MED ORDER — PROTAMINE SULFATE 10 MG/ML IV SOLN
INTRAVENOUS | Status: DC | PRN
  Administered 2016-07-17: 90 mg via INTRAVENOUS

## 2016-07-17 MED FILL — Magnesium Sulfate Inj 50%: INTRAMUSCULAR | Qty: 10 | Status: AC

## 2016-07-17 MED FILL — Potassium Chloride Inj 2 mEq/ML: INTRAVENOUS | Qty: 40 | Status: AC

## 2016-07-17 MED FILL — Heparin Sodium (Porcine) Inj 1000 Unit/ML: INTRAMUSCULAR | Qty: 30 | Status: AC

## 2016-07-17 MED FILL — Sodium Chloride IV Soln 0.9%: INTRAVENOUS | Qty: 3000 | Status: AC

## 2016-07-17 SURGICAL SUPPLY — 67 items
APL SKNCLS STERI-STRIP NONHPOA (GAUZE/BANDAGES/DRESSINGS) ×2
BAG DECANTER FOR FLEXI CONT (MISCELLANEOUS) ×3 IMPLANT
BANDAGE ACE 4X5 VEL STRL LF (GAUZE/BANDAGES/DRESSINGS) ×2 IMPLANT
BANDAGE ACE 6X5 VEL STRL LF (GAUZE/BANDAGES/DRESSINGS) ×2 IMPLANT
BENZOIN TINCTURE PRP APPL 2/3 (GAUZE/BANDAGES/DRESSINGS) ×1 IMPLANT
BLADE STERNUM SYSTEM 6 (BLADE) ×3 IMPLANT
BLOWER MISTER CAL-MED (MISCELLANEOUS) ×1 IMPLANT
BNDG GAUZE ELAST 4 BULKY (GAUZE/BANDAGES/DRESSINGS) ×2 IMPLANT
CANISTER SUCT 3000ML PPV (MISCELLANEOUS) ×3 IMPLANT
CATH CPB KIT GERHARDT (MISCELLANEOUS) ×3 IMPLANT
CATH THORACIC 28FR (CATHETERS) ×3 IMPLANT
CONN ST 1/4X3/8  BEN (MISCELLANEOUS) ×1
CONN ST 1/4X3/8 BEN (MISCELLANEOUS) IMPLANT
CRADLE DONUT ADULT HEAD (MISCELLANEOUS) ×3 IMPLANT
DRAIN CHANNEL 28F RND 3/8 FF (WOUND CARE) ×3 IMPLANT
DRAPE CARDIOVASCULAR INCISE (DRAPES) ×3
DRAPE SLUSH/WARMER DISC (DRAPES) ×3 IMPLANT
DRAPE SRG 135X102X78XABS (DRAPES) ×2 IMPLANT
DRSG AQUACEL AG ADV 3.5X14 (GAUZE/BANDAGES/DRESSINGS) ×3 IMPLANT
ELECT BLADE 4.0 EZ CLEAN MEGAD (MISCELLANEOUS) ×3
ELECT REM PT RETURN 9FT ADLT (ELECTROSURGICAL) ×6
ELECTRODE BLDE 4.0 EZ CLN MEGD (MISCELLANEOUS) ×2 IMPLANT
ELECTRODE REM PT RTRN 9FT ADLT (ELECTROSURGICAL) ×4 IMPLANT
FELT TEFLON 1X6 (MISCELLANEOUS) ×5 IMPLANT
GAUZE SPONGE 4X4 12PLY STRL (GAUZE/BANDAGES/DRESSINGS) ×5 IMPLANT
GAUZE SPONGE 4X4 12PLY STRL LF (GAUZE/BANDAGES/DRESSINGS) ×1 IMPLANT
GLOVE BIO SURGEON STRL SZ 6.5 (GLOVE) ×14 IMPLANT
GLOVE BIOGEL PI IND STRL 6.5 (GLOVE) IMPLANT
GLOVE BIOGEL PI INDICATOR 6.5 (GLOVE) ×3
GOWN STRL REUS W/ TWL LRG LVL3 (GOWN DISPOSABLE) ×8 IMPLANT
GOWN STRL REUS W/TWL LRG LVL3 (GOWN DISPOSABLE) ×21
HEMOSTAT POWDER SURGIFOAM 1G (HEMOSTASIS) ×9 IMPLANT
HEMOSTAT SURGICEL 2X14 (HEMOSTASIS) ×3 IMPLANT
KIT BASIN OR (CUSTOM PROCEDURE TRAY) ×3 IMPLANT
KIT CATH SUCT 8FR (CATHETERS) ×3 IMPLANT
KIT ROOM TURNOVER OR (KITS) ×3 IMPLANT
KIT SUCTION CATH 14FR (SUCTIONS) ×6 IMPLANT
KIT VASOVIEW HEMOPRO VH 3000 (KITS) ×2 IMPLANT
LEAD PACING MYOCARDI (MISCELLANEOUS) ×4 IMPLANT
MARKER GRAFT CORONARY BYPASS (MISCELLANEOUS) ×7 IMPLANT
NS IRRIG 1000ML POUR BTL (IV SOLUTION) ×15 IMPLANT
OFFPUMP STABILIZER SUV (MISCELLANEOUS) ×1 IMPLANT
PACK OPEN HEART (CUSTOM PROCEDURE TRAY) ×3 IMPLANT
PAD ARMBOARD 7.5X6 YLW CONV (MISCELLANEOUS) ×6 IMPLANT
PAD ELECT DEFIB RADIOL ZOLL (MISCELLANEOUS) ×3 IMPLANT
PENCIL BUTTON HOLSTER BLD 10FT (ELECTRODE) ×3 IMPLANT
POSITIONER ACROBAT-I OFFPUMP (MISCELLANEOUS) ×1 IMPLANT
SUT BONE WAX W31G (SUTURE) ×3 IMPLANT
SUT PROLENE 3 0 SH1 36 (SUTURE) ×3 IMPLANT
SUT PROLENE 4 0 TF (SUTURE) ×6 IMPLANT
SUT PROLENE 6 0 CC (SUTURE) ×6 IMPLANT
SUT PROLENE 7 0 BV1 MDA (SUTURE) ×3 IMPLANT
SUT STEEL 6MS V (SUTURE) ×3 IMPLANT
SUT STEEL SZ 6 DBL 3X14 BALL (SUTURE) ×4 IMPLANT
SUT VIC AB 1 CTX 18 (SUTURE) ×6 IMPLANT
SUTURE E-PAK OPEN HEART (SUTURE) ×3 IMPLANT
SYSTEM SAHARA CHEST DRAIN ATS (WOUND CARE) ×3 IMPLANT
TAPE CLOTH SURG 4X10 WHT LF (GAUZE/BANDAGES/DRESSINGS) ×1 IMPLANT
TAPES RETRACTO (MISCELLANEOUS) ×1 IMPLANT
TOWEL GREEN STERILE (TOWEL DISPOSABLE) ×9 IMPLANT
TOWEL GREEN STERILE FF (TOWEL DISPOSABLE) ×4 IMPLANT
TOWEL OR 17X24 6PK STRL BLUE (TOWEL DISPOSABLE) ×4 IMPLANT
TOWEL OR 17X26 10 PK STRL BLUE (TOWEL DISPOSABLE) ×4 IMPLANT
TRAY FOLEY IC TEMP SENS 16FR (CATHETERS) ×3 IMPLANT
TUBING INSUFFLATION (TUBING) ×2 IMPLANT
UNDERPAD 30X30 (UNDERPADS AND DIAPERS) ×3 IMPLANT
WATER STERILE IRR 1000ML POUR (IV SOLUTION) ×6 IMPLANT

## 2016-07-17 NOTE — Anesthesia Procedure Notes (Signed)
Procedure Name: Intubation Date/Time: 07/17/2016 8:47 AM Performed by: Julieta Bellini Pre-anesthesia Checklist: Patient identified, Emergency Drugs available, Suction available, Patient being monitored and Timeout performed Patient Re-evaluated:Patient Re-evaluated prior to inductionOxygen Delivery Method: Circle system utilized Preoxygenation: Pre-oxygenation with 100% oxygen Intubation Type: IV induction Ventilation: Oral airway inserted - appropriate to patient size Laryngoscope Size: Sabra Heck and 2 Grade View: Grade III Tube type: Oral Tube size: 8.0 mm Number of attempts: 2 Airway Equipment and Method: Stylet Placement Confirmation: ETT inserted through vocal cords under direct vision,  breath sounds checked- equal and bilateral and positive ETCO2 Secured at: 23 cm Tube secured with: Tape Dental Injury: Teeth and Oropharynx as per pre-operative assessment  Difficulty Due To: Difficulty was anticipated Comments: DL x 1 with MAC 3- no view. DL again by Tobias Alexander with Sabra Heck 2- grade 3 view. Recommend glidescope in future.

## 2016-07-17 NOTE — Progress Notes (Signed)
07/17/2016- 2100- Pulmonary mechanics done-  NIF -50, VT 500, FVC 850

## 2016-07-17 NOTE — Brief Op Note (Addendum)
      [address]       [name] [id number]             [phone]      07/17/2016  11:27 AM  PATIENT:  Edward Farmer  56 y.o. male  PRE-OPERATIVE DIAGNOSIS:  Severe single vessel CAD  POST-OPERATIVE DIAGNOSIS:  Severe single vessel CAD  PROCEDURE:  TRANSESOPHAGEAL ECHOCARDIOGRAM (TEE) MEDIAN STERNOTOMY for CORONARY ARTERY BYPASS GRAFTING (CABG) x 1 (LIMA to LAD) OFF PUMP   SURGEON:  Surgeon(s) and Role:    * Delight OvensEdward B Trinity Hyland, MD - Primary  PHYSICIAN ASSISTANT: Doree Fudgeonielle Zimmerman PA-C  ANESTHESIA:   general  EBL:  Total I/O In: 1750 [I.V.:1500; IV Piggyback:250] Out: 225 [Urine:225]  BLOOD ADMINISTERED:none  DRAINS: Chest tubes placed in the mediastinal and pleural spaces   COUNTS CORRECT:  YES  DICTATION: .Dragon Dictation  PLAN OF CARE: Admit to inpatient   PATIENT DISPOSITION:  ICU - intubated and hemodynamically stable.   Delay start of Pharmacological VTE agent (>24hrs) due to surgical blood loss or risk of bleeding: yes  BASELINE WEIGHT: 95 kg

## 2016-07-17 NOTE — Progress Notes (Signed)
07/17/2016- 2145-Attempted aline placement on right radial- unable to thread catheter.  Sterile technique used for procedure.  Pt tolerated well.

## 2016-07-17 NOTE — Procedures (Signed)
Pt arrived to unit, CRNA states spo2 92% on 100% fio2, pt placed on 100% fio2 at this time, will wean as tolerated

## 2016-07-17 NOTE — Progress Notes (Signed)
Pt transported to short stay with transporter and this RN. Handoff report given to Edwina BarthPamela Hawks, RN at bedside. Pt switched to their telemetry monitors. Pt stable, not in distress.

## 2016-07-17 NOTE — Progress Notes (Signed)
OR called unit, notified RN that they'd pick up pt ~0715. They are aware a nurse will need to accompany pt.

## 2016-07-17 NOTE — Progress Notes (Signed)
[name] MD paged regarding patient's pre-extubation gas. Orders received to give half an amp of bicarb and proceed with extubation. Extubate to higher FiO2. Will continue to monitor.   Edward PorchBradley Waunetta Riggle

## 2016-07-17 NOTE — Progress Notes (Signed)
Dr. Tyrone SageGerhardt notified of ABG results, verbal order for 0.63 mg xopenex Q6, repeat ABG in one hour, ok to go down on FIO2 if O2 sat remains >90. RT notified, will continue to monitor.  Serena ColonelWilkinson, Naphtali Riede P, RN

## 2016-07-17 NOTE — Anesthesia Postprocedure Evaluation (Addendum)
Anesthesia Post Note  Patient: Edward Farmer  Procedure(s) Performed: Procedure(s) (LRB): CORONARY ARTERY BYPASS GRAFTING (CABG) x 1. OFF PUMP. LIMA TO LAD. (N/A) TRANSESOPHAGEAL ECHOCARDIOGRAM (TEE) (N/A)  Patient location during evaluation: SICU Anesthesia Type: General Level of consciousness: sedated Pain management: pain level controlled Vital Signs Assessment: post-procedure vital signs reviewed and stable Respiratory status: patient remains intubated per anesthesia plan Cardiovascular status: stable Anesthetic complications: no       Last Vitals:  Vitals:   07/17/16 0813 07/17/16 0814  BP:    Pulse: 82 89  Resp: 11 11  Temp:      Last Pain:  Vitals:   07/17/16 0500  TempSrc: Oral  PainSc:                  Edward Farmer

## 2016-07-17 NOTE — Progress Notes (Signed)
ANTICOAGULATION CONSULT NOTE - Follow Up Consult  Pharmacy Consult for Heparin  Indication: Multi-vessel disease, awaiting CABG this AM  Allergies  Allergen Reactions  . No Known Allergies     Patient Measurements: Height: 5\' 9"  (175.3 cm) Weight: 210 lb 1.6 oz (95.3 kg) IBW/kg (Calculated) : 70.7 Vital Signs: Temp: 97.8 F (36.6 C) (03/13 1934) Temp Source: Oral (03/13 1934) BP: 109/59 (03/14 0030) Pulse Rate: 81 (03/14 0030)  Labs:  Recent Labs  07/14/16 1642 07/14/16 2045 07/14/16 2336 07/15/16 0225 07/16/16 0222 07/16/16 2133  HGB 12.4*  --   --  11.6* 12.5*  --   HCT 37.6*  --   --  36.0* 38.5*  --   PLT 206  --   --  195 206  --   LABPROT  --   --   --   --  13.3  --   INR  --   --   --   --  1.01  --   HEPARINUNFRC  --   --   --   --   --  0.13*  CREATININE 1.29*  --   --  1.11 1.04  --   TROPONINI  --  <0.03 <0.03 0.03*  --   --     Estimated Creatinine Clearance: 90.3 mL/min (by C-G formula based on SCr of 1.04 mg/dL).   Assessment: CABG this AM, heparin level is low at 0.13, no issues per RN.   Goal of Therapy:  Heparin level 0.3-0.7 units/ml Monitor platelets by anticoagulation protocol: Yes   Plan:  -Inc heparin to 1400 units/hr -Heparin off later this AM for CABG  Abran DukeLedford, Jakorey Mcconathy 07/17/2016,1:11 AM

## 2016-07-17 NOTE — Progress Notes (Signed)
Patient ID: Edward Farmer, male   DOB: 09-28-1960, 56 y.o.   MRN: [id number] EVENING ROUNDS NOTE :     [address]       [place] [id number]             [phone]                 Day of Surgery Procedure(s) (LRB): CORONARY ARTERY BYPASS GRAFTING (CABG) x 1. OFF PUMP. LIMA TO LAD. (N/A) TRANSESOPHAGEAL ECHOCARDIOGRAM (TEE) (N/A)  Total Length of Stay:  LOS: 2 days  BP 123/76   Pulse 70   Temp 98.8 F (37.1 C)   Resp (!) 22   Ht 5\' 9"  (1.753 m)   Wt 209 lb 3.5 oz (94.9 kg)   SpO2 92%   BMI 30.90 kg/m   .Intake/Output      03/14 0701 - 03/15 0700   P.O.    I.V. (mL/kg) 2149.6 (22.7)   IV Piggyback 1165   Total Intake(mL/kg) 3314.6 (34.9)   Urine (mL/kg/hr) 780 (0.6)   Blood 80 (0.1)   Chest Tube 130 (0.1)   Total Output 990   Net +2324.6         . sodium chloride 20 mL/hr at 07/17/16 1900  . [START ON 07/18/2016] sodium chloride    . sodium chloride 10 mL/hr at 07/17/16 1900  . dexmedetomidine 0.5 mcg/kg/hr (07/17/16 1900)  . insulin (NOVOLIN-R) infusion 1 Units/hr (07/17/16 1900)  . lactated ringers 10 mL/hr at 07/17/16 1900  . lactated ringers    . nitroGLYCERIN 40 mcg/min (07/17/16 1900)  . phenylephrine (NEO-SYNEPHRINE) Adult infusion Stopped (07/17/16 1300)     Lab Results  Component Value Date   WBC 12.8 (H) 07/17/2016   HGB 12.6 (L) 07/17/2016   HCT 37.0 (L) 07/17/2016   PLT 178 07/17/2016   GLUCOSE 111 (H) 07/17/2016   CHOL 174 07/14/2016   TRIG 231 (H) 07/14/2016   HDL 34 (L) 07/14/2016   LDLCALC 94 07/14/2016   ALT 22 07/15/2016   AST 24 07/15/2016   NA 141 07/17/2016   K 4.6 07/17/2016   CL 112 (H) 07/17/2016   CREATININE 0.90 07/17/2016   BUN 10 07/17/2016   CO2 23 07/17/2016   TSH 0.856 07/29/2007   INR 1.20 07/17/2016   HGBA1C 5.8 (H) 07/16/2016   Still on vent, fio2 now weaned to 40  02 sat 92-94 bp stable Wean vent   Delight OvensEdward B Kasson [name] MD  Beeper [phone] Office [phone] 07/17/2016 7:58 PM

## 2016-07-17 NOTE — Transfer of Care (Signed)
Immediate Anesthesia Transfer of Care Note  Patient: Edward Farmer  Procedure(s) Performed: Procedure(s): CORONARY ARTERY BYPASS GRAFTING (CABG) x 1. OFF PUMP. LIMA TO LAD. (N/A) TRANSESOPHAGEAL ECHOCARDIOGRAM (TEE) (N/A)  Patient Location: SICU  Anesthesia Type:General  Level of Consciousness: sedated and Patient remains intubated per anesthesia plan  Airway & Oxygen Therapy: Patient remains intubated per anesthesia plan and Patient placed on Ventilator (see vital sign flow sheet for setting)  Post-op Assessment: Report given to RN and Post -op Vital signs reviewed and stable  Post vital signs: Reviewed and stable  Last Vitals:  Vitals:   07/17/16 0813 07/17/16 0814  BP:    Pulse: 82 89  Resp: 11 11  Temp:      Last Pain:  Vitals:   07/17/16 0500  TempSrc: Oral  PainSc:       Patients Stated Pain Goal: 3 (96/28/36 6294)  Complications: No apparent anesthesia complications    Pt VSS during transport to ICU. Pt met my ICU RN and RT. Report given to RN/RT and all questions answered. Pt connected to ICU monitors and continued stability confirmed.

## 2016-07-17 NOTE — Progress Notes (Signed)
      [address]       [name] [id number]             [phone]     Pre Procedure note for inpatients:   Edward Farmer has been scheduled for Procedure(s): CORONARY ARTERY BYPASS GRAFTING (CABG) (N/A) TRANSESOPHAGEAL ECHOCARDIOGRAM (TEE) (N/A) today. The various methods of treatment have been discussed with the patient. After consideration of the risks, benefits and treatment options the patient has consented to the planned procedure.   The patient has been seen and labs reviewed. There are no changes in the patient's condition to prevent proceeding with the planned procedure today.  Recent labs:  Lab Results  Component Value Date   WBC 12.7 (H) 07/17/2016   HGB 13.1 07/17/2016   HCT 40.9 07/17/2016   PLT 215 07/17/2016   GLUCOSE 104 (H) 07/17/2016   CHOL 174 07/14/2016   TRIG 231 (H) 07/14/2016   HDL 34 (L) 07/14/2016   LDLCALC 94 07/14/2016   ALT 22 07/15/2016   AST 24 07/15/2016   NA 138 07/17/2016   K 3.7 07/17/2016   CL 102 07/17/2016   CREATININE 1.17 07/17/2016   BUN 8 07/17/2016   CO2 23 07/17/2016   TSH 0.856 07/29/2007   INR 1.01 07/16/2016   HGBA1C 5.8 (H) 07/16/2016    Edward OvensEdward B Ranulfo Kall, MD 07/17/2016 7:32 AM

## 2016-07-17 NOTE — Progress Notes (Signed)
  [place] TEAM 1 - Stepdown/ICU TEAM  Pt known to me from visit 07/15/2016.  Interim hx reviewed.  Noted cath findings, and plan for CABG today.  Pt is presently in Pre-Op unit.  As per the custom, [facility] will be the attending of record post surgery.  [facility] will sign off, but is happy to see the pt in f/u if the new primary service so desires.    Please call the Team 1 MD as listed in [email or web address] should you desire f/u w/ [facility].  Thanks to the Cardiology and [facility] Teams for their expert care of this gentleman.  Lonia BloodJeffrey T. McClung, MD [facility] Office  [phone] Pager - Text Page per Amion as per below:  On-Call/Text Page:      [email or web address]      password [email or web address]  If 7PM-7AM, please contact night-coverage [email or web address] Password [email or web address] 07/17/2016, 8:35 AM

## 2016-07-17 NOTE — Anesthesia Procedure Notes (Signed)
Central Venous Catheter Insertion Performed by: Heather RobertsSINGER, Edward Farmer, anesthesiologist Start/End3/14/2018 8:02 AM, 07/17/2016 8:12 AM Patient location: Pre-op. Preanesthetic checklist: patient identified, IV checked, site marked, risks and benefits discussed, surgical consent, monitors and equipment checked, pre-op evaluation, timeout performed and anesthesia consent Position: Trendelenburg Lidocaine 1% used for infiltration and patient sedated Hand hygiene performed , maximum sterile barriers used  and Seldinger technique used Catheter size: 8.5 Fr Total catheter length 8. PA cath was placed.Sheath introducer Swan type:thermodilution PA Cath depth:50 Procedure performed using ultrasound guided technique. Ultrasound Notes:anatomy identified, needle tip was noted to be adjacent to the nerve/plexus identified, no ultrasound evidence of intravascular and/or intraneural injection and image(s) printed for medical record Attempts: 1 Following insertion, line sutured and dressing applied. Post procedure assessment: free fluid flow, blood return through all ports and no air  Patient tolerated the procedure well with no immediate complications.

## 2016-07-17 NOTE — Anesthesia Postprocedure Evaluation (Deleted)
Anesthesia Post Note  Patient: Jase E Paradis  Procedure(s) Performed: Procedure(s) (LRB): CORONARY ARTERY BYPASS GRAFTING (CABG) x 1. OFF PUMP. LIMA TO LAD. (N/A) TRANSESOPHAGEAL ECHOCARDIOGRAM (TEE) (N/A)  Patient location during evaluation: SICU Anesthesia Type: General Level of consciousness: sedated Pain management: pain level controlled Vital Signs Assessment: post-procedure vital signs reviewed and stable Respiratory status: patient remains intubated per anesthesia plan Cardiovascular status: stable Anesthetic complications: no       Last Vitals:  Vitals:   07/17/16 0813 07/17/16 0814  BP:    Pulse: 82 89  Resp: 11 11  Temp:      Last Pain:  Vitals:   07/17/16 0500  TempSrc: Oral  PainSc:                  Mila Pair DANIEL     

## 2016-07-17 NOTE — Progress Notes (Deleted)
Pt occasionally desats into low 80s sO2% while asleep. Refuses to wear [place] at this time despite education. Aware that he'll likely have [place] post-op; he is okay with that. Will continue to monitor.

## 2016-07-17 NOTE — Progress Notes (Signed)
  Echocardiogram Echocardiogram Transesophageal has been performed.  Edward Farmer 07/17/2016, 9:51 AM 

## 2016-07-18 ENCOUNTER — Inpatient Hospital Stay (HOSPITAL_COMMUNITY): Payer: Self-pay

## 2016-07-18 ENCOUNTER — Encounter (HOSPITAL_COMMUNITY): Payer: Self-pay | Admitting: Cardiothoracic Surgery

## 2016-07-18 DIAGNOSIS — I200 Unstable angina: Secondary | ICD-10-CM

## 2016-07-18 LAB — BASIC METABOLIC PANEL
Anion gap: 7 (ref 5–15)
BUN: 9 mg/dL (ref 6–20)
CALCIUM: 8 mg/dL — AB (ref 8.9–10.3)
CO2: 22 mmol/L (ref 22–32)
Chloride: 111 mmol/L (ref 101–111)
Creatinine, Ser: 1.09 mg/dL (ref 0.61–1.24)
GFR calc non Af Amer: >60 mL/min (ref >60)
Glucose, Bld: 114 mg/dL — ABNORMAL HIGH (ref 65–99)
Potassium: 4.1 mmol/L (ref 3.5–5.1)
SODIUM: 140 mmol/L (ref 135–145)

## 2016-07-18 LAB — POCT I-STAT, CHEM 8
BUN: 11 mg/dL (ref 6–20)
CALCIUM ION: 1.18 mmol/L (ref 1.15–1.40)
CREATININE: 1 mg/dL (ref 0.61–1.24)
Chloride: 103 mmol/L (ref 101–111)
Glucose, Bld: 98 mg/dL (ref 65–99)
HEMATOCRIT: 32 % — AB (ref 39.0–52.0)
HEMOGLOBIN: 10.9 g/dL — AB (ref 13.0–17.0)
Potassium: 3.9 mmol/L (ref 3.5–5.1)
SODIUM: 137 mmol/L (ref 135–145)
TCO2: 18 mmol/L (ref 0–100)

## 2016-07-18 LAB — POCT I-STAT 3, ART BLOOD GAS (G3+)
ACID-BASE DEFICIT: 5 mmol/L — AB (ref 0.0–2.0)
Acid-base deficit: 6 mmol/L — ABNORMAL HIGH (ref 0.0–2.0)
BICARBONATE: 18.2 mmol/L — AB (ref 20.0–28.0)
Bicarbonate: 19.9 mmol/L — ABNORMAL LOW (ref 20.0–28.0)
O2 SAT: 92 %
O2 Saturation: 92 %
PO2 ART: 66 mmHg — AB (ref 83.0–108.0)
TCO2: 19 mmol/L (ref 0–100)
TCO2: 21 mmol/L (ref 0–100)
pCO2 arterial: 32 mmHg (ref 32.0–48.0)
pCO2 arterial: 35.9 mmHg (ref 32.0–48.0)
pH, Arterial: 7.356 (ref 7.350–7.450)
pH, Arterial: 7.366 (ref 7.350–7.450)
pO2, Arterial: 70 mmHg — ABNORMAL LOW (ref 83.0–108.0)

## 2016-07-18 LAB — CBC
HCT: 34 % — ABNORMAL LOW (ref 39.0–52.0)
HCT: 37.9 % — ABNORMAL LOW (ref 39.0–52.0)
Hemoglobin: 11 g/dL — ABNORMAL LOW (ref 13.0–17.0)
Hemoglobin: 12.2 g/dL — ABNORMAL LOW (ref 13.0–17.0)
MCH: 28.5 pg (ref 26.0–34.0)
MCH: 28.8 pg (ref 26.0–34.0)
MCHC: 32.4 g/dL (ref 30.0–36.0)
MCHC: 32.2 g/dL (ref 30.0–36.0)
MCV: 88.1 fL (ref 78.0–100.0)
MCV: 89.4 fL (ref 78.0–100.0)
PLATELETS: 151 10*3/uL (ref 150–400)
Platelets: 165 10*3/uL (ref 150–400)
RBC: 3.86 MIL/uL — ABNORMAL LOW (ref 4.22–5.81)
RBC: 4.24 MIL/uL (ref 4.22–5.81)
RDW: 15.8 % — AB (ref 11.5–15.5)
RDW: 15.9 % — AB (ref 11.5–15.5)
WBC: 13.7 10*3/uL — ABNORMAL HIGH (ref 4.0–10.5)
WBC: 20.3 10*3/uL — AB (ref 4.0–10.5)

## 2016-07-18 LAB — CREATININE, SERUM
CREATININE: 1.3 mg/dL — AB (ref 0.61–1.24)
GFR calc non Af Amer: 60 mL/min — ABNORMAL LOW (ref >60)

## 2016-07-18 LAB — GLUCOSE, CAPILLARY
GLUCOSE-CAPILLARY: 101 mg/dL — AB (ref 65–99)
Glucose-Capillary: 106 mg/dL — ABNORMAL HIGH (ref 65–99)
Glucose-Capillary: 115 mg/dL — ABNORMAL HIGH (ref 65–99)
Glucose-Capillary: 124 mg/dL — ABNORMAL HIGH (ref 65–99)
Glucose-Capillary: 109 mg/dL — ABNORMAL HIGH (ref 65–99)
Glucose-Capillary: 127 mg/dL — ABNORMAL HIGH (ref 65–99)

## 2016-07-18 LAB — MAGNESIUM
MAGNESIUM: 2.4 mg/dL (ref 1.7–2.4)
Magnesium: 2.3 mg/dL (ref 1.7–2.4)

## 2016-07-18 MED ORDER — SODIUM BICARBONATE 8.4 % IV SOLN
25.0000 meq | Freq: Once | INTRAVENOUS | Status: AC
  Administered 2016-07-18: 25 meq via INTRAVENOUS
  Filled 2016-07-18: qty 50

## 2016-07-18 MED ORDER — INSULIN ASPART 100 UNIT/ML ~~LOC~~ SOLN: 0.0000 [IU] | SUBCUTANEOUS | Status: DC

## 2016-07-18 MED ORDER — LISINOPRIL-HYDROCHLOROTHIAZIDE 20-25 MG PO TABS: 1.0000 | ORAL_TABLET | Freq: Every day | ORAL | Status: DC

## 2016-07-18 MED ORDER — KETOROLAC TROMETHAMINE 15 MG/ML IJ SOLN
15.0000 mg | Freq: Four times a day (QID) | INTRAMUSCULAR | Status: DC | PRN
  Administered 2016-07-18 – 2016-07-19 (×4): 15 mg via INTRAVENOUS
  Filled 2016-07-18 (×4): qty 1

## 2016-07-18 MED ORDER — ORAL CARE MOUTH RINSE
15.0000 mL | Freq: Two times a day (BID) | OROMUCOSAL | Status: DC
  Administered 2016-07-18 – 2016-07-22 (×8): 15 mL via OROMUCOSAL

## 2016-07-18 MED ORDER — FUROSEMIDE 10 MG/ML IJ SOLN
40.0000 mg | Freq: Once | INTRAMUSCULAR | Status: AC
  Administered 2016-07-18: 40 mg via INTRAVENOUS
  Filled 2016-07-18: qty 4

## 2016-07-18 MED ORDER — LISINOPRIL 10 MG PO TABS
20.0000 mg | ORAL_TABLET | Freq: Every day | ORAL | Status: DC
  Administered 2016-07-18 – 2016-07-22 (×5): 20 mg via ORAL
  Filled 2016-07-18 (×2): qty 2
  Filled 2016-07-18 (×2): qty 1
  Filled 2016-07-18: qty 2

## 2016-07-18 MED ORDER — ENOXAPARIN SODIUM 40 MG/0.4ML ~~LOC~~ SOLN
40.0000 mg | Freq: Every day | SUBCUTANEOUS | Status: DC
  Administered 2016-07-18 – 2016-07-21 (×4): 40 mg via SUBCUTANEOUS
  Filled 2016-07-18 (×4): qty 0.4

## 2016-07-18 MED ORDER — HYDROCHLOROTHIAZIDE 25 MG PO TABS
25.0000 mg | ORAL_TABLET | Freq: Every day | ORAL | Status: DC
  Administered 2016-07-18 – 2016-07-22 (×5): 25 mg via ORAL
  Filled 2016-07-18 (×5): qty 1

## 2016-07-18 NOTE — Discharge Instructions (Signed)
Coronary Artery Bypass Grafting, Care After ° °This sheet gives you information about how to care for yourself after your procedure. Your health care provider may also give you more specific instructions. If you have problems or questions, contact your health care provider. °What can I expect after the procedure? °After the procedure, it is common to have: °· Nausea and a lack of appetite. °· Constipation. °· Weakness and fatigue. °· Depression or irritability. °· Pain or discomfort in your incision areas. °Follow these instructions at home: °Medicines  °· Take over-the-counter and prescription medicines only as told by your health care provider. Do not stop taking medicines or start any new medicines without approval from your health care provider. °· If you were prescribed an antibiotic medicine, take it as told by your health care provider. Do not stop taking the antibiotic even if you start to feel better. °· Do not drive or use heavy machinery while taking prescription pain medicine. °Incision care  °· Follow instructions from your health care provider about how to take care of your incisions. Make sure you: °¨ Wash your hands with soap and water before you change your bandage (dressing). If soap and water are not available, use hand sanitizer. °¨ Change your dressing as told by your health care provider. °¨ Leave stitches (sutures), skin glue, or adhesive strips in place. These skin closures may need to stay in place for 2 weeks or longer. If adhesive strip edges start to loosen and curl up, you may trim the loose edges. Do not remove adhesive strips completely unless your health care provider tells you to do that. °· Keep incision areas clean, dry, and protected. °· Check your incision areas every day for signs of infection. Check for: °¨ More redness, swelling, or pain. °¨ More fluid or blood. °¨ Warmth. °¨ Pus or a bad smell. °· If incisions were made in your legs: °¨ Avoid crossing your legs. °¨ Avoid  sitting for long periods of time. Change positions every 30 minutes. °¨ Raise (elevate) your legs when you are sitting. °Bathing  °· Do not take baths, swim, or use a hot tub until your health care provider approves. °· Only take sponge baths. Pat the incisions dry. Do not rub incisions with a washcloth or towel. °· Ask your health care provider when you can shower. °Eating and drinking  °· Eat foods that are high in fiber, such as raw fruits and vegetables, whole grains, beans, and nuts. Meats should be lean cut. Avoid canned, processed, and fried foods. This can help prevent constipation and is a recommended part of a heart-healthy diet. °· Drink enough fluid to keep your urine clear or pale yellow. °· Limit alcohol intake to no more than 1 drink a day for nonpregnant women and 2 drinks a day for men. One drink equals 12 oz of beer, 5 oz of wine, or 1½ oz of hard liquor. °Activity  °· Rest and limit your activity as told by your health care provider. You may be instructed to: °¨ Stop any activity right away if you have chest pain, shortness of breath, irregular heartbeats, or dizziness. Get help right away if you have any of these symptoms. °¨ Move around frequently for short periods or take short walks as directed by your health care provider. Gradually increase your activities. You may need physical therapy or cardiac rehabilitation to help strengthen your muscles and build your endurance. °¨ Avoid lifting, pushing, or pulling anything that is heavier than 10   lb (4.5 kg) for at least 6 weeks or as told by your health care provider. °· Do not drive until your health care provider approves. °· Ask your health care provider when you may return to work. °· Ask your health care provider when you may resume sexual activity. °General instructions  °· Do not use any products that contain nicotine or tobacco, such as cigarettes and e-cigarettes. If you need help quitting, ask your health care provider. °· Take 2-3 deep  breaths every few hours during the day, while you recover. This helps expand your lungs and prevent complications like pneumonia after surgery. °· If you were given a device called an incentive spirometer, use it several times a day to practice deep breathing. Support your chest with a pillow or your arms when you take deep breaths or cough. °· Wear compression stockings as told by your health care provider. These stockings help to prevent blood clots and reduce swelling in your legs. °· Weigh yourself every day. This helps identify if your body is holding (retaining) fluid that may make your heart and lungs work harder. °· Keep all follow-up visits as told by your health care provider. This is important. °Contact a health care provider if: °· You have more redness, swelling, or pain around any incision. °· You have more fluid or blood coming from any incision. °· Any incision feels warm to the touch. °· You have pus or a bad smell coming from any incision °· You have a fever. °· You have swelling in your ankles or legs. °· You have pain in your legs. °· You gain 2 lb (0.9 kg) or more a day. °· You are nauseous or you vomit. °· You have diarrhea. °Get help right away if: °· You have chest pain that spreads to your jaw or arms. °· You are short of breath. °· You have a fast or irregular heartbeat. °· You notice a "clicking" in your breastbone (sternum) when you move. °· You have numbness or weakness in your arms or legs. °· You feel dizzy or light-headed. °Summary °· After the procedure, it is common to have pain or discomfort in the incision areas. °· Do not take baths, swim, or use a hot tub until your health care provider approves. °· Gradually increase your activities. You may need physical therapy or cardiac rehabilitation to help strengthen your muscles and build your endurance. °· Weigh yourself every day. This helps identify if your body is holding (retaining) fluid that may make your heart and lungs work  harder. °This information is not intended to replace advice given to you by your health care provider. Make sure you discuss any questions you have with your health care provider. °Document Released: 11/09/2004 Document Revised: 03/11/2016 Document Reviewed: 03/11/2016 °Elsevier Interactive Patient Education © 2017 [employer] ° °

## 2016-07-18 NOTE — Progress Notes (Signed)
Progress Note  Patient Name: Edward Farmer Date of Encounter: 07/18/2016  Primary Cardiologist: Dr. [name]  Subjective   Postop day #1 off-pump LIMA to LAD. Off all drips. Mild chest pain.  Inpatient Medications    Scheduled Meds: . acetaminophen  1,000 mg Oral Q6H   Or  . acetaminophen (TYLENOL) oral liquid 160 mg/5 mL  1,000 mg Per Tube Q6H  . aspirin EC  325 mg Oral Daily   Or  . aspirin  324 mg Per Tube Daily  . bisacodyl  10 mg Oral Daily   Or  . bisacodyl  10 mg Rectal Daily  . cefUROXime (ZINACEF)  IV  1.5 g Intravenous Q12H  . docusate sodium  200 mg Oral Daily  . enoxaparin (LOVENOX) injection  40 mg Subcutaneous QHS  . famotidine (PEPCID) IV  20 mg Intravenous Q12H  . lisinopril  20 mg Oral Daily   And  . hydrochlorothiazide  25 mg Oral Daily  . insulin aspart  0-24 Units Subcutaneous Q4H  . levalbuterol  0.63 mg Nebulization Q6H  . mouth rinse  15 mL Mouth Rinse BID  . metoprolol tartrate  12.5 mg Oral BID   Or  . metoprolol tartrate  12.5 mg Per Tube BID  . [START ON 07/19/2016] pantoprazole  40 mg Oral Daily  . simvastatin  20 mg Oral q1800  . sodium chloride flush  3 mL Intravenous Q12H   Continuous Infusions: . sodium chloride Stopped (07/18/16 0900)  . sodium chloride    . sodium chloride 10 mL/hr at 07/18/16 0900  . lactated ringers 10 mL/hr at 07/18/16 0900  . lactated ringers    . nitroGLYCERIN Stopped (07/18/16 0900)  . phenylephrine (NEO-SYNEPHRINE) Adult infusion Stopped (07/17/16 1300)   PRN Meds: sodium chloride, Place/Maintain arterial line **AND** sodium chloride, albumin human, ketorolac, lactated ringers, metoprolol, morphine injection, ondansetron (ZOFRAN) IV, oxyCODONE, sodium chloride flush, traMADol   Vital Signs    Vitals:   07/18/16 0845 07/18/16 0900 07/18/16 0915 07/18/16 0930  BP: 115/66 105/68 (!) 82/69 106/63  Pulse: 85 88 89 84  Resp: 18 (!) 27 19 17   Temp:      TempSrc:      SpO2: 91% 93% 93% 92%  Weight:       Height:        Intake/Output Summary (Last 24 hours) at 07/18/16 0955 Last data filed at 07/18/16 0900  Gross per 24 hour  Intake          4497.94 ml  Output             1990 ml  Net          2507.94 ml   Filed Weights   07/15/16 2322 07/17/16 0500 07/18/16 0530  Weight: 210 lb 1.6 oz (95.3 kg) 209 lb 3.5 oz (94.9 kg) 214 lb 11.7 oz (97.4 kg)    Telemetry    Normal sinus rhythm - Personally Reviewed  ECG    Normal sinus rhythm at 90 with lateral T-wave inversion - Personally Reviewed  Physical Exam   GEN: No acute distress.   Neck: No JVD Cardiac: RRR, no murmurs, rubs, or gallops.  Respiratory: Clear to auscultation bilaterally. GI: Soft, nontender, non-distended  MS: No edema; No deformity. Neuro:  Nonfocal  Psych: Normal affect   Labs    Chemistry Recent Labs Lab 07/15/16 0225 07/16/16 0222 07/17/16 0434  07/17/16 1127 07/17/16 1302 07/17/16 1840 07/17/16 1846 07/18/16 0413  NA 138 139 138  < >  142 142  --  141 140  K 3.2* 3.3* 3.7  < > 4.0 3.7  --  4.6 4.1  CL 106 107 102  < > 108  --   --  112* 111  CO2 23 23 23   --   --   --   --   --  22  GLUCOSE 96 99 104*  < > 110* 101*  --  111* 114*  BUN 14 12 8   < > 10  --   --  10 9  CREATININE 1.11 1.04 1.17  < > 0.80  --  0.96 0.90 1.09  CALCIUM 8.2* 8.6* 8.8*  --   --   --   --   --  8.0*  PROT 6.5  --   --   --   --   --   --   --   --   ALBUMIN 3.2*  --   --   --   --   --   --   --   --   AST 24  --   --   --   --   --   --   --   --   ALT 22  --   --   --   --   --   --   --   --   ALKPHOS 62  --   --   --   --   --   --   --   --   BILITOT 0.7  --   --   --   --   --   --   --   --   GFRNONAA >60 >60 >60  --   --   --  >60  --  >60  GFRAA >60 >60 >60  --   --   --  >60  --  >60  ANIONGAP 9 9 13   --   --   --   --   --  7  < > = values in this interval not displayed.   Hematology Recent Labs Lab 07/17/16 1254  07/17/16 1840 07/17/16 1846 07/18/16 0413  WBC 9.1  --  12.8*  --  13.7*    RBC 4.11*  --  4.28  --  3.86*  HGB 11.6*  < > 12.1* 12.6* 11.0*  HCT 35.9*  < > 37.9* 37.0* 34.0*  MCV 87.3  --  88.6  --  88.1  MCH 28.2  --  28.3  --  28.5  MCHC 32.3  --  31.9  --  32.4  RDW 15.5  --  15.9*  --  15.8*  PLT 167  --  178  --  151  < > = values in this interval not displayed.  Cardiac Enzymes Recent Labs Lab 07/14/16 2045 07/14/16 2336 07/15/16 0225  TROPONINI <0.03 <0.03 0.03*    Recent Labs Lab 07/14/16 1646  TROPIPOC 0.01     BNPNo results for input(s): BNP, PROBNP in the last 168 hours.   DDimer No results for input(s): DDIMER in the last 168 hours.   Radiology    Dg Chest Port 1 View  Result Date: 07/18/2016 CLINICAL DATA:  56 year old male status post CABG. EXAM: PORTABLE CHEST 1 VIEW COMPARISON:  07/17/2016 and earlier. FINDINGS: Portable AP semi upright view at 0548 hours. Extubated. Enteric tube removed. Left chest tube remains in place. Right IJ approach Swan-Ganz catheter remains in place, and the tip  is more distal now projecting at the middle third of the right lung (arrow). Mildly lower lung volumes. Stable cardiac size and mediastinal contours. Decreased ventilation at the left lung base with obscuration of the hemidiaphragm. No pneumothorax. No pulmonary edema. Probable small pleural effusions. Interval increased gaseous distension of the stomach. IMPRESSION: 1. Extubated and enteric tube removed.  Mildly lower lung volumes. 2. Swan-Ganz catheter advanced so that the tip is now at the middle third of the right lung. 3. Stable left chest tube.  No pneumothorax. 4. Probable small pleural effusions. Increased left lung base opacity, favor atelectasis. Electronically Signed   By: Odessa Fleming M.D.   On: 07/18/2016 07:29   Dg Chest Port 1 View  Result Date: 07/17/2016 CLINICAL DATA:  CABG. EXAM: PORTABLE CHEST 1 VIEW COMPARISON:  07/14/2016. FINDINGS: Endotracheal tube tip noted approximately 3 cm above the carina. NG tube tip noted below left  hemidiaphragm. Left chest tube noted in good anatomic position. Swan-Ganz catheter with tip projected over right atrium. Prior median sternotomy. Cardiomegaly. No evidence of overt congestive heart failure. Left perihilar and left base subsegmental atelectasis. Small left pleural effusion. No pneumothorax. IMPRESSION: 1. Lines and tubes including right chest tube as above. No pneumothorax. 2. Prior median sternotomy.  Stable cardiomegaly. 3. Left perihilar left base subsegmental atelectasis. Small left pleural effusion. Electronically Signed   By: Maisie Fus  Register   On: 07/17/2016 13:24    Cardiac Studies   2-D echocardiogram  Study Conclusions  - Left ventricle: The cavity size was normal. Wall thickness was   increased in a pattern of moderate LVH. Systolic function was   normal. The estimated ejection fraction was in the range of 55%   to 60%. Wall motion was normal; there were no regional wall   motion abnormalities. Doppler parameters are consistent with   abnormal left ventricular relaxation (grade 1 diastolic   dysfunction). - Mitral valve: Valve area by pressure half-time: 1.96 cm^2.  Impressions:  - Normal LV systolic function; moderate LVH; grade 1 diastolic   dysfunction; trace TR.  Cardiac catheterization                 Left Heart   Left Ventricle The left ventricular size is in the upper limits of normal. The left ventricular systolic function is normal. LV end diastolic pressure is severely elevated. The left ventricular ejection fraction is greater than 65% by visual estimate. No regional wall motion abnormalities. There is trivial (1+) mitral regurgitation.    Aortic Valve There is no aortic valve stenosis. There is normal aortic valve motion.    Coronary Diagrams   Diagnostic Diagram        Patient Profile     Mr. Dorwart is a 56 year old African-American male admitted with unstable angina. He does have a history of uncontrolled hypertension and  ongoing tobacco abuse. He underwent Cardiac catheterization by Dr. Herbie Baltimore revealing high-grade ostial LAD disease. He is postop day #1 off-pump LIMA to the LAD. He is progressing well off all intravenous drips. He is relatively stable.  Assessment & Plan    1: Coronary artery disease-postop day 1 off-pump LIMA to LAD for high-grade ostial LAD disease. His hemogram was stable off all drips. Continue current therapy, normal progression  2: Hypertension-history of difficult to control hypertension blood pressure measured to 99/71. He is on metoprolol. Continue current medications.  3: Hyperlipidemia-on high-dose statin therapy  Signed, [name], MD  07/18/2016, 9:55 AM

## 2016-07-18 NOTE — Progress Notes (Signed)
Patient ID: Edward Farmer, male   DOB: 01-27-1961, 56 y.o.   MRN: [id number] T[facility] DAILY ICU PROGRESS NOTE                   [address]            [place], [address]          [phone]   1 Day Post-Op Procedure(s) (LRB): CORONARY ARTERY BYPASS GRAFTING (CABG) x 1. OFF PUMP. LIMA TO LAD. (N/A) TRANSESOPHAGEAL ECHOCARDIOGRAM (TEE) (N/A)  Total Length of Stay:  LOS: 3 days   Subjective: Extubated last pm, awake and alert , now on 6 liters  Objective: Vital signs in last 24 hours: Temp:  [94.8 F (34.9 C)-100.2 F (37.9 C)] 99.5 F (37.5 C) (03/15 0700) Pulse Rate:  [69-105] 90 (03/15 0700) Cardiac Rhythm: Normal sinus rhythm;Sinus tachycardia (03/15 0400) Resp:  [10-33] 19 (03/15 0700) BP: (98-153)/(55-92) 118/72 (03/15 0700) SpO2:  [87 %-99 %] 96 % (03/15 0700) Arterial Line BP: (80-301)/(60-298) 165/161 (03/15 0145) FiO2 (%):  [40 %-100 %] 100 % (03/15 0400) Weight:  [214 lb 11.7 oz (97.4 kg)] 214 lb 11.7 oz (97.4 kg) (03/15 0530)  Filed Weights   07/15/16 2322 07/17/16 0500 07/18/16 0530  Weight: 210 lb 1.6 oz (95.3 kg) 209 lb 3.5 oz (94.9 kg) 214 lb 11.7 oz (97.4 kg)    Weight change: 5 lb 8.2 oz (2.5 kg)   Hemodynamic parameters for last 24 hours: PAP: (11-50)/(0-32) 28/1 CO:  [4.1 L/min-8 L/min] 7.7 L/min CI:  [2 L/min/m2-3.8 L/min/m2] 3.6 L/min/m2  Intake/Output from previous day: 03/14 0701 - 03/15 0700 In: 4325.9 [I.V.:2910.9; IV Piggyback:1415] Out: 1880 [Urine:1420; Blood:80; Chest Tube:380]  Intake/Output this shift: No intake/output data recorded.  Current Meds: Scheduled Meds: . acetaminophen  1,000 mg Oral Q6H   Or  . acetaminophen (TYLENOL) oral liquid 160 mg/5 mL  1,000 mg Per Tube Q6H  . aspirin EC  325 mg Oral Daily   Or  . aspirin  324 mg Per Tube Daily  . bisacodyl  10 mg Oral Daily   Or  . bisacodyl  10 mg Rectal Daily  . cefUROXime (ZINACEF)  IV  1.5 g Intravenous Q12H  . docusate sodium  200 mg Oral Daily  .  famotidine (PEPCID) IV  20 mg Intravenous Q12H  . insulin aspart  0-24 Units Subcutaneous Q4H  . levalbuterol  0.63 mg Nebulization Q6H  . mouth rinse  15 mL Mouth Rinse BID  . metoprolol tartrate  12.5 mg Oral BID   Or  . metoprolol tartrate  12.5 mg Per Tube BID  . [START ON 07/19/2016] pantoprazole  40 mg Oral Daily  . simvastatin  20 mg Oral q1800  . sodium chloride flush  3 mL Intravenous Q12H   Continuous Infusions: . sodium chloride 20 mL/hr at 07/18/16 0700  . sodium chloride    . sodium chloride 10 mL/hr at 07/18/16 0700  . dexmedetomidine Stopped (07/17/16 2220)  . lactated ringers 10 mL/hr at 07/18/16 0700  . lactated ringers    . nitroGLYCERIN 70 mcg/min (07/18/16 0700)  . phenylephrine (NEO-SYNEPHRINE) Adult infusion Stopped (07/17/16 1300)   PRN Meds:.sodium chloride, Place/Maintain arterial line **AND** sodium chloride, albumin human, lactated ringers, metoprolol, midazolam, morphine injection, ondansetron (ZOFRAN) IV, oxyCODONE, sodium chloride flush, traMADol  General appearance: alert, cooperative and no distress Neurologic: intact Heart: regular rate and rhythm, S1, S2 normal, no murmur, click, rub or gallop Lungs: diminished breath sounds bibasilar  Abdomen: soft, non-tender; bowel sounds normal; no masses,  no organomegaly Extremities: extremities normal, atraumatic, no cyanosis or edema and Homans sign is negative, no sign of DVT Wound: sternum stable  Lab Results: CBC: Recent Labs  07/17/16 1840 07/17/16 1846 07/18/16 0413  WBC 12.8*  --  13.7*  HGB 12.1* 12.6* 11.0*  HCT 37.9* 37.0* 34.0*  PLT 178  --  151   BMET:  Recent Labs  07/17/16 0434  07/17/16 1846 07/18/16 0413  NA 138  < > 141 140  K 3.7  < > 4.6 4.1  CL 102  < > 112* 111  CO2 23  --   --  22  GLUCOSE 104*  < > 111* 114*  BUN 8  < > 10 9  CREATININE 1.17  < > 0.90 1.09  CALCIUM 8.8*  --   --  8.0*  < > = values in this interval not displayed.  CMET: Lab Results  Component  Value Date   WBC 13.7 (H) 07/18/2016   HGB 11.0 (L) 07/18/2016   HCT 34.0 (L) 07/18/2016   PLT 151 07/18/2016   GLUCOSE 114 (H) 07/18/2016   CHOL 174 07/14/2016   TRIG 231 (H) 07/14/2016   HDL 34 (L) 07/14/2016   LDLCALC 94 07/14/2016   ALT 22 07/15/2016   AST 24 07/15/2016   NA 140 07/18/2016   K 4.1 07/18/2016   CL 111 07/18/2016   CREATININE 1.09 07/18/2016   BUN 9 07/18/2016   CO2 22 07/18/2016   TSH 0.856 07/29/2007   INR 1.20 07/17/2016   HGBA1C 5.8 (H) 07/16/2016      PT/INR:  Recent Labs  07/17/16 1254  LABPROT 15.3*  INR 1.20   Radiology: Dg Chest Port 1 View  Result Date: 07/18/2016 CLINICAL DATA:  56 year old male status post CABG. EXAM: PORTABLE CHEST 1 VIEW COMPARISON:  07/17/2016 and earlier. FINDINGS: Portable AP semi upright view at 0548 hours. Extubated. Enteric tube removed. Left chest tube remains in place. Right IJ approach Swan-Ganz catheter remains in place, and the tip is more distal now projecting at the middle third of the right lung (arrow). Mildly lower lung volumes. Stable cardiac size and mediastinal contours. Decreased ventilation at the left lung base with obscuration of the hemidiaphragm. No pneumothorax. No pulmonary edema. Probable small pleural effusions. Interval increased gaseous distension of the stomach. IMPRESSION: 1. Extubated and enteric tube removed.  Mildly lower lung volumes. 2. Swan-Ganz catheter advanced so that the tip is now at the middle third of the right lung. 3. Stable left chest tube.  No pneumothorax. 4. Probable small pleural effusions. Increased left lung base opacity, favor atelectasis. Electronically Signed   By: Odessa Fleming M.D.   On: 07/18/2016 07:29   Dg Chest Port 1 View  Result Date: 07/17/2016 CLINICAL DATA:  CABG. EXAM: PORTABLE CHEST 1 VIEW COMPARISON:  07/14/2016. FINDINGS: Endotracheal tube tip noted approximately 3 cm above the carina. NG tube tip noted below left hemidiaphragm. Left chest tube noted in good  anatomic position. Swan-Ganz catheter with tip projected over right atrium. Prior median sternotomy. Cardiomegaly. No evidence of overt congestive heart failure. Left perihilar and left base subsegmental atelectasis. Small left pleural effusion. No pneumothorax. IMPRESSION: 1. Lines and tubes including right chest tube as above. No pneumothorax. 2. Prior median sternotomy.  Stable cardiomegaly. 3. Left perihilar left base subsegmental atelectasis. Small left pleural effusion. Electronically Signed   By: Maisie Fus  Register   On: 07/17/2016 13:24  Assessment/Plan: S/P Procedure(s) (LRB): CORONARY ARTERY BYPASS GRAFTING (CABG) x 1. OFF PUMP. LIMA TO LAD. 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

## 2016-07-18 NOTE — Progress Notes (Signed)
[name] MD paged regarding patient's post-extubation gas. Orders received for another half amp of Bicarb. Also notified MD of unsuccessful arterial line placement attempts and was notified to D/C arterial line and go by the cuff pressures. Will continue to monitor.   Edward PorchBradley Kynlie [name]

## 2016-07-18 NOTE — Discharge Summary (Addendum)
Physician Discharge Summary       [address]       [name] [id number]             [phone]    Patient ID: Edward ShyGeorge E Farmer MRN: [id number] DOB/AGE: 1960-12-08 56 y.o.  Admit date: 07/14/2016 Discharge date: 07/22/2016  Admission Diagnoses: 1. Unstable angina (HCC) 2. Coronary artery disease  Active Diagnoses:  1. Essential hypertension, benign 2. AKI (acute kidney injury) (HCC) 3. Chronic hepatitis C without hepatic coma (HCC) 4. Family history of premature CAD 5. Asthma 6. Tobacco abuse 7. ABL anemia  Procedure (s):  TRANSESOPHAGEAL ECHOCARDIOGRAM (TEE) MEDIAN STERNOTOMY for CORONARY ARTERY BYPASS GRAFTING (CABG) x 1 (LIMA to LAD) OFF PUMP by Dr. Tyrone SageGerhardt on 07/17/2016.  History of Presenting Illness: This is a 56 year old African American male with a history of poorly controlled hypertension and ongoing tobacco abuse (down to 2-3 cigarettes per day). Two mornings ago while fixing breakfast he not feeling bad and having chest pain, he was going to drive himself to the ER but felt bad enough that he called EMS. He was admitted two days ago.  He has been having exertional dyspnea/angina with exertion. He works at a [employer].   He has significant family h/o premature CAD , Father had a MI at age 57 and died of this, chronic hepatitis C and asthma [facility]14[facility]16 for evaluation of chest pain / unstable angina.  Patient seen by Dr Laneta SimmersBartle as emergency consult in  cath lab 07/16/2016, but patient wanted to discuss with his family proceeding. Potential risks, benefits, and complications of the surgery were discussed with the patient and he agreed to proceed with surgery. Pre operative carotid duplex US showed no significant internal carotid artery stenosis bilaterally. He underwent a CABG x 1 off pump on 07/17/2016.  Brief Hospital Course:  The patient was extubated the evening of surgery without difficulty. He remained  afebrile and hemodynamically stable. Theone MurdochSwan Ganz, a line, chest tubes, and foley were removed early in the post operative course. Lopressor was started and titrated accordingly. He was volume over loaded and diuresed. He had ABL anemia. He did not require a post op transfusion. Last H and H was 12.1 and 37.6. He was weaned off the insulin drip.  Once he was tolerating a diet, home diabetic medicines were restarted.  The patient's glucose remained well controlled.The patient's HGA1C pre op was 5.8. The patient was felt surgically stable for transfer from the ICU to PCTU for further convalescence on 07/19/2016. He continues to progress with cardiac rehab. He was ambulating on room air. He has been tolerating a diet and has had a bowel movement. Epicardial pacing wires were removed on 07/21/2016. Chest tube sutures will be removed the day of discharge. The patient is felt surgically stable for discharge today.   Latest Vital Signs: Blood pressure 108/83, pulse 88, temperature 98.3 F (36.8 C), temperature source Oral, resp. rate 18, height 5\' 9"  (1.753 m), weight 91.7 kg (202 lb 3.2 oz), SpO2 96 %.  Physical Exam: General appearance: alert, cooperative and no distress Neurologic: intact Heart: regular rate and rhythm, S1, S2 normal, no murmur, click, rub or gallop Lungs: diminished breath sounds bibasilar Abdomen: soft, non-tender; bowel sounds normal; no masses,  no organomegaly Extremities: extremities normal, atraumatic, no cyanosis or edema and Homans sign is negative, no sign of DVT Wound: sternum stable  Discharge Condition: Stable and discharged to home.  Recent  laboratory studies:  Lab Results  Component Value Date   WBC 18.3 (H) 07/20/2016   HGB 12.1 (L) 07/20/2016   HCT 37.6 (L) 07/20/2016   MCV 89.5 07/20/2016   PLT 170 07/20/2016   Lab Results  Component Value Date   NA 136 07/20/2016   K 4.3 07/20/2016   CL 101 07/20/2016   CO2 22 07/20/2016   CREATININE 1.23 07/20/2016    GLUCOSE 93 07/20/2016    Diagnostic Studies:  CLINICAL DATA:  Postop CABG.  EXAM: CHEST  2 VIEW  COMPARISON:  07/19/2016  FINDINGS: Interval removal of right IJ sheath. Central removal of left-sided chest tube. Lungs are adequately inflated with minimal patchy density over the left mid to lower lung slightly improved likely atelectasis. Small amount left pleural fluid improved. Mild stable cardiomegaly. Sternotomy wires unchanged. Remainder of the exam is unchanged.  IMPRESSION: Interval improvement patchy density over the left mid to lower lung likely improving atelectasis. Small amount left pleural fluid.  Stable cardiomegaly.   Electronically Signed   By: Elberta Fortis M.D.   On: 07/20/2016 08:11  Discharge Instructions    Amb Referral to Cardiac Rehabilitation    Complete by:  As directed    Diagnosis:  CABG   CABG X ___:  1     Discharge Medications: Allergies as of 07/22/2016      Reactions   No Known Allergies       Medication List    STOP taking these medications   hydrochlorothiazide 25 MG tablet Commonly known as:  HYDRODIURIL   ibuprofen 800 MG tablet Commonly known as:  ADVIL,MOTRIN     TAKE these medications   acetaminophen 325 MG tablet Commonly known as:  TYLENOL Take 2 tablets (650 mg total) by mouth every 6 (six) hours as needed for mild pain.   aspirin 325 MG EC tablet Take 1 tablet (325 mg total) by mouth daily.   lisinopril-hydrochlorothiazide 20-25 MG tablet Commonly known as:  PRINZIDE,ZESTORETIC Take 1 tablet by mouth daily.   metoprolol tartrate 25 MG tablet Commonly known as:  LOPRESSOR Take 1 tablet (25 mg total) by mouth 2 (two) times daily.   oxyCODONE 5 MG immediate release tablet Commonly known as:  Oxy IR/ROXICODONE Take 5 mg by mouth every 4-6 hours PRN severe pain   simvastatin 20 MG tablet Commonly known as:  ZOCOR Take 1 tablet (20 mg total) by mouth daily at 6 PM.      The patient has been  discharged on:   1.Beta Blocker:  Yes [ x  ]                              No   [   ]                              If No, reason:  2.Ace Inhibitor/ARB: Yes [  x ]                                     No  [    ]                                     If No, reason:  3.Statin:  Yes [ x  ]                  No  [   ]                  If No, reason:  4.Ecasa:  Yes  [  x ]                  No   [   ]                  If No, reason:  Follow Up Appointments: Follow-up Information    Delight Ovens, MD Follow up on 08/19/2016.   Specialty:  Cardiothoracic Surgery Why:  PA/LAT CXR to be taken (at [facility] which is in the same building as Dr. Dennie Maizes office) on 08/19/2016 at 12:30 pm;Appointment is with physician assistant and time is at 1:00 pm Contact information: [address] [place] [address] [phone]        Corine Shelter, PA-C Follow up on 08/02/2016.   Specialties:  Cardiology, Radiology Why:  Appointment time is at 3:30 pm Contact information: [address] [place] [address] [phone]           Signed: Doree Fudge MPA-C 07/22/2016, 7:58 AM

## 2016-07-18 NOTE — Progress Notes (Signed)
CT surgery p.m. Rounds  Doing well up in chair Walking hallway today Abdomen slightly distended but nontender with bowel sounds present P.m. labs reviewed and are satisfactory Sinus rhythm, O2 saturation 94%

## 2016-07-19 ENCOUNTER — Inpatient Hospital Stay (HOSPITAL_COMMUNITY): Payer: Self-pay

## 2016-07-19 LAB — BASIC METABOLIC PANEL
Anion gap: 7 (ref 5–15)
BUN: 16 mg/dL (ref 6–20)
CO2: 25 mmol/L (ref 22–32)
Calcium: 8.3 mg/dL — ABNORMAL LOW (ref 8.9–10.3)
Chloride: 105 mmol/L (ref 101–111)
Creatinine, Ser: 1.25 mg/dL — ABNORMAL HIGH (ref 0.61–1.24)
GFR calc Af Amer: >60 mL/min (ref >60)
GFR calc non Af Amer: >60 mL/min (ref >60)
Glucose, Bld: 98 mg/dL (ref 65–99)
Potassium: 3.7 mmol/L (ref 3.5–5.1)
Sodium: 137 mmol/L (ref 135–145)

## 2016-07-19 LAB — GLUCOSE, CAPILLARY
GLUCOSE-CAPILLARY: 106 mg/dL — AB (ref 65–99)
GLUCOSE-CAPILLARY: 122 mg/dL — AB (ref 65–99)

## 2016-07-19 LAB — CBC
HCT: 35.4 % — ABNORMAL LOW (ref 39.0–52.0)
Hemoglobin: 11.3 g/dL — ABNORMAL LOW (ref 13.0–17.0)
MCH: 28.5 pg (ref 26.0–34.0)
MCHC: 31.9 g/dL (ref 30.0–36.0)
MCV: 89.4 fL (ref 78.0–100.0)
Platelets: 146 10*3/uL — ABNORMAL LOW (ref 150–400)
RBC: 3.96 MIL/uL — ABNORMAL LOW (ref 4.22–5.81)
RDW: 16.2 % — ABNORMAL HIGH (ref 11.5–15.5)
WBC: 17.4 10*3/uL — ABNORMAL HIGH (ref 4.0–10.5)

## 2016-07-19 MED ORDER — METOPROLOL TARTRATE 12.5 MG HALF TABLET
12.5000 mg | ORAL_TABLET | Freq: Two times a day (BID) | ORAL | Status: DC
  Administered 2016-07-19 – 2016-07-21 (×6): 12.5 mg via ORAL
  Filled 2016-07-19 (×6): qty 1

## 2016-07-19 MED ORDER — FUROSEMIDE 40 MG PO TABS
40.0000 mg | ORAL_TABLET | Freq: Every day | ORAL | Status: AC
  Administered 2016-07-19 – 2016-07-20 (×2): 40 mg via ORAL
  Filled 2016-07-19 (×2): qty 1

## 2016-07-19 MED ORDER — DOCUSATE SODIUM 100 MG PO CAPS
200.0000 mg | ORAL_CAPSULE | Freq: Every day | ORAL | Status: DC
  Administered 2016-07-19 – 2016-07-21 (×3): 200 mg via ORAL
  Filled 2016-07-19 (×3): qty 2

## 2016-07-19 MED ORDER — GUAIFENESIN ER 600 MG PO TB12: 600.0000 mg | ORAL_TABLET | Freq: Two times a day (BID) | ORAL | Status: DC | PRN

## 2016-07-19 MED ORDER — PANTOPRAZOLE SODIUM 40 MG PO TBEC
40.0000 mg | DELAYED_RELEASE_TABLET | Freq: Every day | ORAL | Status: DC
  Administered 2016-07-19 – 2016-07-22 (×4): 40 mg via ORAL
  Filled 2016-07-19 (×4): qty 1

## 2016-07-19 MED ORDER — ONDANSETRON HCL 4 MG/2ML IJ SOLN: 4.0000 mg | Freq: Four times a day (QID) | INTRAMUSCULAR | Status: DC | PRN

## 2016-07-19 MED ORDER — SODIUM CHLORIDE 0.9% FLUSH: 3.0000 mL | INTRAVENOUS | Status: DC | PRN

## 2016-07-19 MED ORDER — TRAMADOL HCL 50 MG PO TABS
50.0000 mg | ORAL_TABLET | ORAL | Status: DC | PRN
  Filled 2016-07-19: qty 2

## 2016-07-19 MED ORDER — ACETYLCYSTEINE 20 % IN SOLN
2.0000 mL | Freq: Three times a day (TID) | RESPIRATORY_TRACT | Status: AC
  Administered 2016-07-19 (×2): 2 mL via RESPIRATORY_TRACT
  Filled 2016-07-19 (×4): qty 4
  Filled 2016-07-19: qty 2
  Filled 2016-07-19: qty 4

## 2016-07-19 MED ORDER — SODIUM CHLORIDE 0.9 % IV SOLN: 250.0000 mL | INTRAVENOUS | Status: DC | PRN

## 2016-07-19 MED ORDER — ASPIRIN EC 325 MG PO TBEC
325.0000 mg | DELAYED_RELEASE_TABLET | Freq: Every day | ORAL | Status: DC
  Administered 2016-07-19 – 2016-07-22 (×4): 325 mg via ORAL
  Filled 2016-07-19 (×4): qty 1

## 2016-07-19 MED ORDER — BISACODYL 10 MG RE SUPP: 10.0000 mg | Freq: Every day | RECTAL | Status: DC | PRN

## 2016-07-19 MED ORDER — ACETAMINOPHEN 325 MG PO TABS: 650.0000 mg | ORAL_TABLET | Freq: Four times a day (QID) | ORAL | Status: DC | PRN

## 2016-07-19 MED ORDER — ONDANSETRON HCL 4 MG PO TABS: 4.0000 mg | ORAL_TABLET | Freq: Four times a day (QID) | ORAL | Status: DC | PRN

## 2016-07-19 MED ORDER — OXYCODONE HCL 5 MG PO TABS
5.0000 mg | ORAL_TABLET | ORAL | Status: DC | PRN
  Administered 2016-07-19 – 2016-07-22 (×11): 10 mg via ORAL
  Filled 2016-07-19 (×11): qty 2

## 2016-07-19 MED ORDER — MOVING RIGHT ALONG BOOK
Freq: Once | Status: AC
  Administered 2016-07-19: 08:00:00
  Filled 2016-07-19: qty 1

## 2016-07-19 MED ORDER — SODIUM CHLORIDE 0.9 % IV SOLN
30.0000 meq | Freq: Once | INTRAVENOUS | Status: AC
  Administered 2016-07-19: 30 meq via INTRAVENOUS
  Filled 2016-07-19: qty 15

## 2016-07-19 MED ORDER — SODIUM CHLORIDE 0.9% FLUSH
3.0000 mL | Freq: Two times a day (BID) | INTRAVENOUS | Status: DC
  Administered 2016-07-19 – 2016-07-21 (×6): 3 mL via INTRAVENOUS

## 2016-07-19 MED ORDER — BISACODYL 5 MG PO TBEC
10.0000 mg | DELAYED_RELEASE_TABLET | Freq: Every day | ORAL | Status: DC | PRN
  Administered 2016-07-21: 10 mg via ORAL
  Filled 2016-07-19: qty 2

## 2016-07-19 MED ORDER — POTASSIUM CHLORIDE CRYS ER 20 MEQ PO TBCR
20.0000 meq | EXTENDED_RELEASE_TABLET | Freq: Every day | ORAL | Status: AC
  Administered 2016-07-19 – 2016-07-20 (×2): 20 meq via ORAL
  Filled 2016-07-19 (×2): qty 1

## 2016-07-19 MED FILL — Cefuroxime Sodium For Inj 750 MG: INTRAMUSCULAR | Qty: 750 | Status: AC

## 2016-07-19 NOTE — Progress Notes (Signed)
Patient ID: Janalyn ShyGeorge E Bean, male   DOB: Dec 19, 1960, 56 y.o.   MRN: [id number] TCTS DAILY ICU PROGRESS NOTE                   [address]            [place] [id number]          [phone]   2 Days Post-Op Procedure(s) (LRB): CORONARY ARTERY BYPASS GRAFTING (CABG) x 1. OFF PUMP. LIMA TO LAD. (N/A) TRANSESOPHAGEAL ECHOCARDIOGRAM (TEE) (N/A)  Total Length of Stay:  LOS: 4 days   Subjective: Alert and awake, neuro intact, on 5 l o2, no complaint of sob  Objective: Vital signs in last 24 hours: Temp:  [97.7 F (36.5 C)-99.1 F (37.3 C)] 98.1 F (36.7 C) (03/16 0400) Pulse Rate:  [62-100] 86 (03/16 0700) Cardiac Rhythm: Normal sinus rhythm (03/16 0400) Resp:  [12-29] 18 (03/16 0700) BP: (82-151)/(61-99) 150/93 (03/16 0700) SpO2:  [90 %-97 %] 94 % (03/16 0700) FiO2 (%):  [40 %] 40 % (03/15 2149) Weight:  [213 lb 6.5 oz (96.8 kg)] 213 lb 6.5 oz (96.8 kg) (03/16 0145)  Filed Weights   07/17/16 0500 07/18/16 0530 07/19/16 0145  Weight: 209 lb 3.5 oz (94.9 kg) 214 lb 11.7 oz (97.4 kg) 213 lb 6.5 oz (96.8 kg)    Weight change: -1 lb 5.2 oz (-0.6 kg)   Hemodynamic parameters for last 24 hours: PAP: (26)/(8) 26/8  Intake/Output from previous day: 03/15 0701 - 03/16 0700 In: 1187 [P.O.:480; I.V.:342; IV Piggyback:365] Out: 1875 [Urine:1565; Chest Tube:310]  Intake/Output this shift: No intake/output data recorded.  Current Meds: Scheduled Meds: . acetaminophen  1,000 mg Oral Q6H   Or  . acetaminophen (TYLENOL) oral liquid 160 mg/5 mL  1,000 mg Per Tube Q6H  . aspirin EC  325 mg Oral Daily   Or  . aspirin  324 mg Per Tube Daily  . bisacodyl  10 mg Oral Daily   Or  . bisacodyl  10 mg Rectal Daily  . cefUROXime (ZINACEF)  IV  1.5 g Intravenous Q12H  . docusate sodium  200 mg Oral Daily  . enoxaparin (LOVENOX) injection  40 mg Subcutaneous QHS  . lisinopril  20 mg Oral Daily   And  . hydrochlorothiazide  25 mg Oral Daily  . insulin aspart  0-24 Units  Subcutaneous Q4H  . levalbuterol  0.63 mg Nebulization Q6H  . mouth rinse  15 mL Mouth Rinse BID  . metoprolol tartrate  12.5 mg Oral BID   Or  . metoprolol tartrate  12.5 mg Per Tube BID  . pantoprazole  40 mg Oral Daily  . potassium chloride (KCL MULTIRUN) 30 mEq in 265 mL IVPB  30 mEq Intravenous Once  . simvastatin  20 mg Oral q1800  . sodium chloride flush  3 mL Intravenous Q12H   Continuous Infusions: . sodium chloride Stopped (07/18/16 0900)  . sodium chloride    . sodium chloride 10 mL/hr at 07/18/16 0900  . lactated ringers 10 mL/hr at 07/19/16 0700  . lactated ringers    . nitroGLYCERIN Stopped (07/18/16 0900)  . phenylephrine (NEO-SYNEPHRINE) Adult infusion Stopped (07/17/16 1300)   PRN Meds:.sodium chloride, Place/Maintain arterial line **AND** sodium chloride, ketorolac, lactated ringers, metoprolol, morphine injection, ondansetron (ZOFRAN) IV, oxyCODONE, sodium chloride flush, traMADol  General appearance: alert and cooperative Neurologic: intact Heart: regular rate and rhythm, S1, S2 normal, no murmur, click, rub or gallop Lungs: diminished breath sounds bibasilar Abdomen:  soft, non-tender; bowel sounds normal; no masses,  no organomegaly Extremities: extremities normal, atraumatic, no cyanosis or edema and Homans sign is negative, no sign of DVT Wound: sternum stable  Lab Results: CBC: Recent Labs  07/18/16 1730 07/18/16 1731 07/19/16 0418  WBC 20.3*  --  17.4*  HGB 12.2* 10.9* 11.3*  HCT 37.9* 32.0* 35.4*  PLT 165  --  146*   BMET:  Recent Labs  07/18/16 0413  07/18/16 1731 07/19/16 0418  NA 140  --  137 137  K 4.1  --  3.9 3.7  CL 111  --  103 105  CO2 22  --   --  25  GLUCOSE 114*  --  98 98  BUN 9  --  11 16  CREATININE 1.09  < > 1.00 1.25*  CALCIUM 8.0*  --   --  8.3*  < > = values in this interval not displayed.  CMET: Lab Results  Component Value Date   WBC 17.4 (H) 07/19/2016   HGB 11.3 (L) 07/19/2016   HCT 35.4 (L) 07/19/2016    PLT 146 (L) 07/19/2016   GLUCOSE 98 07/19/2016   CHOL 174 07/14/2016   TRIG 231 (H) 07/14/2016   HDL 34 (L) 07/14/2016   LDLCALC 94 07/14/2016   ALT 22 07/15/2016   AST 24 07/15/2016   NA 137 07/19/2016   K 3.7 07/19/2016   CL 105 07/19/2016   CREATININE 1.25 (H) 07/19/2016   BUN 16 07/19/2016   CO2 25 07/19/2016   TSH 0.856 07/29/2007   INR 1.20 07/17/2016   HGBA1C 5.8 (H) 07/16/2016      PT/INR:  Recent Labs  07/17/16 1254  LABPROT 15.3*  INR 1.20   Radiology: Dg Chest Port 1 View  Result Date: 07/18/2016 CLINICAL DATA:  56 year old male status post CABG. EXAM: PORTABLE CHEST 1 VIEW COMPARISON:  07/17/2016 and earlier. FINDINGS: Portable AP semi upright view at 0548 hours. Extubated. Enteric tube removed. Left chest tube remains in place. Right IJ approach Swan-Ganz catheter remains in place, and the tip is more distal now projecting at the middle third of the right lung (arrow). Mildly lower lung volumes. Stable cardiac size and mediastinal contours. Decreased ventilation at the left lung base with obscuration of the hemidiaphragm. No pneumothorax. No pulmonary edema. Probable small pleural effusions. Interval increased gaseous distension of the stomach. IMPRESSION: 1. Extubated and enteric tube removed.  Mildly lower lung volumes. 2. Swan-Ganz catheter advanced so that the tip is now at the middle third of the right lung. 3. Stable left chest tube.  No pneumothorax. 4. Probable small pleural effusions. Increased left lung base opacity, favor atelectasis. Electronically Signed   By: Odessa Fleming M.D.   On: 07/18/2016 07:29    Assessment/Plan: S/P Procedure(s) (LRB): CORONARY ARTERY BYPASS GRAFTING (CABG) x 1. OFF PUMP. LIMA TO LAD. (N/A) TRANSESOPHAGEAL ECHOCARDIOGRAM (TEE) (N/A) Mobilize Diuresis d/c tubes/lines Plan for transfer to step-down: see transfer orders pulmonary toilet     Delight Ovens 07/19/2016 7:21 AM

## 2016-07-19 NOTE — Progress Notes (Signed)
Pt ambulated 150 ft in hall way with walker on 3 L of oxygen. Tolerated fairly well. Only stopped once for short rest. Pt now resting in chair with call bell in reach. Will continue to monitor pt.  Harriet Massonavidson, Hedda Crumbley E, RN

## 2016-07-19 NOTE — Progress Notes (Signed)
CARDIAC REHAB PHASE I   PRE:  Rate/Rhythm: 100 SR  BP:  Supine: 170/87  Sitting:   Standing:    SaO2: 95% 5L  MODE:  Ambulation: 150 ft   POST:  Rate/Rhythm: 114  BP:  Supine: 162/85  Sitting:   Standing:    SaO2: 94% 4L 1440-1515 Pt walked 150 ft on 4L with rolling walker and asst x1 . Very SOB, almost hyperventilating. Tried to get pt to purse lip breathe. c/o pain and RN medicated him. Left on 4L. Pt stated he is in much pain. Encouraged him to ask for pain med when needed so he will be able to walk and use IS. To bed.   Luetta Nuttingharlene Gurshan Settlemire, RN BSN  07/19/2016 3:10 PM

## 2016-07-19 NOTE — Progress Notes (Signed)
Pt transferred to 2w from 2s. Telemetry box applied and CCMD notified. Vitals obtained. Pt oriented to room. Pt denies needs at this time. Will continue current plan of care.  Berdine DanceLauren Moffitt BSN, RN

## 2016-07-19 NOTE — Op Note (Signed)
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  POSTOPERATIVE DIAGNOSIS:  Unstable angina with high-grade LAD (left anterior descending) disease.  SURGICAL PROCEDURE:  Urgent coronary artery bypass grafting x1 with the left internal mammary to the left anterior descending coronary artery off pump.  SURGEON:  Sheliah PlaneEdward Dustin Burrill, MD.  FIRST ASSISTANT:  Doree Fudgeonielle Zimmerman, PA.  BRIEF HISTORY:  The patient is a 56 year old male with a long smoking history who presented 3 days prior to surgery to the emergency room when he had onset of substernal chest pain while fixing breakfast.  Initially he thought he would drive himself to the emergency room; however, because of increasing discomfort, he called EMS and was brought urgently.  The patient was admitted and seen by Cardiology.  On the day prior to surgery, the patient underwent cardiac catheterization by Dr. Dorethea ClanHardin.  At that time, emergency consultation to the cath lab was requested.  Dr. Laneta SimmersBartle saw the patient, and reviewing the films, recommended that the patient proceeding with coronary artery bypass grafting x1 to the LAD.  The patient had a very proximal LAD lesion of 95%, some slight narrowing of the distal left main, but a large vessel without disease extending to the circumflex.  However, the patient did not wish to be immediately operated on.  He wished to notify his family. I saw the patient later in the day and again suggested to him that he needed to proceed with coronary artery bypass grafting.  At this point, it was stable without chest pain and we were agreeable to  proceeding with surgery the following day, and signed informed consent.  DESCRIPTION OF PROCEDURE:  With Swan-Ganz and arterial line monitors in place, the patient underwent general endotracheal anesthesia without incident.  Dr. Krista BlueSinger placed a TEE probe, and the skin of the chest and legs was prepped with Betadine and draped in usual sterile manner. Appropriate time-out was performed and we proceeded with standard sternotomy.  The left internal mammary artery was dissected down as a pedicle graft.  The distal artery was divided and had a good free flow. The vessel was hydrostatically dilated with heparinized saline.  The pericardium was opened.  The heart was tilted up slightly, which gave a good visualization of the LAD, which was a good size vessel and was easily approachable.  We decided to proceed with off pump coronary artery bypass grafting.  Appropriate retractor was then placed.  The patient was systemically heparinized.  Suction cup device was placed on the apex of the heart tilting the heart up slightly, Elasto bands were placed around the mid LAD.  During this, the patient remained hemodynamically stable.  Pacing wires had been applied.  The proximal Elasto band was tightened slightly and the LAD  was opened with __________.  The operative field was easily visualized and using a running 8-0 Prolene, the left internal mammary artery was anastomosed to the left anterior descending coronary artery without difficulty.  Prior to closure, the mammary was flushed and anastomosis completed.  The blood flow was returned to the LAD.  There was good Doppler signal within the mammary.  The last tapes were removed.  The fascia of the mammary artery was tacked to the epicardium.  The heart was allowed to return to its normal position and the suction cup tractors were removed. The patient was hemodynamically stable.  The EKG appeared normal.  The protamine was reversed.  Pericardium was  loosely reapproximated.  Left pleural tube and a Blake mediastinal drain were left in place.  The sternum was closed with #6 stainless steel wire.  Fascia was closed with interrupted 0 Vicryl, running 3-0 Vicryl in subcutaneous tissue, 4-0 subcuticular stitch in skin edges.  Dry dressings were applied.  Sponge and needle count was reported as correct at the completion of the procedure.  The patient tolerated the procedure without obvious complication, was transferred to the Surgical Intensive care Unit for further postoperative care.  He did not require any blood bank blood products during the procedure.     Sheliah Plane, MD     EG/MEDQ  D:  07/19/2016  T:  07/19/2016  Job:  [id number]

## 2016-07-20 ENCOUNTER — Inpatient Hospital Stay (HOSPITAL_COMMUNITY): Payer: Self-pay

## 2016-07-20 LAB — CBC
HCT: 37.6 % — ABNORMAL LOW (ref 39.0–52.0)
Hemoglobin: 12.1 g/dL — ABNORMAL LOW (ref 13.0–17.0)
MCH: 28.8 pg (ref 26.0–34.0)
MCHC: 32.2 g/dL (ref 30.0–36.0)
MCV: 89.5 fL (ref 78.0–100.0)
Platelets: 170 10*3/uL (ref 150–400)
RBC: 4.2 MIL/uL — ABNORMAL LOW (ref 4.22–5.81)
RDW: 16.3 % — ABNORMAL HIGH (ref 11.5–15.5)
WBC: 18.3 10*3/uL — ABNORMAL HIGH (ref 4.0–10.5)

## 2016-07-20 LAB — BASIC METABOLIC PANEL
Anion gap: 13 (ref 5–15)
BUN: 16 mg/dL (ref 6–20)
CO2: 22 mmol/L (ref 22–32)
Calcium: 8.9 mg/dL (ref 8.9–10.3)
Chloride: 101 mmol/L (ref 101–111)
Creatinine, Ser: 1.23 mg/dL (ref 0.61–1.24)
GFR calc Af Amer: >60 mL/min (ref >60)
GFR calc non Af Amer: >60 mL/min (ref >60)
Glucose, Bld: 93 mg/dL (ref 65–99)
Potassium: 4.3 mmol/L (ref 3.5–5.1)
Sodium: 136 mmol/L (ref 135–145)

## 2016-07-20 MED ORDER — ZOLPIDEM TARTRATE 5 MG PO TABS: 5.0000 mg | ORAL_TABLET | Freq: Every evening | ORAL | Status: DC | PRN

## 2016-07-20 MED ORDER — LEVALBUTEROL HCL 0.63 MG/3ML IN NEBU
0.6300 mg | INHALATION_SOLUTION | Freq: Three times a day (TID) | RESPIRATORY_TRACT | Status: DC
  Administered 2016-07-21 – 2016-07-22 (×4): 0.63 mg via RESPIRATORY_TRACT
  Filled 2016-07-20 (×4): qty 3

## 2016-07-20 NOTE — Progress Notes (Signed)
07/20/2016 12:19 PM EPW D/C'd per order and per protocol.  Ends intact. Pt. Tolerated well.  Advised bedrest x1hr.  Call bell in reach.  Vital signs collected per protocol. Kathryne HitchAllen, Develle Sievers C

## 2016-07-20 NOTE — Progress Notes (Signed)
CARDIAC REHAB PHASE I   PRE:  Rate/Rhythm: 91 SR  BP:  Supine: 142/88 Sitting:   Standing:    SaO2: 98% 3L O2  MODE:  Ambulation: 350 ft   POST:  Rate/Rhythm: 100  BP:  Supine:   Sitting: 136/72  Standing:    SaO2: 98% 3L O2  1328-1418 Patient tolerated ambulation well with assist x1 and pushing rolling walker, gait steady, no c/o, VSS. To chair after walk with legs elevated, call bell within reach. OHS education complete including restrictions, sternal precautions, IS use, risk factor modification, tobacco cessation, and activity progression. Pt verbalizes understanding of instructions given. Discussed Phase 2 cardiac rehab, and pt is interested, referral sent [facility] cardiac [facility] at [facility].  Cristy Hiltslinty Elycia Woodside, MS, ACSM CEP

## 2016-07-20 NOTE — Progress Notes (Addendum)
      [address]       [place] [id number]             [phone]      3 Days Post-Op Procedure(s) (LRB): CORONARY ARTERY BYPASS GRAFTING (CABG) x 1. OFF PUMP. LIMA TO LAD. (N/A) TRANSESOPHAGEAL ECHOCARDIOGRAM (TEE) (N/A) Subjective: Feels okay this morning. Slept better than when he was in the unit.   Objective: Vital signs in last 24 hours: Temp:  [98.2 F (36.8 C)-99.9 F (37.7 C)] 98.6 F (37 C) (03/17 0536) Pulse Rate:  [83-112] 98 (03/17 0536) Cardiac Rhythm: Sinus tachycardia (03/16 1900) Resp:  [17-18] 17 (03/17 0536) BP: (100-137)/(66-86) 110/86 (03/17 0536) SpO2:  [92 %-99 %] 93 % (03/17 0849) Weight:  [94.9 kg (209 lb 3.2 oz)] 94.9 kg (209 lb 3.2 oz) (03/17 0605)     Intake/Output from previous day: 03/16 0701 - 03/17 0700 In: 451 [P.O.:451] Out: 1215 [Urine:1175; Chest Tube:40] Intake/Output this shift: No intake/output data recorded.  General appearance: alert, cooperative and no distress Heart: regular rate and rhythm, S1, S2 normal, no murmur, click, rub or gallop Lungs: clear to auscultation bilaterally Abdomen: soft, non-tender; bowel sounds normal; no masses,  no organomegaly Extremities: extremities normal, atraumatic, no cyanosis or edema Wound: clean and dry with a small amount of bloody drainage  Lab Results:  Recent Labs  07/19/16 0418 07/20/16 0447  WBC 17.4* 18.3*  HGB 11.3* 12.1*  HCT 35.4* 37.6*  PLT 146* 170   BMET:  Recent Labs  07/19/16 0418 07/20/16 0447  NA 137 136  K 3.7 4.3  CL 105 101  CO2 25 22  GLUCOSE 98 93  BUN 16 16  CREATININE 1.25* 1.23  CALCIUM 8.3* 8.9    PT/INR:  Recent Labs  07/17/16 1254  LABPROT 15.3*  INR 1.20   ABG    Component Value Date/Time   PHART 7.356 07/18/2016 0431   HCO3 19.9 (L) 07/18/2016 0431   TCO2 18 07/18/2016 1731   ACIDBASEDEF 5.0 (H) 07/18/2016 0431   O2SAT 92.0 07/18/2016 0431   CBG (last 3)   Recent Labs  07/18/16 2328 07/19/16 0344  07/19/16 0734  GLUCAP 127* 106* 122*    Assessment/Plan: S/P Procedure(s) (LRB): CORONARY ARTERY BYPASS GRAFTING (CABG) x 1. OFF PUMP. LIMA TO LAD. (N/A) TRANSESOPHAGEAL ECHOCARDIOGRAM (TEE) (N/A)  1. CV-NSR in the 90s, BP well controlled. On Lisinopril + HCTZ 20mg /25mg , Metoprolol 12.5mg  BID. Remove EPW.  2. Pulm-On 3L [place]. Continue to wean as tolerated. Encourage incentive spirometry and pulmonary toilet.  3. Renal- weight contines to trend down. Good urine output. Continue Lasix 40mg  daily. Creatinine stable at 1.23. Electrolytes okay.  4. H and H stable, platelets trending up. 5. Endo- blood glucose level well controlled  Plan: Discontinue epicardial pacing wires. Ambulate TID. Pain well controlled on current regimen however work on appetite. Work on incentive spirometry to wean oxygen requirements.      LOS: 5 days    Sharlene Doryessa N Conte 07/20/2016 Patient seen and examined, agree with above Making good progress  Viviann SpareSteven C. Dorris FetchHendrickson, MD [facility] [phone]

## 2016-07-21 NOTE — Progress Notes (Addendum)
      [address]       [place] [id number]             [phone]      4 Days Post-Op Procedure(s) (LRB): CORONARY ARTERY BYPASS GRAFTING (CABG) x 1. OFF PUMP. LIMA TO LAD. (N/A) TRANSESOPHAGEAL ECHOCARDIOGRAM (TEE) (N/A) Subjective: Sleepy this morning, states that he occasionally needs the oxygen when he walks.  Objective: Vital signs in last 24 hours: Temp:  [98.6 F (37 C)-99.7 F (37.6 C)] 99.7 F (37.6 C) (03/18 0550) Pulse Rate:  [85-97] 90 (03/18 0550) Cardiac Rhythm: Normal sinus rhythm (03/18 0701) Resp:  [18] 18 (03/18 0550) BP: (122-142)/(68-89) 135/79 (03/18 0550) SpO2:  [93 %-99 %] 93 % (03/18 0550) Weight:  [93.1 kg (205 lb 3.2 oz)] 93.1 kg (205 lb 3.2 oz) (03/18 0550)     Intake/Output from previous day: 03/17 0701 - 03/18 0700 In: 143 [P.O.:120; I.V.:23] Out: -  Intake/Output this shift: No intake/output data recorded.  General appearance: cooperative and no distress Heart: regular rate and rhythm, S1, S2 normal, no murmur, click, rub or gallop Lungs: clear to auscultation bilaterally Abdomen: soft, non-tender; bowel sounds normal; no masses,  no organomegaly Extremities: extremities normal, atraumatic, no cyanosis or edema Wound: clean and dry without drainage  Lab Results:  Recent Labs  07/19/16 0418 07/20/16 0447  WBC 17.4* 18.3*  HGB 11.3* 12.1*  HCT 35.4* 37.6*  PLT 146* 170   BMET:  Recent Labs  07/19/16 0418 07/20/16 0447  NA 137 136  K 3.7 4.3  CL 105 101  CO2 25 22  GLUCOSE 98 93  BUN 16 16  CREATININE 1.25* 1.23  CALCIUM 8.3* 8.9    PT/INR: No results for input(s): LABPROT, INR in the last 72 hours. ABG    Component Value Date/Time   PHART 7.356 07/18/2016 0431   HCO3 19.9 (L) 07/18/2016 0431   TCO2 18 07/18/2016 1731   ACIDBASEDEF 5.0 (H) 07/18/2016 0431   O2SAT 92.0 07/18/2016 0431   CBG (last 3)   Recent Labs  07/18/16 2328 07/19/16 0344 07/19/16 0734  GLUCAP 127* 106* 122*     Assessment/Plan: S/P Procedure(s) (LRB): CORONARY ARTERY BYPASS GRAFTING (CABG) x 1. OFF PUMP. LIMA TO LAD. (N/A) TRANSESOPHAGEAL ECHOCARDIOGRAM (TEE) (N/A)  1. CV-NSR in the 90s, BP well controlled. On Lisinopril + HCTZ 20mg /25mg , Metoprolol 12.5mg  BID.  2. Pulm-Tolerating room air when at rest but requires oxygen when ambulating. Encourage incentive spirometry and pulmonary toilet.  3. Renal- weight contines to trend down. Good urine output. Continue Lasix 40mg  daily. Creatinine stable at 1.23. Electrolytes okay.  4. H and H stable, platelets trending up. 5. Endo- blood glucose level well controlled  Plan: Ambulate TID. Pain well controlled on current regimen however work on appetite. Work on Oncologistincentive spirometry.  Patient is likely able to go home tomorrow if he can find someone to stay with. Case management consulted.     LOS: 6 days    Sharlene Doryessa N Conte 07/21/2016 Patient seen and examined, agree with above He is married but says his wife works. He is working on arrangements Probably home in AM  Fort HallSteven C. Farmer FetchHendrickson, MD [facility] [phone]

## 2016-07-22 MED ORDER — METOPROLOL TARTRATE 25 MG PO TABS
25.0000 mg | ORAL_TABLET | Freq: Two times a day (BID) | ORAL | Status: DC
  Administered 2016-07-22: 25 mg via ORAL
  Filled 2016-07-22: qty 1

## 2016-07-22 MED ORDER — ASPIRIN 325 MG PO TBEC: 325.0000 mg | DELAYED_RELEASE_TABLET | Freq: Every day | ORAL | 0 refills | Status: DC

## 2016-07-22 MED ORDER — OXYCODONE HCL 5 MG PO TABS: ORAL_TABLET | ORAL | 0 refills | Status: DC

## 2016-07-22 MED ORDER — METOPROLOL TARTRATE 25 MG PO TABS: 25.0000 mg | ORAL_TABLET | Freq: Two times a day (BID) | ORAL | 1 refills | Status: DC

## 2016-07-22 MED ORDER — SIMVASTATIN 20 MG PO TABS: 20.0000 mg | ORAL_TABLET | Freq: Every day | ORAL | 1 refills | Status: DC

## 2016-07-22 MED ORDER — ACETAMINOPHEN 325 MG PO TABS: 650.0000 mg | ORAL_TABLET | Freq: Four times a day (QID) | ORAL | Status: DC | PRN

## 2016-07-22 MED ORDER — METOPROLOL TARTRATE 25 MG PO TABS: 12.5000 mg | ORAL_TABLET | Freq: Two times a day (BID) | ORAL | 1 refills | Status: DC

## 2016-07-22 MED FILL — Heparin Sodium (Porcine) Inj 1000 Unit/ML: INTRAMUSCULAR | Qty: 30 | Status: AC

## 2016-07-22 MED FILL — Potassium Chloride Inj 2 mEq/ML: INTRAVENOUS | Qty: 40 | Status: AC

## 2016-07-22 MED FILL — Magnesium Sulfate Inj 50%: INTRAMUSCULAR | Qty: 10 | Status: AC

## 2016-07-22 NOTE — Progress Notes (Addendum)
      [address]       [place] [id number]             [phone]        5 Days Post-Op Procedure(s) (LRB): CORONARY ARTERY BYPASS GRAFTING (CABG) x 1. OFF PUMP. LIMA TO LAD. (N/A) TRANSESOPHAGEAL ECHOCARDIOGRAM (TEE) (N/A)  Subjective: Patient with not much appetite, but otherwise, no complaints. He wants to go home.  Objective: Vital signs in last 24 hours: Temp:  [98.2 F (36.8 C)-98.3 F (36.8 C)] 98.3 F (36.8 C) (03/19 0536) Pulse Rate:  [87-99] 88 (03/19 0536) Cardiac Rhythm: Normal sinus rhythm (03/19 0536) Resp:  [16-18] 18 (03/19 0536) BP: (98-126)/(62-83) 108/83 (03/19 0536) SpO2:  [96 %-97 %] 96 % (03/18 2035) Weight:  [91.7 kg (202 lb 3.2 oz)] 91.7 kg (202 lb 3.2 oz) (03/19 0536)  Pre op weight 95 kg Current Weight  07/22/16 91.7 kg (202 lb 3.2 oz)      Intake/Output from previous day: 03/18 0701 - 03/19 0700 In: 363 [P.O.:360; I.V.:3] Out: -    Physical Exam:  Cardiovascular: RRR Pulmonary: Clear to auscultation bilaterally Abdomen: Soft, non tender, bowel sounds present. Extremities: No lower extremity edema. Wounds: Clean and dry.  No erythema or signs of infection.  Lab Results: CBC: Recent Labs  07/20/16 0447  WBC 18.3*  HGB 12.1*  HCT 37.6*  PLT 170   BMET:  Recent Labs  07/20/16 0447  NA 136  K 4.3  CL 101  CO2 22  GLUCOSE 93  BUN 16  CREATININE 1.23  CALCIUM 8.9    PT/INR:  Lab Results  Component Value Date   INR 1.20 07/17/2016   INR 1.01 07/16/2016   INR 1.00 12/28/2013   ABG:  INR: Will add last result for INR, ABG once components are confirmed Will add last 4 CBG results once components are confirmed  Assessment/Plan:  1. CV - SR in the 90's. On Lopressor 12.5 mg bid, HCTZ 25 mg daily, and Lisinopril 20 mg daily. 2.  Pulmonary - On room air. Encourage incentive spirometer 3.  Acute blood loss anemia - Last H and H 12.1 and 37.6 4. Remove chest tube sutures 5.  Discharge  ZIMMERMAN,DONIELLE MPA-C 07/22/2016,7:17 AM  Plan home today , increase beta blocker to 25 mg bid Instructions discussed with him  I have seen and examined Janalyn ShyGeorge E Handler and agree with the above assessment  and plan.  Delight OvensEdward B Copper Kirtley MD [facility] [phone] Office [phone] 07/22/2016 7:48 AM

## 2016-07-22 NOTE — Progress Notes (Signed)
CARDIAC REHAB PHASE I   PRE:  Rate/Rhythm: 106 ST    BP: sitting 112/73    SaO2: 90 RA  MODE:  Ambulation: 550 ft   POST:  Rate/Rhythm: 117 ST    BP: sitting 120/73     SaO2: 92-93 RA (was 98 RA in hall)  Pt doing well. Able to stand independently and walk without RW. Steady, slow pace. HR elevated but pt did not require O2. No c/o. Reviewed ed. Reinforced smoking cessation (pt admitted he was wanting to go home and smoke just one-discussed the dangers of this).  [phone]   Harriet MassonRandi Kristan Oda [place] CES, ACSM 07/22/2016 9:00 AM

## 2016-07-22 NOTE — Progress Notes (Signed)
CSW met patient at bedside and assisted him with a bus pass home. CSW is signing off as patient has no more needs.  Rhea Pink, MSW,  [place]

## 2016-07-22 NOTE — Progress Notes (Signed)
PT Cancellation Note  Patient Details Name: Edward Farmer MRN: [id number] DOB: 1960-06-09   Cancelled Treatment:    Reason Eval/Treat Not Completed: PT screened, no needs identified, will sign off (pt ready for D/C, pt and nursing state he is independent, walking 550' with CR and no current therapy needs. Will screen and defer at this time)   Mindy Gali B Lacresia Darwish 07/22/2016, 10:44 AM  Delaney MeigsMaija Tabor Jerardo Costabile, PT [phone]

## 2016-07-22 NOTE — Care Management Note (Signed)
Case Management Note Previous CM note initiated by Edward Farmer, Edward Clinton, RN--07/16/2016, 3:10 PM   Patient Details  Name: Edward Farmer MRN: [id number] Date of Birth: 1960/05/31  Subjective/Objective:  S/p cath , has severe LAD disease, patient lives alone in an apartment, he states he has a girlfriend, Edward Farmer [phone], but they are no longer together,so he is not sure how much asstance he could get from her.  He states he has 3 children but they do not live here in [place], Edward Farmer is his daughter ,she lives in [place].  He also [facility] and he sees Edward AbrahamsMary Ann PA there. He states he may need assistance with transportation and will need assistance with his medications at dc.  Depending on what the meds are  And when he is discharged he may be able to get them from [facility].    NCM will cont to follow for dc needs.     3/14 1252 Edward Capeeborah Taylor RN, BSN - NCM called to leave message for [facility].  Awaiting call back. NCM received call back from [facility] she states she will help Edward Farmer with what ever he needs. Her cell is [phone] and her email is [email or web address].  She states to give patient the paper scripts at dc and he will get them to her and she will get his meds for him thru their program, even if he is dc over the weekend.                  Action/Plan:   Expected Discharge Date:  07/22/16               Expected Discharge Plan:  Home/Self Care  In-House Referral:  Clinical Social Work  Discharge planning Services  CM Consult  Post Acute Care Choice:  NA Choice offered to:  NA  DME Arranged:    DME Agency:     HH Arranged:    HH Agency:     Status of Service:  Completed, signed off  If discussed at [employer] Length of Stay Meetings, dates discussed:    Discharge Disposition: home/self care  Additional Comments:  07/22/16- 1055- Edward Guia RN, CM- pt for d/c home today- states he has a ride to friends home- may  need assistance in with bus pass to get from friends home to his home- will have CSW assist for transportation needs- [facility] him with his medications.   Edward Farmer, Edward Farmer [name], RN 07/22/2016, 10:55 AM [phone]

## 2016-08-02 ENCOUNTER — Ambulatory Visit: Payer: No Typology Code available for payment source | Admitting: Cardiology

## 2016-08-05 ENCOUNTER — Other Ambulatory Visit: Payer: Self-pay | Admitting: Cardiothoracic Surgery

## 2016-08-05 ENCOUNTER — Ambulatory Visit
Admission: RE | Admit: 2016-08-05 | Discharge: 2016-08-05 | Disposition: A | Discharge status: Home / Self Care | Payer: Self-pay | Source: Ambulatory Visit | Attending: Cardiothoracic Surgery | Admitting: Cardiothoracic Surgery

## 2016-08-05 ENCOUNTER — Other Ambulatory Visit: Payer: Self-pay | Admitting: *Deleted

## 2016-08-05 ENCOUNTER — Ambulatory Visit (INDEPENDENT_AMBULATORY_CARE_PROVIDER_SITE_OTHER): Payer: Self-pay | Admitting: Cardiology

## 2016-08-05 ENCOUNTER — Encounter: Payer: Self-pay | Admitting: Physician Assistant

## 2016-08-05 ENCOUNTER — Ambulatory Visit (INDEPENDENT_AMBULATORY_CARE_PROVIDER_SITE_OTHER): Payer: Self-pay | Admitting: Physician Assistant

## 2016-08-05 ENCOUNTER — Encounter: Payer: Self-pay | Admitting: Cardiology

## 2016-08-05 VITALS — BP 157/100 | HR 97 | Resp 20 | Ht 69.0 in | Wt 211.0 lb

## 2016-08-05 VITALS — BP 166/110 | HR 96 | Ht 69.0 in | Wt 211.8 lb

## 2016-08-05 DIAGNOSIS — Z951 Presence of aortocoronary bypass graft: Secondary | ICD-10-CM

## 2016-08-05 DIAGNOSIS — R079 Chest pain, unspecified: Secondary | ICD-10-CM

## 2016-08-05 DIAGNOSIS — B182 Chronic viral hepatitis C: Secondary | ICD-10-CM

## 2016-08-05 DIAGNOSIS — F17200 Nicotine dependence, unspecified, uncomplicated: Secondary | ICD-10-CM

## 2016-08-05 DIAGNOSIS — I10 Essential (primary) hypertension: Secondary | ICD-10-CM

## 2016-08-05 MED ORDER — METOPROLOL TARTRATE 25 MG PO TABS: 50.0000 mg | ORAL_TABLET | Freq: Two times a day (BID) | ORAL | 1 refills | Status: DC

## 2016-08-05 MED ORDER — OXYCODONE HCL 5 MG PO TABS: ORAL_TABLET | ORAL | 0 refills | Status: DC

## 2016-08-05 NOTE — Assessment & Plan Note (Signed)
This sounds like exaggerated post op pain. Will check an echo to R/O post CABG pericarditis but no obvious murmur on exam

## 2016-08-05 NOTE — Assessment & Plan Note (Signed)
Treated with Harvoni Jan 2016

## 2016-08-05 NOTE — Assessment & Plan Note (Signed)
Elevated in the office but the pt is uncomfortable, complaining of chest pain

## 2016-08-05 NOTE — Progress Notes (Signed)
08/05/2016 Edward Farmer   [birth year]  [id number]  Primary Physician Jacklynn Barnacle, NP Primary Cardiologist: Dr Allyson Sabal  HPI:  56 y.o. AA male with a history of poorly  hypertension, ongoing tobacco abuse down to 2-3 cig a day, and treated hepatitis C-Jan 2016. He works in  [employer]. He presented 07/14/16 with chest pain and an abnormal EKG with new TWI. He had multiple cardiac risk factors. He has a significant family h/o premature CAD , Father had a MI at age 50 and died of this. The pt was seen by Dr Allyson Sabal and admitted. His Troponin was negative. Cath done 07/16/16 showed a 45% LM, an ulcerated 85% proximal LAD, 40% mid CFX, and a small moderately diseased RCA. Dr70 y.o. male with a history of poorly  hypertension, ongoing tobacco abuse down to 2-3 cig a day. Two mornings ago while fixing breakfast he not feeling bad and having chest pain, he was going to drive himself to ER but fetlt bad enough he called EMS and was admitted two days ago.  He has been having exertional dyspnea/angina with exertion. He works in  [employer] loss adjuster.    He has significant family h/o premature CAD , Father had a MI at age 33 and died of this, chronic hepatitis C and asthma presented to [facility] ER 07/14/16 for evaluation of chest pain / unstable angina.  Patient seen by Dr Laneta Simmers as emergency consult for possible urgent CABG. Dr Laneta Simmers was ready to take the pt to the OR but the pt declined, wanting to discuss surgery with his family. He was admitted on Heparin to the SICU. On 07/17/16 the pt went for off pump CABG x 1 with an LIMA-LAD. His post op course was unremarkable. He was discharged 07/22/16.   He is seen in the office today for follow up. He complains of "10/10 chest pain".  He says he has chest pain with deep inspiration and movement. He is asking for pain medication refills.     Current Outpatient Prescriptions  Medication Sig Dispense Refill  . acetaminophen (TYLENOL) 325 MG  tablet Take 2 tablets (650 mg total) by mouth every 6 (six) hours as needed for mild pain.    [name] aspirin EC 325 MG EC tablet Take 1 tablet (325 mg total) by mouth daily. 30 tablet 0  . lisinopril-hydrochlorothiazide (PRINZIDE,ZESTORETIC) 20-25 MG tablet Take 1 tablet by mouth daily.    . metoprolol tartrate (LOPRESSOR) 25 MG tablet Take 1 tablet (25 mg total) by mouth 2 (two) times daily. 60 tablet 1  . oxyCODONE (OXY IR/ROXICODONE) 5 MG immediate release tablet Take 5 mg by mouth every 4-6 hours PRN severe pain 30 tablet 0  . simvastatin (ZOCOR) 20 MG tablet Take 1 tablet (20 mg total) by mouth daily at 6 PM. 30 tablet 1   No current facility-administered medications for this visit.     Allergies  Allergen Reactions  . No Known Allergies     Past Medical History:  Diagnosis Date  . Asthma   . Bradycardia 07/14/2016   on BB. Rate improved with discontinuation.   . Hypertension   . Tobacco abuse     Social History   Social History  . Marital status: Legally Separated    Spouse name: N/A  . Number of children: N/A  . Years of education: N/A   Occupational History  . Not on file.   Social History Main Topics  . Smoking status: Current Every  Day Smoker    Packs/day: 0.25    Types: Cigarettes  . Smokeless tobacco: Never Used     Comment: cutting back  . Alcohol use 0.0 oz/week     Comment: occ  . Drug use: No  . Sexual activity: Not on file   Other Topics Concern  . Not on file   Social History Narrative  . No narrative on file     Family History  Problem Relation Age of Onset  . Migraines Mother   . Heart attack Father      Review of Systems: General: negative for chills, fever, night sweats or weight changes.  Cardiovascular: negative for dyspnea on exertion, edema, orthopnea, palpitations, paroxysmal nocturnal dyspnea or shortness of breath Dermatological: negative for rash Respiratory: negative for cough or wheezing Urologic: negative for  hematuria Abdominal: negative for nausea, vomiting, diarrhea, bright red blood per rectum, melena, or hematemesis Neurologic: negative for visual changes, syncope, or dizziness All other systems reviewed and are otherwise negative except as noted above.    Blood pressure (!) 166/110, pulse 96, height  (1.753 m), weight 211 lb 12.8 oz (96.1 kg), SpO2 95 %.  General appearance: alert, cooperative, moderate distress and complaining of chest pain Neck: no carotid bruit and no JVD Lungs: few rhonchi Rt base Heart: regular rate and rhythm and no rub- no sternal click on exam Extremities: extremities normal, atraumatic, no cyanosis or edema Skin: Skin color, texture, turgor normal. No rashes or lesions Neurologic: Grossly normal  EKG NSR, TWI 1, AVL, V5-V6.  ASSESSMENT AND PLAN:   Chest pain at rest This sounds like exaggerated post op pain. Will check an echo to R/O post CABG pericarditis but no obvious murmur on exam  Essential hypertension, benign Elevated in the office but the pt is uncomfortable, complaining of chest pain  Chronic hepatitis C without hepatic coma [facility] Treated with Harvoni Jan 2016  Smoker Former 2 PPD smoker- now down to 1/4 PPD post CABG   PLAN  I suggested we get an echo to r/o pericarditis ASAP. If this is negative then I suspect her has a low pain tolerance and this is post Op pain. I will ask if his post op surgical appointment can be moved up.   Corine Shelter PA-C 08/05/2016 2:52 PM

## 2016-08-05 NOTE — Patient Instructions (Addendum)
We will try to make an appointment at [facility] for Tuesday 08/06/2016 for the echocardiogram so the patient will not have to travel as far.   Please obtain these over the counter medications as discussed:   Mucinex not DM but original  Colace or Miralax (stool softener)   Please call if your pain becomes worse or if you have any questions  If you experience severe crushing chest pain or sudden shortness of breath please report to the emergency room.   Please return for your usual post-op appointment which is on    Edward Ovens, MD Follow up on 08/19/2016.   Specialty:  Cardiothoracic Surgery Why:  PA/LAT CXR to be taken (at [facility] which is in the same building as Dr. Dennie Maizes office) on 08/19/2016 at 12:30 pm;Appointment is with physician assistant and time is at 1:00 pm Contact information: [address] [place] [address] [phone]    Lopressor increased to  BID for better blood pressure control.

## 2016-08-05 NOTE — Patient Instructions (Signed)
Medication Instructions:  Continue current medications  Labwork: None Ordered  Testing/Procedures: Your physician has requested that you have an echocardiogram. Echocardiography is a painless test that uses sound waves to create images of your heart. It provides your doctor with information about the size and shape of your heart and how well your heart's chambers and valves are working. This procedure takes approximately one hour. There are no restrictions for this procedure.  A chest x-ray takes a picture of the organs and structures inside the chest, including the heart, lungs, and blood vessels. This test can show several things, including, whether the heart is enlarges; whether fluid is building up in the lungs; and whether pacemaker / defibrillator leads are still in place.   Follow-Up: Your physician recommends that you schedule a follow-up appointment in: 3 Months with Dr Allyson Sabal   Any Other Special Instructions Will Be Listed Below (If Applicable).   If you need a refill on your cardiac medications before your next appointment, please call your pharmacy.

## 2016-08-05 NOTE — Progress Notes (Signed)
Edward Farmer is a 56 y.o. male patient who on 07/17/2016 underwent a CABG x 1 off pump. He went to see his Cardiologist complaining of increased chest pain. An echocardiogram was scheduled for Wed in [place].   No diagnosis found. Past Medical History:  Diagnosis Date  . Asthma   . Bradycardia 07/14/2016   on BB. Rate improved with discontinuation.   . Hypertension   . Tobacco abuse    Past Surgical History Pertinent Negatives:  Procedure Date Noted  . CARDIAC SURGERY 07/14/2016   Scheduled Meds:  Current Outpatient Prescriptions on File Prior to Visit  Medication Sig Dispense Refill  . acetaminophen (TYLENOL) 325 MG tablet Take 2 tablets (650 mg total) by mouth every 6 (six) hours as needed for mild pain.    [name] aspirin EC 325 MG EC tablet Take 1 tablet (325 mg total) by mouth daily. 30 tablet 0  . lisinopril-hydrochlorothiazide (PRINZIDE,ZESTORETIC) 20-25 MG tablet Take 1 tablet by mouth daily.    . metoprolol tartrate (LOPRESSOR) 25 MG tablet Take 1 tablet (25 mg total) by mouth 2 (two) times daily. 60 tablet 1  . oxyCODONE (OXY IR/ROXICODONE) 5 MG immediate release tablet Take 5 mg by mouth every 4-6 hours PRN severe pain 30 tablet 0  . simvastatin (ZOCOR) 20 MG tablet Take 1 tablet (20 mg total) by mouth daily at 6 PM. 30 tablet 1   No current facility-administered medications on file prior to visit.     Allergies  Allergen Reactions  . No Known Allergies    Active Problems:   * No active hospital problems. *  Blood pressure (!) 157/100, pulse 97, resp. rate 20, SpO2 95 %.  Subjective: The patient presents today with sternal pain that radiated to his right chest. He shares that he ran out of pain medication but it was helping prior to him running out. He was seen by his cardiologist who referred him to our office for severe pain. An echocardiogram was ordered for Wed in [place] however the patient is upset that it is so far away.   Objective:  Cor-RRR, without  murmur Pulm-CTA bilaterally Abd-soft, non-tender, normal bowel sounds Ext-no edema Incision-clean and dry without drainage  CLINICAL DATA:  Status post CABG on July 17, 2016. The patient reports right anterior chest pain, nonproductive cough, and shortness of breath. History of asthma and current smoker.  EXAM: CHEST  2 VIEW  COMPARISON:  PA and lateral chest x-ray of July 20, 2016.  FINDINGS: The lungs are adequately inflated. The lung markings are coarse in the lower lung zones but have improved since the previous study. The cardiac silhouette remains enlarged. The pulmonary vascularity is not engorged. There is no significant pleural effusion. There is no pneumothorax. The sternal wires are intact. There is calcification in the wall of the aortic arch.  IMPRESSION: Further interval improvement in the appearance of the pulmonary interstitium though the interstitial markings remain mildly prominent. Stable cardiomegaly without significant pulmonary vascular congestion. No significant pleural effusion.   Electronically Signed   By: David  [place] M.D.   On: 08/05/2016 16:14  Assessment & Plan: This patient is a 56 year old male with recent CABG x 1 on 3/14 who presents with sternal chest pain and right sided pain. His pain does seem to be around his incision and become worse when I apply pressure. His right sided pain did get worse with me pressing on it which makes me think it is muscular. His sternum is stable  to palpation. His CXR from today showed probably small bilateral pleural effusions and atelectasis. His sternal wires were intact.  We are working on scheduling him an echo tomorrow at [facility] so he does not have to travel to [place]. The patient was carrying a 15-20 lb backpack and I reminded him of his weight limit of 5-10 lbs. I shared that if he exceeds his upper body weight limit he is going to have more incisional pain and could injure  is incision. He needs to  listen to our recommendations. We also discussed returning to work in 3 months due to the nature and heavy lifting involved with his job. We increased is Lopressor to 50 BID today due to his hypertension and I did refill his Oxycodone prescription and gave him 15 pills. He is to slowly wean himself off the narcotic medication as we discussed. He did not have a ride home or bus money therefore our office provided him a few dollars for transportation. He was thinking about walking home and I said that it would be unsafe and not advised especially since he lives 4.5 miles away. He is to call our office if his pain becomes worse and if he experiences crushing chest pain or sudden shortness of breath he is to report to the emergency room. He is to call with questions/concerns otherwise we will see him on 4/16 for his scheduled f/u appointment. Case discussed with Dr. Dorris Fetch.   Sharlene Dory 08/05/2016

## 2016-08-05 NOTE — Assessment & Plan Note (Signed)
Former 2 PPD smoker- now down to 1/4 PPD post CABG

## 2016-08-06 ENCOUNTER — Telehealth: Payer: Self-pay | Admitting: *Deleted

## 2016-08-06 NOTE — Telephone Encounter (Signed)
Left msg to call. Results released to mychart.

## 2016-08-06 NOTE — Telephone Encounter (Signed)
-----   Message from Abelino Derrick, [place] sent at 08/05/2016  4:21 PM EDT ----- Please let the pt know his CXR looked OK  Corine Shelter PA-C 08/05/2016 4:21 PM

## 2016-08-07 ENCOUNTER — Ambulatory Visit (HOSPITAL_BASED_OUTPATIENT_CLINIC_OR_DEPARTMENT_OTHER): Payer: Self-pay

## 2016-08-09 ENCOUNTER — Telehealth: Payer: Self-pay | Admitting: *Deleted

## 2016-08-09 NOTE — Telephone Encounter (Signed)
The patient has been called 3x and left messages to contact our office regarding his appointment for Echo on Monday, April 9.  He has not called back.

## 2016-08-12 ENCOUNTER — Ambulatory Visit (HOSPITAL_COMMUNITY): Admission: RE | Admit: 2016-08-12 | Payer: Self-pay | Source: Ambulatory Visit

## 2016-08-16 ENCOUNTER — Other Ambulatory Visit: Payer: Self-pay | Admitting: Cardiothoracic Surgery

## 2016-08-16 DIAGNOSIS — Z951 Presence of aortocoronary bypass graft: Secondary | ICD-10-CM

## 2016-08-19 ENCOUNTER — Ambulatory Visit (HOSPITAL_COMMUNITY): Admission: RE | Admit: 2016-08-19 | Payer: Self-pay | Source: Ambulatory Visit

## 2016-08-19 ENCOUNTER — Ambulatory Visit: Payer: No Typology Code available for payment source

## 2016-08-27 ENCOUNTER — Ambulatory Visit (HOSPITAL_COMMUNITY): Payer: Self-pay

## 2016-08-29 ENCOUNTER — Ambulatory Visit (HOSPITAL_COMMUNITY)
Admission: RE | Admit: 2016-08-29 | Discharge: 2016-08-29 | Disposition: A | Discharge status: Home / Self Care | Payer: Self-pay | Source: Ambulatory Visit | Attending: Cardiology | Admitting: Cardiology

## 2016-08-29 DIAGNOSIS — I361 Nonrheumatic tricuspid (valve) insufficiency: Secondary | ICD-10-CM | POA: Insufficient documentation

## 2016-08-29 DIAGNOSIS — Z720 Tobacco use: Secondary | ICD-10-CM | POA: Insufficient documentation

## 2016-08-29 DIAGNOSIS — R079 Chest pain, unspecified: Secondary | ICD-10-CM

## 2016-08-29 DIAGNOSIS — B182 Chronic viral hepatitis C: Secondary | ICD-10-CM | POA: Insufficient documentation

## 2016-08-29 DIAGNOSIS — I119 Hypertensive heart disease without heart failure: Secondary | ICD-10-CM | POA: Insufficient documentation

## 2016-08-29 NOTE — Progress Notes (Signed)
  Echocardiogram 2D Echocardiogram has been performed.  Arvil Chaco 08/29/2016, 11:33 AM

## 2016-09-03 ENCOUNTER — Ambulatory Visit (INDEPENDENT_AMBULATORY_CARE_PROVIDER_SITE_OTHER): Payer: Self-pay | Admitting: Surgical

## 2016-09-03 ENCOUNTER — Ambulatory Visit
Admission: RE | Admit: 2016-09-03 | Discharge: 2016-09-03 | Disposition: A | Discharge status: Home / Self Care | Payer: Self-pay | Source: Ambulatory Visit | Attending: Cardiothoracic Surgery | Admitting: Cardiothoracic Surgery

## 2016-09-03 VITALS — BP 150/100 | HR 88 | Resp 20 | Ht 69.0 in | Wt 210.0 lb

## 2016-09-03 DIAGNOSIS — L7682 Other postprocedural complications of skin and subcutaneous tissue: Secondary | ICD-10-CM

## 2016-09-03 DIAGNOSIS — Z951 Presence of aortocoronary bypass graft: Secondary | ICD-10-CM

## 2016-09-03 NOTE — Patient Instructions (Signed)
Instructed on routine activity advancement and return to work light duty

## 2016-09-03 NOTE — Progress Notes (Signed)
[address]       [place] [address]             [phone]           [address] [place] [address]       [place] [address]             [phone]                  Edward Farmer [facility] Medical Record #[id number] Date of Birth: Sep 03, 1960  Referring BM:WUXLK, Delton See, MD Primary Cardiology: Primary Care:[name],[name], NP  Chief Complaint:  Follow Up Visit   DATE OF PROCEDURE:  07/17/2016 DATE OF DISCHARGE:                              OPERATIVE REPORT   PREOPERATIVE DIAGNOSIS:  Unstable angina with high-grade LAD (left anterior descending) disease.  POSTOPERATIVE DIAGNOSIS:  Unstable angina with high-grade LAD (left anterior descending) disease.  SURGICAL PROCEDURE:  Urgent coronary artery bypass grafting x1 with the left internal mammary to the left anterior descending coronary artery off pump.  SURGEON:  Sheliah Plane, MD.  FIRST ASSISTANT:  Doree Fudge, [place].    History of Present Illness:    The patient is a 56 year old male status post the above described procedure. He is seen in the office on today's date in follow-up primarily because he continues to have complaints of sternal discomfort. He denies any other significant symptoms.     Zubrod Score: At the time of surgery this patient's most appropriate activity status/level should be described as:     0    Normal activity, no symptoms     1    Restricted in physical strenuous activity but ambulatory, able to do out light work     2    Ambulatory and capable of self care, unable to do work activities, up and about                 >50 % of waking hours                                                                                       3    Only limited self care, in bed greater than 50% of waking hours     4    Completely disabled, no self care, confined to bed or chair     5    Moribund  History  Smoking Status  . Current Every Day  Smoker  . Packs/day: 0.25  . Types: Cigarettes  Smokeless Tobacco  . Never Used    Comment: cutting back       Allergies  Allergen Reactions  . No Known Allergies     Current Outpatient Prescriptions  Medication Sig Dispense Refill  . acetaminophen (TYLENOL) 325 MG tablet Take 2 tablets (650 mg total) by mouth every 6 (six) hours as needed for mild pain.    [name] aspirin EC 325 MG EC tablet Take 1 tablet (325 mg total) by mouth daily. 30 tablet 0  .  lisinopril-hydrochlorothiazide (PRINZIDE,ZESTORETIC) 20-25 MG tablet Take 1 tablet by mouth daily.    . metoprolol tartrate (LOPRESSOR) 25 MG tablet Take 2 tablets (50 mg total) by mouth 2 (two) times daily. 30 tablet 1  . simvastatin (ZOCOR) 20 MG tablet Take 1 tablet (20 mg total) by mouth daily at 6 PM. 30 tablet 1  . oxyCODONE (OXY IR/ROXICODONE) 5 MG immediate release tablet Take 5 mg by mouth every 4-6 hours PRN severe pain (Patient not taking: Reported on 09/03/2016) 15 tablet 0   No current facility-administered medications for this visit.        Physical Exam: BP (!) 150/100 (BP Location: Left Arm, Cuff Size: Large)   Pulse 88   Resp 20   Ht  (1.753 m)   Wt 210 lb (95.3 kg)   SpO2 98% Comment: RA  BMI 31.01 kg/m   General appearance: alert, cooperative and no distress Heart: regular rate and rhythm Lungs: clear to auscultation bilaterally Abdomen: soft, non-tender; bowel sounds normal; no masses,  no organomegaly Extremities: no edema, redness or tenderness in the calves or thighs Wounds: All well healed  Diagnostic Studies & Laboratory data:         Recent Radiology Findings: Dg Chest 2 View  Result Date: 09/03/2016 CLINICAL DATA:  Exertional shortness of breath and mild chest discomfort since CABG on July 17, 2016. EXAM: CHEST  2 VIEW COMPARISON:  Chest x-ray of August 05, 2016 FINDINGS: The lungs are well-expanded. There is no focal infiltrate. There is no pleural effusion or pneumothorax. The cardiac  silhouette is mildly enlarged but less conspicuous than on the previous study. The pulmonary vascularity is normal. The mediastinum is normal in width. The sternal wires are intact. There is calcification in the wall of the aortic arch. IMPRESSION: Mild cardiomegaly improved since a previous study. No pulmonary vascular congestion or pulmonary edema. No other acute cardiopulmonary abnormality. Thoracic aortic atherosclerosis. Electronically Signed   By: David  [place] M.D.   On: 09/03/2016 10:22      I have independently reviewed the above radiology findings and reviewed findings  with the patient.  Recent Labs: Lab Results  Component Value Date   WBC 18.3 (H) 07/20/2016   HGB 12.1 (L) 07/20/2016   HCT 37.6 (L) 07/20/2016   PLT 170 07/20/2016   GLUCOSE 93 07/20/2016   CHOL 174 07/14/2016   TRIG 231 (H) 07/14/2016   HDL 34 (L) 07/14/2016   LDLCALC 94 07/14/2016   ALT 22 07/15/2016   AST 24 07/15/2016   NA 136 07/20/2016   K 4.3 07/20/2016   CL 101 07/20/2016   CREATININE 1.23 07/20/2016   BUN 16 07/20/2016   CO2 22 07/20/2016   TSH 0.856 07/29/2007   INR 1.20 07/17/2016   HGBA1C 5.8 (H) 07/16/2016      Assessment / Plan:  The patient is doing quite well. I informed him that we could not refill his pain prescription at this time. We had a discussion related to trying to avoid the cycle of opiate addiction. We discussed the fact that some discomfort is still normal and he appears to understand. He also does mention that he is doing much less of his routine activity and that may actually be playing a part to some of his discomfort. I informed him that he could return to work on light duty basis at this time. We will see again on a when necessary basis. He will continue to follow-up with cardiology and in particular layer managing  his blood pressure regimen.       Christy Friede E 09/03/2016 1:31 PM

## 2016-10-05 NOTE — Addendum Note (Signed)
Addendum  created 10/05/16 1023 by Eureka Valdes, MD   Sign clinical note    

## 2016-10-10 NOTE — Congregational Nurse Program (Signed)
Congregational Nurse Program Note  Date of Encounter: 09/13/2016  Past Medical History: Past Medical History:  Diagnosis Date  . Asthma   . Bradycardia 07/14/2016   on BB. Rate improved with discontinuation.   . Hypertension   . Tobacco abuse     Encounter Details:     CNP Questionnaire - 09/13/16 1639      Patient Demographics   Is this a new or existing patient? New   Patient is considered a/an Not Applicable   Race African-American/Black     Patient Assistance   Location of Patient Assistance Not Applicable   Patient's financial/insurance status [employer];Low Income   Uninsured Patient ([employer], [employer]Card/[employer]s) Yes   Interventions Not Applicable   Patient referred to apply for the following financial assistance Not Applicable   Food insecurities addressed Not Applicable   Transportation assistance No   Assistance securing medications No   Educational health offerings Navigating the healthcare system     Encounter Details   Primary purpose of visit Navigating the Healthcare System   Was an Emergency Department visit averted? Not Applicable   Does patient have a medical provider? Yes   Patient referred to Not Applicable   Was a mental health screening completed? (GAINS tool) No   Does patient have dental issues? Yes   Was a dental referral made? Yes   Does patient have vision issues? No   Does your patient have an abnormal blood pressure today? No   Since previous encounter, have you referred patient for abnormal blood pressure that resulted in a new diagnosis or medication change? No   Does your patient have an abnormal blood glucose today? No   Since previous encounter, have you referred patient for abnormal blood glucose that resulted in a new diagnosis or medication change? No   Was there a life-saving intervention made? No       Client confused about upcoming appointment with his dentist.  Vilma Meckelontacted Mary Ann [name] NP, his HCP and she gave  clarity to the client.  He was told, he would be notified by the dentist office

## 2016-12-30 DIAGNOSIS — Z736 Limitation of activities due to disability: Secondary | ICD-10-CM

## 2017-01-28 ENCOUNTER — Emergency Department (HOSPITAL_COMMUNITY)
Admission: EM | Admit: 2017-01-28 | Discharge: 2017-01-28 | Discharge status: Against Medical Advice | Payer: Self-pay | Attending: Emergency Medicine | Admitting: Emergency Medicine

## 2017-01-28 ENCOUNTER — Emergency Department (HOSPITAL_COMMUNITY): Payer: Self-pay

## 2017-01-28 ENCOUNTER — Encounter (HOSPITAL_COMMUNITY): Payer: Self-pay | Admitting: Emergency Medicine

## 2017-01-28 DIAGNOSIS — R079 Chest pain, unspecified: Secondary | ICD-10-CM | POA: Insufficient documentation

## 2017-01-28 DIAGNOSIS — Z7982 Long term (current) use of aspirin: Secondary | ICD-10-CM | POA: Insufficient documentation

## 2017-01-28 DIAGNOSIS — I10 Essential (primary) hypertension: Secondary | ICD-10-CM | POA: Insufficient documentation

## 2017-01-28 DIAGNOSIS — F17210 Nicotine dependence, cigarettes, uncomplicated: Secondary | ICD-10-CM | POA: Insufficient documentation

## 2017-01-28 DIAGNOSIS — J45909 Unspecified asthma, uncomplicated: Secondary | ICD-10-CM | POA: Insufficient documentation

## 2017-01-28 DIAGNOSIS — Z79899 Other long term (current) drug therapy: Secondary | ICD-10-CM | POA: Insufficient documentation

## 2017-01-28 DIAGNOSIS — R9431 Abnormal electrocardiogram [ECG] [EKG]: Secondary | ICD-10-CM

## 2017-01-28 DIAGNOSIS — Z951 Presence of aortocoronary bypass graft: Secondary | ICD-10-CM | POA: Insufficient documentation

## 2017-01-28 LAB — BASIC METABOLIC PANEL
Anion gap: 8 (ref 5–15)
BUN: 10 mg/dL (ref 6–20)
CHLORIDE: 106 mmol/L (ref 101–111)
CO2: 23 mmol/L (ref 22–32)
Calcium: 9.6 mg/dL (ref 8.9–10.3)
Creatinine, Ser: 0.98 mg/dL (ref 0.61–1.24)
Glucose, Bld: 95 mg/dL (ref 65–99)
POTASSIUM: 4 mmol/L (ref 3.5–5.1)
SODIUM: 137 mmol/L (ref 135–145)

## 2017-01-28 LAB — I-STAT TROPONIN, ED: Troponin i, poc: 0.02 ng/mL (ref 0.00–0.08)

## 2017-01-28 LAB — CBC
HEMATOCRIT: 44.2 % (ref 39.0–52.0)
Hemoglobin: 14.7 g/dL (ref 13.0–17.0)
MCH: 28.9 pg (ref 26.0–34.0)
MCHC: 33.3 g/dL (ref 30.0–36.0)
MCV: 86.8 fL (ref 78.0–100.0)
PLATELETS: 236 10*3/uL (ref 150–400)
RBC: 5.09 MIL/uL (ref 4.22–5.81)
RDW: 14.7 % (ref 11.5–15.5)
WBC: 9.1 10*3/uL (ref 4.0–10.5)

## 2017-01-28 MED ORDER — ASPIRIN 81 MG PO CHEW
324.0000 mg | CHEWABLE_TABLET | Freq: Once | ORAL | Status: AC
  Administered 2017-01-28: 324 mg via ORAL
  Filled 2017-01-28: qty 4

## 2017-01-28 NOTE — Discharge Instructions (Signed)
Please go to your upcoming cardiology appointment and follow up with your PCP for your chest pain. Although we did not complete your workup and follow final recommendations by the cardiology team, if any symptoms return or worsen, please return immediately to the nearest emergency department. You are leaving against our medical advice.

## 2017-01-28 NOTE — H&P (Signed)
The patient has been seen in conjunction with Robbie Lis, PA-C. All aspects of care have been considered and discussed. The patient has been personally interviewed, examined, and all clinical data has been reviewed.   The patient left the emergency room before I could evaluate him.    Cardiology Admission History and Physical:   Patient ID: Edward Farmer; MRN: [id number]; DOB: 1960/06/29   Admission date: 01/28/2017  Primary Care Provider: Lavinia Sharps, NP Primary Cardiologist: Dr. Allyson Sabal    Chief Complaint:  Chest Pain   Patient Profile:   Edward Farmer is a 56 y.o. male with a history of CAD s/p CABG x 1 w/ LIMA-LAD 07/2016,  HTN and tobacco abuse, hepatitis C and family h/o premature CAD, presenting to the ED with complaint of chest pain.   History of Present Illness:   As outlined above, he has known CAD. He was admitted earlier this year 3/11/8 with chest pain and abnormal EKG showing new TWIs. He ruled out for MI but was taken to the cath lab and found to have CAD with 45% LM, an ulcerated 85% proximal LAD, 40% mid CFX, and a small moderately diseased RCA. Given his high grade proximal LAD disease, he was referred for CABG and underwent bypass x 1 with LIMA-LAD by Dr. Lowella Fairy. His postoperative course was uneventful. 2D echo during that time showed normal LVEF at 50-55% w/ normal wall. He was placed on ASA, metoprolol and simvastatin. He was seen back for post hospital f/u, after his CABG but has not been seen since then. Last OV was 08/2016. He has a scheduled 6 month office visit with Riki Sheer on 02/06/17.   Patient reports that he went to his PCP office today given his recent chest pain. He states that his PCP ordered an EKG that was abnormal and he was subsequently sent to the emergency department for further workup and evaluation.  He has had intermittent chest pain for the past 2 weeks. He notes he has had about 3 episodes. The pain is different from the pain he  had previous to his bypass surgery. His pain is not as severe. He has had some atypical features. He notes that he felt sharp left-sided chest pain when he picked up his daughter the other day. Pain went away after he put her down. He denies any exertional chest pressure/tightness or exertional dyspnea. He reports full medication compliance at home. He continues to use vapor cigarettes. He is currently chest pain-free in the ED.  EKG in the ED shows NSR with new inferolateral TWIs. BP is also elevated in the ED in the 150s-180s systolic and in the 90s-low 100s diastolic. CBC and BMP unremarkable.    Past Medical History:  Diagnosis Date  . Asthma   . Bradycardia 07/14/2016   on BB. Rate improved with discontinuation.   . Hypertension   . Tobacco abuse     Past Surgical History:  Procedure Laterality Date  . CORONARY ARTERY BYPASS GRAFT N/A 07/17/2016   Procedure: CORONARY ARTERY BYPASS GRAFTING (CABG) x 1. OFF PUMP. LIMA TO LAD.;  Surgeon: Delight Ovens, MD;  Location: [facility] OR;  Service: Open Heart Surgery;  Laterality: N/A;  . LEFT HEART CATH AND CORONARY ANGIOGRAPHY N/A 07/16/2016   Procedure: Left Heart Cath and Coronary Angiography;  Surgeon: Marykay Lex, MD;  Location: [facility] INVASIVE CV LAB;  Service: Cardiovascular;  Laterality: N/A;  . TEE WITHOUT CARDIOVERSION N/A 07/17/2016   Procedure: TRANSESOPHAGEAL ECHOCARDIOGRAM (TEE);  Surgeon: Delight Ovens, MD;  Location: [facility] OR;  Service: Open Heart Surgery;  Laterality: N/A;     Medications Prior to Admission: Prior to Admission medications   Medication Sig Start Date End Date Taking? Authorizing Provider  acetaminophen (TYLENOL) 325 MG tablet Take 2 tablets (650 mg total) by mouth every 6 (six) hours as needed for mild pain. 07/22/16  Yes Doree Fudge M, PA-C  aspirin EC 325 MG EC tablet Take 1 tablet (325 mg total) by mouth daily. 07/22/16  Yes Doree Fudge M, PA-C  lisinopril-hydrochlorothiazide (PRINZIDE,ZESTORETIC)  20-25 MG tablet Take 1 tablet by mouth daily.   Yes [provider]  metoprolol tartrate (LOPRESSOR) 25 MG tablet Take 2 tablets (50 mg total) by mouth 2 (two) times daily. 08/05/16  Yes Asa Lente, Tessa N, PA-C  simvastatin (ZOCOR) 20 MG tablet Take 1 tablet (20 mg total) by mouth daily at 6 PM. 07/22/16  Yes Doree Fudge M, PA-C  oxyCODONE (OXY IR/ROXICODONE) 5 MG immediate release tablet Take 5 mg by mouth every 4-6 hours PRN severe pain Patient not taking: Reported on 09/03/2016 08/05/16   Sharlene Dory, PA-C     Allergies:   No Active Allergies  Social History:   Social History   Social History  . Marital status: Legally Separated    Spouse name: N/A  . Number of children: N/A  . Years of education: N/A   Occupational History  . Not on file.   Social History Main Topics  . Smoking status: Current Every Day Smoker    Packs/day: 0.25    Types: Cigarettes  . Smokeless tobacco: Never Used     Comment: cutting back  . Alcohol use 0.0 oz/week     Comment: occ  . Drug use: No  . Sexual activity: Not on file   Other Topics Concern  . Not on file   Social History Narrative  . No narrative on file    Family History:   The patient's family history includes Heart attack in his father; Migraines in his mother.    ROS:  Please see the history of present illness.  All other ROS reviewed and negative.     Physical Exam/Data:   Vitals:   01/28/17 0945 01/28/17 1000 01/28/17 1130 01/28/17 1200  BP: (!) 177/119 (!) 178/104 (!) 176/116 (!) 188/103  Pulse:   68 (!) 47  Resp: (!) 25 (!) 21 (!) 25 19  Temp:      TempSrc:      SpO2:   95% 96%  Weight:      Height:       No intake or output data in the 24 hours ending 01/28/17 1212 Filed Weights   01/28/17 0700  Weight: 207 lb (93.9 kg)   Body mass index is 29.7 kg/m.  General:  Well nourished, well developed, in no acute distress HEENT: normal Lymph: no adenopathy Neck: no JVD Endocrine:  No  thryomegaly Vascular: No carotid bruits; FA pulses 2+ bilaterally without bruits  Cardiac:  normal S1, S2; RRR; no murmur  Lungs:  clear to auscultation bilaterally, no wheezing, rhonchi or rales  Abd: soft, nontender, no hepatomegaly  Ext: noedema Musculoskeletal:  No deformities, BUE and BLE strength normal and equal Skin: warm and dry  Neuro:  CNs 2-12 intact, no focal abnormalities noted Psych:  Normal affect    EKG:  The ECG that was done was personally reviewed and demonstrates NSR w/ inferolateral TWIs  Relevant CV Studies [facility] 07/2016  Conclusion     LM lesion, 45 %stenosed. Does not compromise flow to the circumflex or ramus  Ost LAD lesion, 85 %stenosed.  The left ventricular systolic function is normal. The left ventricular ejection fraction is greater than 65% by visual estimate.  LV end diastolic pressure is severely elevated.  There is no aortic valve stenosis.    Laboratory Data:  Chemistry Recent Labs Lab 01/28/17 0731  NA 137  K 4.0  CL 106  CO2 23  GLUCOSE 95  BUN 10  CREATININE 0.98  CALCIUM 9.6  GFRNONAA >60  GFRAA >60  ANIONGAP 8    No results for input(s): PROT, ALBUMIN, AST, ALT, ALKPHOS, BILITOT in the last 168 hours. Hematology Recent Labs Lab 01/28/17 0731  WBC 9.1  RBC 5.09  HGB 14.7  HCT 44.2  MCV 86.8  MCH 28.9  MCHC 33.3  RDW 14.7  PLT 236   Cardiac EnzymesNo results for input(s): TROPONINI in the last 168 hours.  Recent Labs Lab 01/28/17 0814  TROPIPOC 0.02    BNPNo results for input(s): BNP, PROBNP in the last 168 hours.  DDimer No results for input(s): DDIMER in the last 168 hours.  Radiology/Studies:  Dg Chest 2 View  Result Date: 01/28/2017 CLINICAL DATA:  Chest pain EXAM: CHEST  2 VIEW COMPARISON:  09/03/2016 FINDINGS: Mild cardiomegaly, stable. Status post CABG. Mild perihilar interstitial coarsening that is chronic. Calcified granuloma in the left mid lung. There is no edema, consolidation, effusion, or  pneumothorax. No acute osseous finding. IMPRESSION: Stable from prior.  No evidence of acute disease. Electronically Signed   By: Marnee Spring M.D.   On: 01/28/2017 07:31    Assessment and Plan:   1. Chest Pain w/ Abnormal EKG: patient has known CAD status post single-vessel bypass with LIMA to the LAD for high-grade proximal LAD lesion March 2018. Left Ventricular EF was normal. He now presents with atypical chest pain however his EKG is abnormal with inferolateral T-wave inversions which are new. Point of care troponin is negative 1 He is also markedly hypertensive in the ED with systolic blood pressures ranging in the 150s to 180s systolic and diastolic blood pressures in the 100s diastolic. He reports full medication compliance but notes that he did not take his morning medications prior to coming to the emergency department today. Given his history, abnormal EKG and high blood pressure he needs to be admitted for blood pressure control, monitoring and further ischemic evaluation. Will need to cycle cardiac enzymes x 3.   For questions or updates, please contact [facility] Please consult [email or web address] for contact info under Cardiology/STEMI.    Signed, Robbie Lis, PA-C  01/28/2017 12:12 PM

## 2017-01-28 NOTE — ED Triage Notes (Signed)
Patient here with week long history of chest pain.  He states that it is sharp in nature, comes and goes.  He states that he does not have any shortness of breath or nausea/vomiting.  Patient states that it has been getting worse since last week.

## 2017-01-28 NOTE — ED Provider Notes (Signed)
[facility]-EMERGENCY DEPT Provider Note   CSN: [id number] Arrival date & time: 01/28/17  4098     History   Chief Complaint Chief Complaint  Patient presents with  . Chest Pain    HPI Edward Farmer is a 56 y.o. male.  The history is provided by the patient and medical records.  Chest Pain   This is a new problem. The current episode started more than 1 week ago. The problem occurs daily. The problem has been resolved. The pain is associated with exertion. The pain is present in the substernal region. The pain is at a severity of 6/10. The pain is moderate. The quality of the pain is described as exertional, heavy and pressure-like. The pain does not radiate. The symptoms are aggravated by exertion. Pertinent negatives include no abdominal pain, no back pain, no cough, no diaphoresis, no fever, no headaches, no malaise/fatigue, no nausea, no numbness, no palpitations, no shortness of breath, no sputum production, no syncope and no vomiting. He has tried rest for the symptoms. The treatment provided moderate relief.  His past medical history is significant for CAD and MI.    Past Medical History:  Diagnosis Date  . Asthma   . Bradycardia 07/14/2016   on BB. Rate improved with discontinuation.   . Hypertension   . Tobacco abuse     Patient Active Problem List   Diagnosis Date Noted  . Pain at surgical incision 09/03/2016  . Chest pain at rest 08/05/2016  . Smoker 08/05/2016  . Hx of CABG 07/17/2016  . Family history of premature CAD 07/16/2016  . Unstable angina (HCC) 07/14/2016  . AKI (acute kidney injury) (HCC) 07/14/2016  . Chronic hepatitis C without hepatic coma (HCC) 01/27/2014  . Asthma, chronic 01/27/2014  . Essential hypertension, benign 01/27/2014  . Obesity, unspecified 01/27/2014    Past Surgical History:  Procedure Laterality Date  . CORONARY ARTERY BYPASS GRAFT N/A 07/17/2016   Procedure: CORONARY ARTERY BYPASS GRAFTING (CABG) x 1. OFF PUMP. LIMA TO LAD.;   Surgeon: Delight Ovens, MD;  Location: [facility] OR;  Service: Open Heart Surgery;  Laterality: N/A;  . LEFT HEART CATH AND CORONARY ANGIOGRAPHY N/A 07/16/2016   Procedure: Left Heart Cath and Coronary Angiography;  Surgeon: Marykay Lex, MD;  Location: [facility] INVASIVE CV LAB;  Service: Cardiovascular;  Laterality: N/A;  . TEE WITHOUT CARDIOVERSION N/A 07/17/2016   Procedure: TRANSESOPHAGEAL ECHOCARDIOGRAM (TEE);  Surgeon: Delight Ovens, MD;  Location: [facility] OR;  Service: Open Heart Surgery;  Laterality: N/A;       Home Medications    Prior to Admission medications   Medication Sig Start Date End Date Taking? Authorizing Provider  acetaminophen (TYLENOL) 325 MG tablet Take 2 tablets (650 mg total) by mouth every 6 (six) hours as needed for mild pain. 07/22/16   Ardelle Balls, PA-C  aspirin EC 325 MG EC tablet Take 1 tablet (325 mg total) by mouth daily. 07/22/16   Ardelle Balls, PA-C  lisinopril-hydrochlorothiazide (PRINZIDE,ZESTORETIC) 20-25 MG tablet Take 1 tablet by mouth daily.    [provider]  metoprolol tartrate (LOPRESSOR) 25 MG tablet Take 2 tablets (50 mg total) by mouth 2 (two) times daily. 08/05/16   Sharlene Dory, PA-C  oxyCODONE (OXY IR/ROXICODONE) 5 MG immediate release tablet Take 5 mg by mouth every 4-6 hours PRN severe pain Patient not taking: Reported on 09/03/2016 08/05/16   Sharlene Dory, PA-C  simvastatin (ZOCOR) 20 MG tablet Take 1 tablet (20 mg  total) by mouth daily at 6 PM. 07/22/16   Ardelle Balls, PA-C    Family History Family History  Problem Relation Age of Onset  . Migraines Mother   . Heart attack Father     Social History Social History  Substance Use Topics  . Smoking status: Current Every Day Smoker    Packs/day: 0.25    Types: Cigarettes  . Smokeless tobacco: Never Used     Comment: cutting back  . Alcohol use 0.0 oz/week     Comment: occ     Allergies   No known allergies   Review of Systems Review of Systems    Constitutional: Negative for chills, diaphoresis, fatigue, fever and malaise/fatigue.  HENT: Negative for congestion.   Eyes: Negative for visual disturbance.  Respiratory: Negative for cough, sputum production, choking, chest tightness and shortness of breath.   Cardiovascular: Positive for chest pain. Negative for palpitations and syncope.  Gastrointestinal: Negative for abdominal pain, constipation, diarrhea, nausea and vomiting.  Genitourinary: Negative for dysuria and flank pain.  Musculoskeletal: Negative for back pain and neck stiffness.  Skin: Negative for rash and wound.  Neurological: Negative for light-headedness, numbness and headaches.  Psychiatric/Behavioral: Negative for agitation.  All other systems reviewed and are negative.    Physical Exam Updated Vital Signs BP (!) 165/110 (BP Location: Right Arm)   Pulse 60   Temp (!) 97.5 F (36.4 C) (Oral)   Resp 18   Ht  (1.778 m)   Wt 93.9 kg (207 lb)   SpO2 96%   BMI 29.70 kg/m   Physical Exam  Constitutional: He appears well-developed and well-nourished. No distress.  HENT:  Head: Normocephalic and atraumatic.  Mouth/Throat: Oropharynx is clear and moist. No oropharyngeal exudate.  Eyes: Pupils are equal, round, and reactive to light. Conjunctivae and EOM are normal.  Neck: Normal range of motion. Neck supple.  Cardiovascular: Normal rate, regular rhythm and intact distal pulses.   No murmur heard. Pulmonary/Chest: Effort normal and breath sounds normal. No stridor. No respiratory distress. He has no wheezes. He has no rales. He exhibits no tenderness.  Abdominal: Soft. There is no tenderness.  Musculoskeletal: He exhibits no edema or tenderness.  Neurological: He is alert. No sensory deficit. He exhibits normal muscle tone.  Skin: Skin is warm and dry. Capillary refill takes less than 2 seconds. He is not diaphoretic. No erythema. No pallor.  Psychiatric: He has a normal mood and affect.  Nursing note and  vitals reviewed.    ED Treatments / Results  Labs (all labs ordered are listed, but only abnormal results are displayed) Labs Reviewed  BASIC METABOLIC PANEL  CBC  I-STAT TROPONIN, ED    EKG  EKG Interpretation  Date/Time:  Tuesday January 28 2017 06:57:20 EDT Ventricular Rate:  80 PR Interval:  184 QRS Duration: 86 QT Interval:  390 QTC Calculation: 449 R Axis:   31 Text Interpretation:  Normal sinus rhythm Possible Left atrial enlargement T wave abnormality, consider inferolateral ischemia Abnormal ECG When compared to prior, T waves inversions deeper in 2,3,AVF, V4, V5, V6.  No STEMI Confirmed by Theda Belfast ([id number]) on 01/28/2017 9:36:58 AM       Radiology Dg Chest 2 View  Result Date: 01/28/2017 CLINICAL DATA:  Chest pain EXAM: CHEST  2 VIEW COMPARISON:  09/03/2016 FINDINGS: Mild cardiomegaly, stable. Status post CABG. Mild perihilar interstitial coarsening that is chronic. Calcified granuloma in the left mid lung. There is no edema, consolidation, effusion, or pneumothorax.  No acute osseous finding. IMPRESSION: Stable from prior.  No evidence of acute disease. Electronically Signed   By: Marnee Spring M.D.   On: 01/28/2017 07:31    Procedures Procedures (including critical care time)  Medications Ordered in ED Medications  aspirin chewable tablet 324 mg (324 mg Oral Given 01/28/17 0957)     Initial Impression / Assessment and Plan / ED Course  I have reviewed the triage vital signs and the nursing notes.  Pertinent labs & imaging results that were available during my care of the patient were reviewed by me and considered in my medical decision making (see chart for details).     Edward Farmer is a 56 y.o. male with a past medical history significant for hypertension, asthma, hepatitis C, and CAD status post MI and CABG in March of this year who presents with chest pain. Patient reports that last week, he has a partially 4 episodes of intermittent chest  pain. He says that it feels somewhat similar to the chest pain he had with his prior MI. He describes the pain as sharp pain in his left chest. He says it does not radiate and is not associated with shortness of breath, diaphoresis, nausea, or vomiting. He does report that it was worsened with exertion and was better with rest. He says that he called his PCP who instructed him to see his cardiologist for his discomfort but  cannot see him until October 4 and then his PCP instructed him to come to the ED last night. Patient said that he decided to with until this morning and has had another episode of the pain. He is describes the pain as moderate to severe and sharp. He says that he did not take his aspirin this morning as he came directly here. He denies any recent traumatic injuries or other complaints.  On exam, patient's chest is nontender. Lungs are clear. Abdomen is nontender. No significant laboratory swelling or edema. Patient's neuro exam intact and patient has symmetric pulses in upper extremities.  Initial EKG showed some new T-wave inversions and deeper T waves in multiple leads. Patient was workup to look for a cardiac cause of his symptoms with his history.  Initial troponin returned negative.  Cardiology will be called for management recommendations given the patient's EKG changes and pain similar to prior.  1:26 PM Laboratory testing and chest x-ray were all reassuring.   Cardiology was called and while awaiting her final recognition, patient decided he wanted to leave AGAINST MEDICAL ADVICE. He said that he "couldn't wait any longer". He understands the risks of worsened cardiac symptoms, heart attack, and even death from leaving. Patient says that he will follow up with his PCP. He understood that the cardiology team may want to admit him but they were still delivering on their recommendations.  Patient will sign out AGAINST MEDICAL ADVICE. If the patient returns, and anticipate  speaking with cardiology again for admission.    Final Clinical Impressions(s) / ED Diagnoses   Final diagnoses:  Exertional chest pain      Date: 01/28/2017 Patient: Coren Sagan or his authorized caregiver has made the decision for the patient to leave the emergency department against the advice of Yoshi Vicencio, Canary Brim, *. He or his authorized caregiver has been informed about any test results if available and understands the inherent risks of leaving, including permanent disability or death. He or his authorized caregiver has decided to accept the responsibility for  this decision. He or his authorized caregiver is not intoxicated and has demonstrated decision making capacity. [name] and all necessary parties have been advised that he may return for further evaluation or treatment at any time and they are recommended alternative follow up if continually unwilling. His condition at time of discharge was Fair.  Vital signs Blood pressure (!) 188/103, pulse (!) 47, temperature (!) 97.5 F (36.4 C), temperature source Oral, resp. rate 19, height  (1.778 m), weight 93.9 kg (207 lb), SpO2 96 %.       Manas Hickling, Canary Brim, MD 01/29/17 2154

## 2017-01-28 NOTE — ED Notes (Signed)
Pt refusing to stay for admission after speaking with EDP and cards. Dr. Rush Landmark preparing AMA papers. Pt getting dressed at this time.

## 2017-01-28 NOTE — ED Notes (Signed)
Pt verbalized concern with how long it is taking to be seen by cards. Pt requesting food and I explained the need to wait until he is seen by cards. PT states if they don't come soon he is signing himself out. Dr. Rush Landmark at bedside

## 2017-02-06 ENCOUNTER — Ambulatory Visit: Payer: Self-pay | Admitting: Cardiology

## 2017-02-24 ENCOUNTER — Ambulatory Visit: Payer: Self-pay | Admitting: Cardiology

## 2017-02-24 ENCOUNTER — Encounter: Payer: Self-pay | Admitting: *Deleted

## 2017-06-05 IMAGING — DX DG CHEST 2V
2 series · 2 of 2 positions shown · non-contrast
Comparison: Chest x-ray of August 05, 2016

CLINICAL DATA: Exertional shortness of breath and mild chest
discomfort since CABG on July 17, 2016.

EXAM:
CHEST  2 VIEW

[dg chest 2 view (1 of 2)]
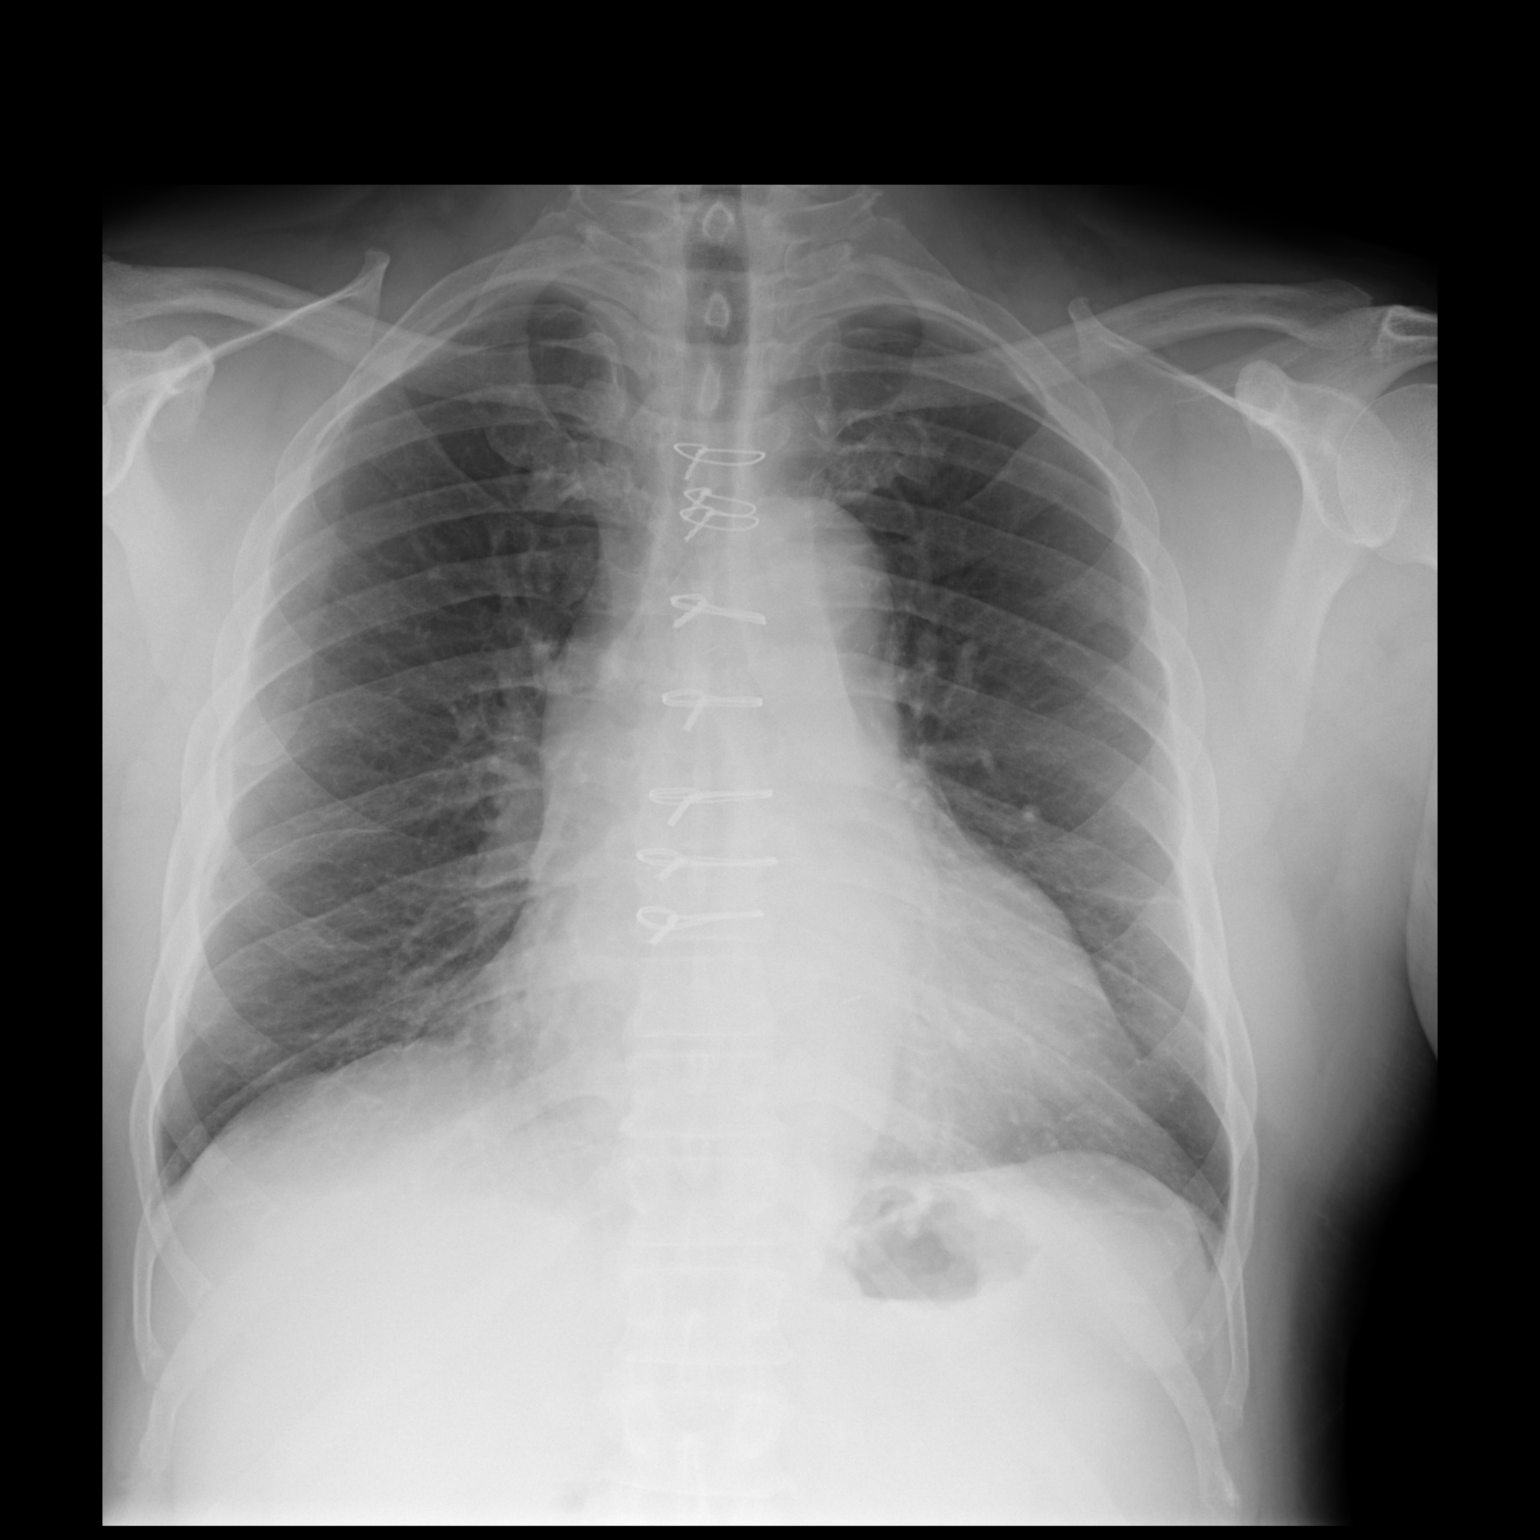

[dg chest 2 view (2 of 2)]
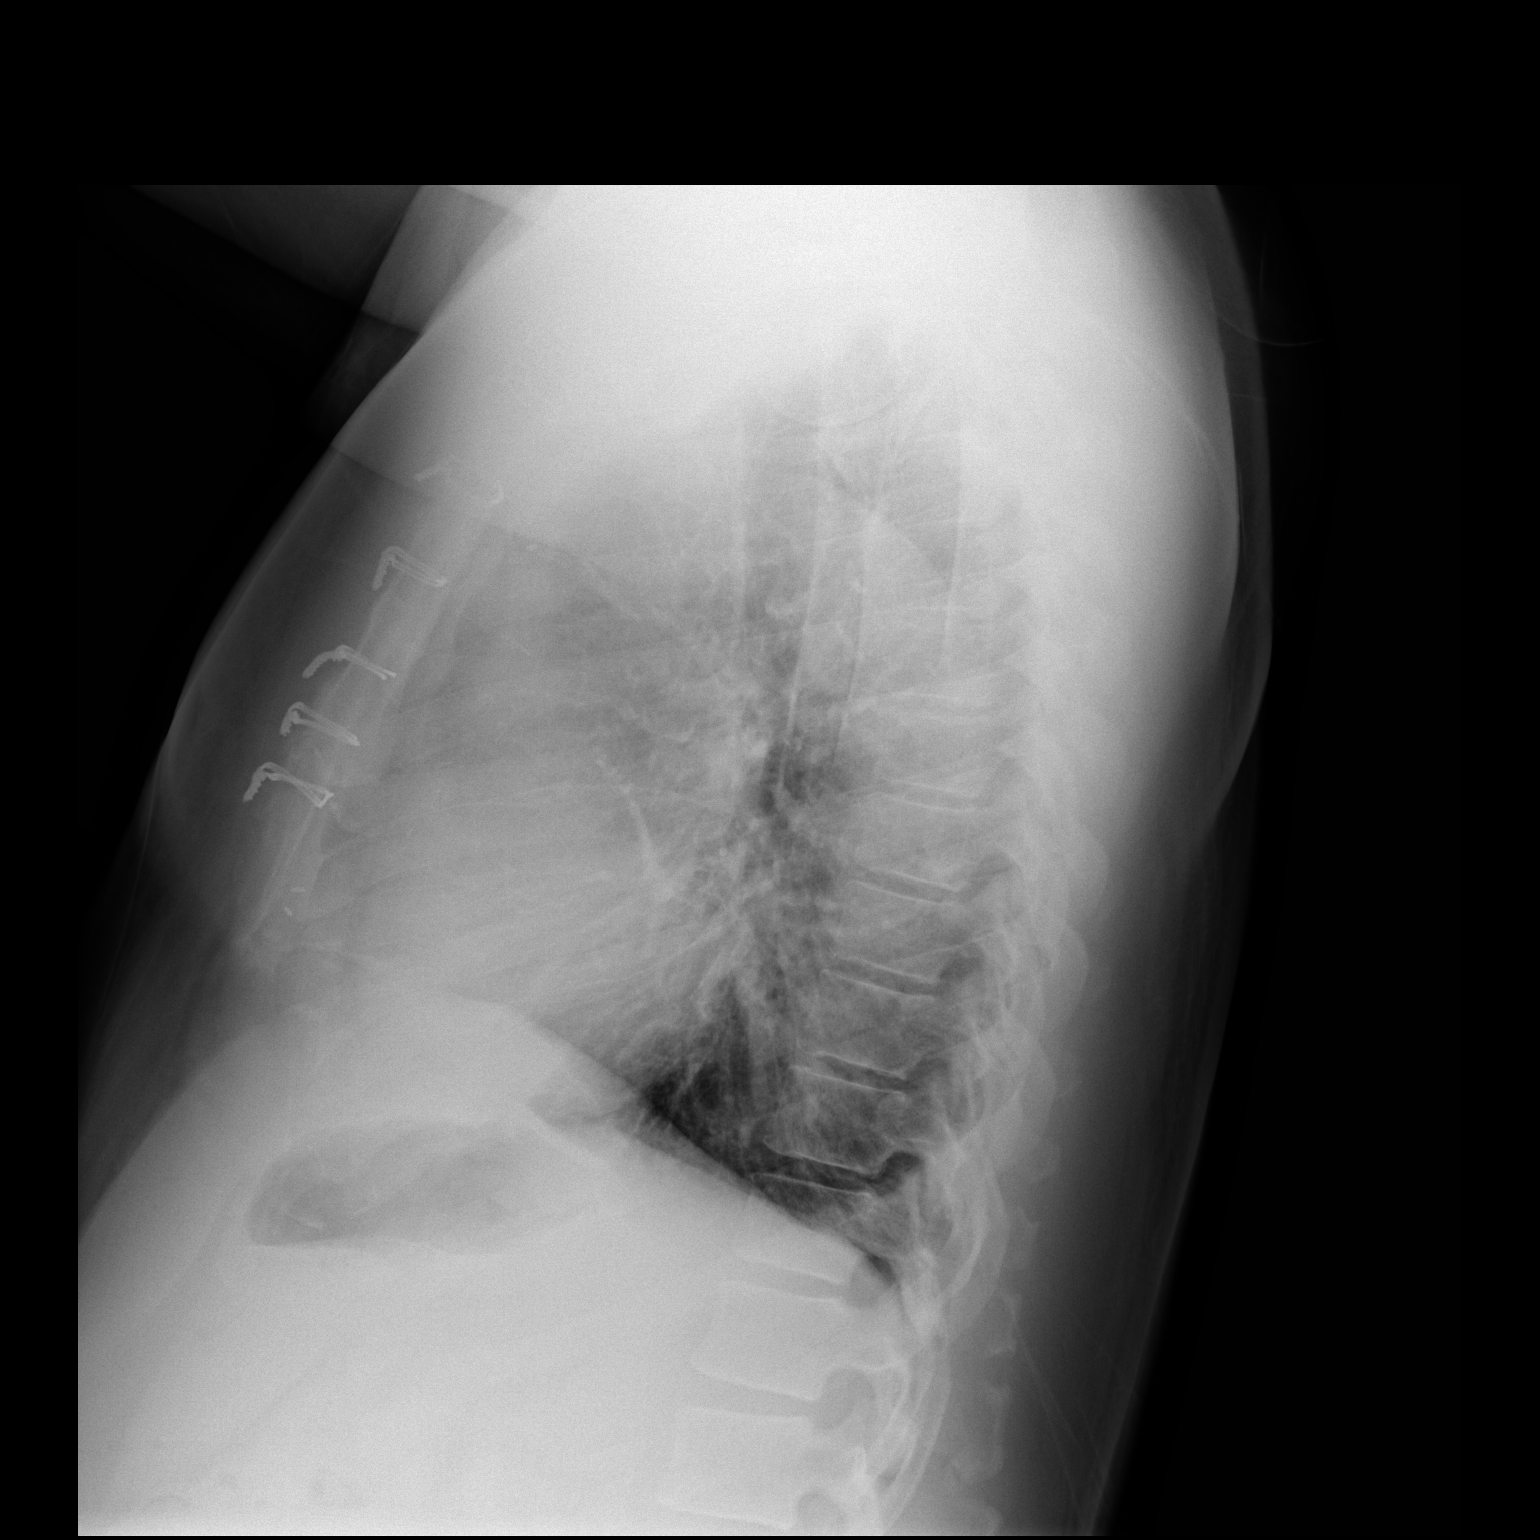

[2 of 2 positions shown; findings below may reference images not displayed]

FINDINGS: The lungs are well-expanded. There is no focal infiltrate. There is
no pleural effusion or pneumothorax. The cardiac silhouette is
mildly enlarged but less conspicuous than on the previous study. The
pulmonary vascularity is normal. The mediastinum is normal in width.
The sternal wires are intact. There is calcification in the wall of
the aortic arch.
IMPRESSION: Mild cardiomegaly improved since a previous study. No pulmonary
vascular congestion or pulmonary edema. No other acute
cardiopulmonary abnormality.

Thoracic aortic atherosclerosis.

## 2017-11-30 ENCOUNTER — Encounter (HOSPITAL_COMMUNITY): Payer: Self-pay | Admitting: Emergency Medicine

## 2017-11-30 ENCOUNTER — Emergency Department (HOSPITAL_COMMUNITY)
Admission: EM | Admit: 2017-11-30 | Discharge: 2017-11-30 | Disposition: A | Discharge status: Home / Self Care | Payer: Medicaid Other | Attending: Emergency Medicine | Admitting: Emergency Medicine

## 2017-11-30 ENCOUNTER — Emergency Department (HOSPITAL_COMMUNITY): Payer: Medicaid Other

## 2017-11-30 ENCOUNTER — Other Ambulatory Visit: Payer: Self-pay

## 2017-11-30 DIAGNOSIS — J45909 Unspecified asthma, uncomplicated: Secondary | ICD-10-CM | POA: Insufficient documentation

## 2017-11-30 DIAGNOSIS — T783XXA Angioneurotic edema, initial encounter: Secondary | ICD-10-CM

## 2017-11-30 DIAGNOSIS — F17210 Nicotine dependence, cigarettes, uncomplicated: Secondary | ICD-10-CM | POA: Diagnosis not present

## 2017-11-30 DIAGNOSIS — R0789 Other chest pain: Secondary | ICD-10-CM | POA: Diagnosis not present

## 2017-11-30 DIAGNOSIS — I10 Essential (primary) hypertension: Secondary | ICD-10-CM | POA: Diagnosis not present

## 2017-11-30 LAB — COMPREHENSIVE METABOLIC PANEL
ALBUMIN: 3.1 g/dL — AB (ref 3.5–5.0)
ALT: 19 U/L (ref 0–44)
AST: 23 U/L (ref 15–41)
Alkaline Phosphatase: 87 U/L (ref 38–126)
Anion gap: 7 (ref 5–15)
BILIRUBIN TOTAL: 0.9 mg/dL (ref 0.3–1.2)
BUN: 15 mg/dL (ref 6–20)
CHLORIDE: 111 mmol/L (ref 98–111)
CO2: 23 mmol/L (ref 22–32)
Calcium: 8.8 mg/dL — ABNORMAL LOW (ref 8.9–10.3)
Creatinine, Ser: 1.19 mg/dL (ref 0.61–1.24)
GFR calc Af Amer: >60 mL/min (ref >60)
GFR calc non Af Amer: >60 mL/min (ref >60)
Glucose, Bld: 87 mg/dL (ref 70–99)
POTASSIUM: 4.5 mmol/L (ref 3.5–5.1)
Sodium: 141 mmol/L (ref 135–145)
TOTAL PROTEIN: 6.8 g/dL (ref 6.5–8.1)

## 2017-11-30 LAB — CBC WITH DIFFERENTIAL/PLATELET
Abs Immature Granulocytes: 0 10*3/uL (ref 0.0–0.1)
BASOS PCT: 1 %
Basophils Absolute: 0 10*3/uL (ref 0.0–0.1)
EOS ABS: 0.2 10*3/uL (ref 0.0–0.7)
EOS PCT: 3 %
HEMATOCRIT: 44.2 % (ref 39.0–52.0)
Hemoglobin: 13.7 g/dL (ref 13.0–17.0)
Immature Granulocytes: 1 %
LYMPHS ABS: 3 10*3/uL (ref 0.7–4.0)
Lymphocytes Relative: 37 %
MCH: 27.4 pg (ref 26.0–34.0)
MCHC: 31 g/dL (ref 30.0–36.0)
MCV: 88.4 fL (ref 78.0–100.0)
Monocytes Absolute: 0.6 10*3/uL (ref 0.1–1.0)
Monocytes Relative: 7 %
NEUTROS PCT: 51 %
Neutro Abs: 4.2 10*3/uL (ref 1.7–7.7)
PLATELETS: 251 10*3/uL (ref 150–400)
RBC: 5 MIL/uL (ref 4.22–5.81)
RDW: 15.5 % (ref 11.5–15.5)
WBC: 8.1 10*3/uL (ref 4.0–10.5)

## 2017-11-30 LAB — BRAIN NATRIURETIC PEPTIDE: B NATRIURETIC PEPTIDE 5: 22.7 pg/mL (ref 0.0–100.0)

## 2017-11-30 LAB — TROPONIN I: Troponin I: <0.03 ng/mL (ref <0.03)

## 2017-11-30 MED ORDER — DIPHENHYDRAMINE HCL 25 MG PO TABS: 25.0000 mg | ORAL_TABLET | Freq: Four times a day (QID) | ORAL | 0 refills | Status: DC

## 2017-11-30 MED ORDER — FAMOTIDINE 20 MG PO TABS
20.0000 mg | ORAL_TABLET | Freq: Once | ORAL | Status: AC
  Administered 2017-11-30: 20 mg via ORAL
  Filled 2017-11-30: qty 1

## 2017-11-30 MED ORDER — PREDNISONE 20 MG PO TABS: ORAL_TABLET | ORAL | 0 refills | Status: DC

## 2017-11-30 MED ORDER — DIPHENHYDRAMINE HCL 25 MG PO CAPS
25.0000 mg | ORAL_CAPSULE | Freq: Once | ORAL | Status: AC
  Administered 2017-11-30: 25 mg via ORAL
  Filled 2017-11-30: qty 1

## 2017-11-30 MED ORDER — PREDNISONE 20 MG PO TABS
60.0000 mg | ORAL_TABLET | Freq: Once | ORAL | Status: AC
  Administered 2017-11-30: 60 mg via ORAL
  Filled 2017-11-30: qty 3

## 2017-11-30 MED ORDER — DIPHENHYDRAMINE HCL 50 MG/ML IJ SOLN
25.0000 mg | Freq: Once | INTRAMUSCULAR | Status: AC
  Administered 2017-11-30: 25 mg via INTRAVENOUS
  Filled 2017-11-30: qty 1

## 2017-11-30 MED ORDER — HYDROCHLOROTHIAZIDE 25 MG PO TABS: 25.0000 mg | ORAL_TABLET | Freq: Every day | ORAL | 0 refills | Status: DC

## 2017-11-30 NOTE — ED Provider Notes (Signed)
Emergency Department Provider Note   I have reviewed the triage vital signs and the nursing notes.   HISTORY  Chief Complaint Allergic Reaction   HPI Edward Farmer is a 57 y.o. male who apparently works as a Administratorlandscaper and started having full body itching on Wednesday.  Over the next couple days a progressively worsening itching and then swelling as well.  Started have some swelling in his hands but then progressed to his mouth but seems to be worse in the right side of his mouth as well. No other symptoms. No n/v or difficulty swallowing or breathing. No other issues.  No other associated or modifying symptoms.    Past Medical History:  Diagnosis Date  . Asthma   . Bradycardia 07/14/2016   on BB. Rate improved with discontinuation.   . Hypertension   . Tobacco abuse     Patient Active Problem List   Diagnosis Date Noted  . Pain at surgical incision 09/03/2016  . Chest pain at rest 08/05/2016  . Smoker 08/05/2016  . Hx of CABG 07/17/2016  . Family history of premature CAD 07/16/2016  . Unstable angina (HCC) 07/14/2016  . AKI (acute kidney injury) (HCC) 07/14/2016  . Chronic hepatitis C without hepatic coma (HCC) 01/27/2014  . Asthma, chronic 01/27/2014  . Essential hypertension, benign 01/27/2014  . Obesity, unspecified 01/27/2014    Past Surgical History:  Procedure Laterality Date  . CORONARY ARTERY BYPASS GRAFT N/A 07/17/2016   Procedure: CORONARY ARTERY BYPASS GRAFTING (CABG) x 1. OFF PUMP. LIMA TO LAD.;  Surgeon: Delight OvensEdward B [name], MD;  [facility];  Service: Open Heart Surgery;  Laterality: N/A;  . LEFT HEART CATH AND CORONARY ANGIOGRAPHY N/A 07/16/2016   Procedure: Left Heart Cath and Coronary Angiography;  Surgeon: Marykay Lexavid W Harding, MD;  [facility];  Service: Cardiovascular;  Laterality: N/A;  . TEE WITHOUT CARDIOVERSION N/A 07/17/2016   Procedure: TRANSESOPHAGEAL ECHOCARDIOGRAM (TEE);  Surgeon: Delight OvensEdward B [name], MD;  [facility];   Service: Open Heart Surgery;  Laterality: N/A;    Current Outpatient Rx  . Order #: [id number] Class: No Print  . Order #: [id number] Class: Print  . Order #: [id number] Class: Historical Med  . Order #: [id number] Class: Historical Med  . Order #: [id number] Class: Normal  . Order #: [id number] Class: Normal  . Order #: [id number] Class: Historical Med  . Order #: [id number] Class: Print    Allergies Patient has no active allergies.  Family History  Problem Relation Age of Onset  . Migraines Mother   . Heart attack Father     Social History Social History   Tobacco Use  . Smoking status: Current Every Day Smoker    Packs/day: 0.25    Types: Cigarettes  . Smokeless tobacco: Never Used  . Tobacco comment: cutting back  Substance Use Topics  . Alcohol use: Yes    Alcohol/week: 0.0 oz    Comment: occ  . Drug use: No    Review of Systems  All other systems negative except as documented in the HPI. All pertinent positives and negatives as reviewed in the HPI. ____________________________________________   PHYSICAL EXAM:  VITAL SIGNS: ED Triage Vitals [11/30/17 1518]  Enc Vitals Group     BP (!) 163/106     Pulse Rate 80     Resp 18     Temp 98.3 F (36.8 C)     Temp Source Oral     SpO2 96 %     Weight 200 lb (  90.7 kg)     Height 5\' 9"  (1.753 m)    Constitutional: Alert and oriented. Well appearing and in no acute distress. Eyes: Conjunctivae are normal. PERRL. EOMI. Head: Atraumatic. Nose: No congestion/rhinnorhea. Mouth/Throat: Mucous membranes are moist.  Oropharynx non-erythematous. Neck: No stridor.  No meningeal signs.   Cardiovascular: Normal rate, regular rhythm. Good peripheral circulation. Grossly normal heart sounds.   Respiratory: Normal respiratory effort.  No retractions. Lungs bilateral crackles in bases. Gastrointestinal: Soft and nontender. No distention.  Musculoskeletal: No lower extremity tenderness nor edema. No gross deformities of  extremities. Neurologic:  Normal speech and language. No gross focal neurologic deficits are appreciated.  Skin:  Skin is warm, dry and intact. Urticaria in trunk, arms and legs. Separate eczema appearing rash on chest and upper back. Excoriations diffusely.   ____________________________________________   LABS (all labs ordered are listed, but only abnormal results are displayed)  Labs Reviewed  COMPREHENSIVE METABOLIC PANEL - Abnormal; Notable for the following components:      Result Value   Calcium 8.8 (*)    Albumin 3.1 (*)    All other components within normal limits  CBC WITH DIFFERENTIAL/PLATELET  BRAIN NATRIURETIC PEPTIDE  TROPONIN I   ____________________________________________  EKG   EKG Interpretation  Date/Time:  Sunday November 30 2017 16:26:23 EDT Ventricular Rate:  71 PR Interval:    QRS Duration: 112 QT Interval:  404 QTC Calculation: 439 R Axis:   31 Text Interpretation:  Sinus rhythm Probable left atrial enlargement Borderline intraventricular conduction delay Abnormal T, consider ischemia, diffuse leads Baseline wander in lead(s) V4 V5 ST changes all similar to previous ECG's.  No acute changes Confirmed by Marily Memos [phone]) on 11/30/2017 4:31:41 PM Also confirmed by Marily Memos [phone]), editor Barbette Hair [phone])  on 11/30/2017 4:32:32 PM       ____________________________________________  RADIOLOGY  Dg Chest 2 View  Result Date: 11/30/2017 CLINICAL DATA:  Allergic reaction. EXAM: CHEST - 2 VIEW COMPARISON:  01/28/2017 FINDINGS: The heart is mildly enlarged but stable. Stable surgical changes from bypass surgery. There is tortuosity and mild calcification of the thoracic aorta. The lungs are clear of an acute process. No edema, infiltrates or effusions. No pneumothorax. The bony thorax is intact. IMPRESSION: No acute cardiopulmonary findings.  Mild stable cardiac enlargement. Electronically Signed   By: Rudie Meyer M.D.   On: 11/30/2017 17:02     ____________________________________________   INITIAL IMPRESSION / ASSESSMENT AND PLAN / ED COURSE  Patient does appear to have hives but also has lip swelling.  Could be related to lisinopril or could be allergic reaction of some sort.  Will treat allergic reaction and have him stop his lisinopril.  He does have some crackles in bilateral lower bases and with his coronary artery disease history we will check couple labs to make sure that he is not fluid overloaded.  Still with angioedema, itching/rash improved. Doubt anaphylaxis. Will treat appropriately.    Pertinent labs & imaging results that were available during my care of the patient were reviewed by me and considered in my medical decision making (see chart for details).  ____________________________________________  FINAL CLINICAL IMPRESSION(S) / ED DIAGNOSES  Final diagnoses:  Angioedema, initial encounter     MEDICATIONS GIVEN DURING THIS VISIT:  Medications  predniSONE (DELTASONE) tablet 60 mg (60 mg Oral Given 11/30/17 1619)  diphenhydrAMINE (BENADRYL) capsule 25 mg (25 mg Oral Given 11/30/17 1619)  famotidine (PEPCID) tablet 20 mg (20 mg Oral Given 11/30/17 1619)  diphenhydrAMINE (  BENADRYL) injection 25 mg (25 mg Intravenous Given 11/30/17 1702)     NEW OUTPATIENT MEDICATIONS STARTED DURING THIS VISIT:  New Prescriptions   DIPHENHYDRAMINE (BENADRYL) 25 MG TABLET    Take 1-2 tablets (25-50 mg total) by mouth every 6 (six) hours.   HYDROCHLOROTHIAZIDE (HYDRODIURIL) 25 MG TABLET    Take 1 tablet (25 mg total) by mouth daily.   PREDNISONE (DELTASONE) 20 MG TABLET    3 tabs po daily x 3 days, then 2 tabs x 3 days, then 1.5 tabs x 3 days, then 1 tab x 3 days, then 0.5 tabs x 3 days    Note:  This note was prepared with assistance of Dragon voice recognition software. Occasional wrong-word or sound-a-like substitutions may have occurred due to the inherent limitations of voice recognition software.   Marily Memos,  MD 11/30/17 [phone]

## 2017-11-30 NOTE — ED Triage Notes (Signed)
Pt states he is having an allergic rxn to unknown source. States itching started Thursday on back and has now spread to entire body. Pt states swelling to face/lip started today. Pt does take Lisinopril.

## 2018-03-12 ENCOUNTER — Ambulatory Visit: Payer: Medicaid Other | Attending: Internal Medicine | Admitting: Internal Medicine

## 2018-03-12 ENCOUNTER — Encounter: Payer: Self-pay | Admitting: Internal Medicine

## 2018-03-12 VITALS — BP 189/129 | HR 75 | Temp 97.7°F | Resp 16 | Ht 70.0 in | Wt 205.0 lb

## 2018-03-12 DIAGNOSIS — Z951 Presence of aortocoronary bypass graft: Secondary | ICD-10-CM | POA: Diagnosis not present

## 2018-03-12 DIAGNOSIS — Z8249 Family history of ischemic heart disease and other diseases of the circulatory system: Secondary | ICD-10-CM | POA: Insufficient documentation

## 2018-03-12 DIAGNOSIS — I25718 Atherosclerosis of autologous vein coronary artery bypass graft(s) with other forms of angina pectoris: Secondary | ICD-10-CM

## 2018-03-12 DIAGNOSIS — E669 Obesity, unspecified: Secondary | ICD-10-CM | POA: Insufficient documentation

## 2018-03-12 DIAGNOSIS — Z791 Long term (current) use of non-steroidal anti-inflammatories (NSAID): Secondary | ICD-10-CM | POA: Insufficient documentation

## 2018-03-12 DIAGNOSIS — F17200 Nicotine dependence, unspecified, uncomplicated: Secondary | ICD-10-CM

## 2018-03-12 DIAGNOSIS — F17290 Nicotine dependence, other tobacco product, uncomplicated: Secondary | ICD-10-CM | POA: Insufficient documentation

## 2018-03-12 DIAGNOSIS — Z7982 Long term (current) use of aspirin: Secondary | ICD-10-CM | POA: Insufficient documentation

## 2018-03-12 DIAGNOSIS — B182 Chronic viral hepatitis C: Secondary | ICD-10-CM | POA: Diagnosis not present

## 2018-03-12 DIAGNOSIS — Z2821 Immunization not carried out because of patient refusal: Secondary | ICD-10-CM | POA: Diagnosis not present

## 2018-03-12 DIAGNOSIS — Z8619 Personal history of other infectious and parasitic diseases: Secondary | ICD-10-CM | POA: Diagnosis not present

## 2018-03-12 DIAGNOSIS — Z23 Encounter for immunization: Secondary | ICD-10-CM | POA: Insufficient documentation

## 2018-03-12 DIAGNOSIS — I10 Essential (primary) hypertension: Secondary | ICD-10-CM | POA: Diagnosis not present

## 2018-03-12 DIAGNOSIS — Z6829 Body mass index (BMI) 29.0-29.9, adult: Secondary | ICD-10-CM | POA: Diagnosis not present

## 2018-03-12 MED ORDER — DICLOFENAC SODIUM 1 % TD GEL: 2.0000 g | Freq: Two times a day (BID) | TRANSDERMAL | 1 refills | Status: DC | PRN

## 2018-03-12 MED ORDER — HYDROCHLOROTHIAZIDE 25 MG PO TABS: 25.0000 mg | ORAL_TABLET | Freq: Every day | ORAL | 3 refills | Status: DC

## 2018-03-12 MED ORDER — ATORVASTATIN CALCIUM 20 MG PO TABS: 20.0000 mg | ORAL_TABLET | Freq: Every day | ORAL | 3 refills | Status: DC

## 2018-03-12 MED ORDER — NICOTINE 14 MG/24HR TD PT24: 14.0000 mg | MEDICATED_PATCH | Freq: Every day | TRANSDERMAL | 1 refills | Status: DC

## 2018-03-12 MED ORDER — CARVEDILOL 25 MG PO TABS: 25.0000 mg | ORAL_TABLET | Freq: Two times a day (BID) | ORAL | 3 refills | Status: DC

## 2018-03-12 MED FILL — ATORVASTATIN 20 MG TABLET: 20 | 30 days supply | Qty: 30 | Fill #0

## 2018-03-12 MED FILL — DICLOFENAC SODIUM 1% GEL: 1 | 25 days supply | Qty: 100 | Fill #0

## 2018-03-12 MED FILL — HYDROCHLOROTHIAZIDE 25 MG T: 25 | 30 days supply | Qty: 30 | Fill #0

## 2018-03-12 MED FILL — NICOTINE 14 MG/24HR PATCH: 14 | 28 days supply | Qty: 28 | Fill #0

## 2018-03-12 MED FILL — CARVEDILOL 25 MG TABLET: 25 | 30 days supply | Qty: 60 | Fill #0

## 2018-03-12 NOTE — Patient Instructions (Signed)
Stop metoprolol.  Start carvedilol instead.  I have refilled your blood pressure medicine called HCTZ.  I recommend that you discontinue vaping.  I have sent a prescription to the pharmacy for the nicotine patches as discussed today.  You have been referred to cardiology.

## 2018-03-12 NOTE — Progress Notes (Signed)
Patient ID: Edward Farmer, male    DOB: July 06, 1960  MRN: [id number]  CC: New Patient (Initial Visit) and Hypertension   Subjective: Edward Farmer is a 57 y.o. male who presents for new patient visit. His concerns today include:  Patient with history of CAD S/P CABG, HTN, tobacco dependence, chronic hep C  Previous PCP was Edward Farmer at [facility].  Last seen 3-4 mths ago.  Pt states he was "cut loose" after he got Medicaid.  Lives at homeless shelter.    Hep C:  Treated 3 yrs ago with Harvoni.  States he was told that he was cured. He drinks beers occasionally if he is watching a ball game.  Previous history of marijuana use but states that he no longer uses.  Denies any other street drug use.  CAD/HTN:  Last saw cardiology sometime last yr. Currently  on ASA, Metoprolol and HCTZ.  Out of the latter for a few wks. Endorses SOB on exertion.  No CP/LE edema/HA.  Dizziness sometimes -tries to limit salt in the foods -he no longer smokes cigarettes (quit 6 mths ago) but now vapes. Never tried Chantix, Buproprion or patches.   Takes Ibuprofen 2-3 x a day for various body aches including for chest tightness.  "Sometimes I get pain in my muscles in my arms."  HM:  Declines flu shot and Tdap.  Looks like he got only 1 hep A vaccine; not sure if he received hep B vaccine Patient Active Problem List   Diagnosis Date Noted  . Pain at surgical incision 09/03/2016  . Chest pain at rest 08/05/2016  . Smoker 08/05/2016  . Hx of CABG 07/17/2016  . Family history of premature CAD 07/16/2016  . Unstable angina (HCC) 07/14/2016  . AKI (acute kidney injury) (HCC) 07/14/2016  . Chronic hepatitis C without hepatic coma (HCC) 01/27/2014  . Asthma, chronic 01/27/2014  . Essential hypertension, benign 01/27/2014  . Obesity, unspecified 01/27/2014     Current Outpatient Medications on File Prior to Visit  Medication Sig Dispense Refill  . aspirin EC 325 MG EC tablet Take 1 tablet (325  mg total) by mouth daily. 30 tablet 0  . ibuprofen (ADVIL,MOTRIN) 200 MG tablet Take 800 mg by mouth every 6 (six) hours as needed for moderate pain.     No current facility-administered medications on file prior to visit.     No Known Allergies  Social History   Socioeconomic History  . Marital status: Legally Separated    Spouse name: Not on file  . Number of children: Not on file  . Years of education: Not on file  . Highest education level: Not on file  Occupational History  . Not on file  Social Needs  . Financial resource strain: Not on file  . Food insecurity:    Worry: Not on file    Inability: Not on file  . Transportation needs:    Medical: Not on file    Non-medical: Not on file  Tobacco Use  . Smoking status: Current Every Day Smoker    Packs/day: 0.25    Types: Cigarettes  . Smokeless tobacco: Never Used  . Tobacco comment: cutting back  Substance and Sexual Activity  . Alcohol use: Yes    Alcohol/week: 0.0 standard drinks    Comment: occ  . Drug use: No  . Sexual activity: Not on file  Lifestyle  . Physical activity:    Days per week: Not on file  Minutes per session: Not on file  . Stress: Not on file  Relationships  . Social connections:    Talks on phone: Not on file    Gets together: Not on file    Attends religious service: Not on file    Active member of club or organization: Not on file    Attends meetings of clubs or organizations: Not on file    Relationship status: Not on file  . Intimate partner violence:    Fear of current or ex partner: Not on file    Emotionally abused: Not on file    Physically abused: Not on file    Forced sexual activity: Not on file  Other Topics Concern  . Not on file  Social History Narrative  . Not on file    Family History  Problem Relation Age of Onset  . Migraines Mother   . Heart attack Father     Past Surgical History:  Procedure Laterality Date  . CORONARY ARTERY BYPASS GRAFT N/A  07/17/2016   Procedure: CORONARY ARTERY BYPASS GRAFTING (CABG) x 1. OFF PUMP. LIMA TO LAD.;  Surgeon: Delight Ovens, MD;  Location: [facility] OR;  Service: Open Heart Surgery;  Laterality: N/A;  . LEFT HEART CATH AND CORONARY ANGIOGRAPHY N/A 07/16/2016   Procedure: Left Heart Cath and Coronary Angiography;  Surgeon: Marykay Lex, MD;  Location: [facility] INVASIVE CV LAB;  Service: Cardiovascular;  Laterality: N/A;  . TEE WITHOUT CARDIOVERSION N/A 07/17/2016   Procedure: TRANSESOPHAGEAL ECHOCARDIOGRAM (TEE);  Surgeon: Delight Ovens, MD;  Location: [facility] OR;  Service: Open Heart Surgery;  Laterality: N/A;    ROS: Review of Systems Negative except as above PHYSICAL EXAM: BP (!) 189/129 (BP Location: Right Arm, Cuff Size: Large) Comment: recheck  Pulse 75   Temp 97.7 F (36.5 C) (Oral)   Resp 16   Ht 5\' 10"  (1.778 m)   Wt 205 lb (93 kg)   SpO2 97%   BMI 29.41 kg/m   Physical Exam  General appearance - alert, well appearing, middle age AAM and in no distress Mental status - normal mood, behavior, speech, dress, motor activity, and thought processes Eyes - pupils equal and reactive, extraocular eye movements intact Nose - normal and patent, no erythema, discharge or polyps Mouth - mucous membranes moist, pharynx normal without lesions Neck - supple, no significant adenopathy Lymphatics -no cervical or axillary LN Chest - clear to auscultation, no wheezes, rales or rhonchi, symmetric air entry Heart - normal rate, regular rhythm, normal S1, S2, no murmurs, rubs, clicks or gallops Abdomen - soft, nontender, nondistended, no masses or organomegaly Extremities - peripheral pulses normal, no pedal edema, no clubbing or cyanosis    Chemistry      Component Value Date/Time   NA 141 11/30/2017 1621   K 4.5 11/30/2017 1621   CL 111 11/30/2017 1621   CO2 23 11/30/2017 1621   BUN 15 11/30/2017 1621   CREATININE 1.19 11/30/2017 1621   CREATININE 0.99 05/18/2014 1516      Component Value Date/Time    CALCIUM 8.8 (L) 11/30/2017 1621   ALKPHOS 87 11/30/2017 1621   AST 23 11/30/2017 1621   ALT 19 11/30/2017 1621   BILITOT 0.9 11/30/2017 1621     Lab Results  Component Value Date   WBC 8.1 11/30/2017   HGB 13.7 11/30/2017   HCT 44.2 11/30/2017   MCV 88.4 11/30/2017   PLT 251 11/30/2017    ASSESSMENT AND PLAN:  1. 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  I recommend change metoprolol to carvedilol.  Continue aspirin.  Add statin. Will arrange follow-up with cardiology.  Chest tightness that he gets with exertion may be stable angina. - carvedilol (COREG) 25 MG tablet; Take 1 tablet (25 mg total) by mouth 2 (two) times daily with a meal.  Dispense: 180 tablet; Refill: 3 - hydrochlorothiazide (HYDRODIURIL) 25 MG tablet; Take 1 tablet (25 mg total) by mouth daily.  Dispense: 90 tablet; Refill: 3 - atorvastatin (LIPITOR) 20 MG tablet; Take 1 tablet (20 mg total) by mouth daily.  Dispense: 90 tablet; Refill: 3 - Ambulatory referral to Cardiology - Lipid panel  2. Essential hypertension Not at goal.  See #1 above - carvedilol (COREG) 25 MG tablet; Take 1 tablet (25 mg total) by mouth 2 (two) times daily with a meal.  Dispense: 180 tablet; Refill: 3 - hydrochlorothiazide (HYDRODIURIL) 25 MG tablet; Take 1 tablet (25 mg total) by mouth daily.  Dispense: 90 tablet; Refill: 3  3. Tobacco dependence Commended him on quitting cigarettes.  However I also advised against vaping due to new clinical information that suggest vaping may cause acute lung injury.  We discussed health risks associated with smoking.  He is willing to try nicotine patches. Less than 5 minutes spent on counseling - nicotine (NICODERM CQ - DOSED IN MG/24 HOURS) 14 mg/24hr patch; Place 1 patch (14 mg total) onto the skin daily.  Dispense: 28 patch; Refill: 1  4. History of hepatitis C We will give the second shot of hep a  vaccine.  We will check to see if he has maintained SVR having completed Harvoni 3 years ago.  We will also check immunization status for hepatitis B - Hepatitis A vaccine adult IM - Hepatic Function Panel - Hepatitis c vrs RNA detect by PCR-qual - Hepatitis B surface antibody,qualitative - Hepatitis B surface antigen  5. Influenza vaccination declined   6. Tetanus, diphtheria, and acellular pertussis (Tdap) vaccination declined   Patient was given the opportunity to ask questions.  Patient verbalized understanding of the plan and was able to repeat key elements of the plan.   Orders Placed This Encounter  Procedures  . Hepatitis A vaccine adult IM  . Lipid panel  . Hepatic Function Panel  . Hepatitis c vrs RNA detect by PCR-qual  . Hepatitis B surface antibody,qualitative  . Hepatitis B surface antigen  . Ambulatory referral to Cardiology     Requested Prescriptions   Signed Prescriptions Disp Refills  . carvedilol (COREG) 25 MG tablet 180 tablet 3    Sig: Take 1 tablet (25 mg total) by mouth 2 (two) times daily with a meal.  . hydrochlorothiazide (HYDRODIURIL) 25 MG tablet 90 tablet 3    Sig: Take 1 tablet (25 mg total) by mouth daily.  . nicotine (NICODERM CQ - DOSED IN MG/24 HOURS) 14 mg/24hr patch 28 patch 1    Sig: Place 1 patch (14 mg total) onto the skin daily.  [name] atorvastatin (LIPITOR) 20 MG tablet 90 tablet 3    Sig: Take 1 tablet (20 mg total) by mouth daily.  . diclofenac sodium (VOLTAREN) 1 % GEL 100 g 1    Sig: Apply 2 g topically 2 (two) times daily as needed.    Return in about 1 month (around 04/11/2018) for for BP check.  Jonah Blue, MD, FACP

## 2018-03-14 LAB — HEPATIC FUNCTION PANEL
ALK PHOS: 91 IU/L (ref 39–117)
ALT: 23 IU/L (ref 0–44)
AST: 14 IU/L (ref 0–40)
Albumin: 4 g/dL (ref 3.5–5.5)
BILIRUBIN TOTAL: 0.4 mg/dL (ref 0.0–1.2)
Bilirubin, Direct: 0.1 mg/dL (ref 0.00–0.40)
Total Protein: 7.1 g/dL (ref 6.0–8.5)

## 2018-03-14 LAB — HEPATITIS C VRS RNA DETECT BY PCR-QUAL: HCV RNA NAA QUALITATIVE: NEGATIVE

## 2018-03-14 LAB — LIPID PANEL
Chol/HDL Ratio: 4.5 ratio (ref 0.0–5.0)
Cholesterol, Total: 177 mg/dL (ref 100–199)
HDL: 39 mg/dL — AB (ref >39)
LDL Calculated: 116 mg/dL — ABNORMAL HIGH (ref 0–99)
TRIGLYCERIDES: 108 mg/dL (ref 0–149)
VLDL Cholesterol Cal: 22 mg/dL (ref 5–40)

## 2018-03-14 LAB — HEPATITIS B SURFACE ANTIBODY,QUALITATIVE: Hep B Surface Ab, Qual: REACTIVE

## 2018-03-14 LAB — HEPATITIS B SURFACE ANTIGEN: Hepatitis B Surface Ag: NEGATIVE

## 2018-04-07 ENCOUNTER — Ambulatory Visit: Payer: Medicaid Other | Admitting: Internal Medicine

## 2018-05-05 ENCOUNTER — Ambulatory Visit: Payer: Medicaid Other | Admitting: Internal Medicine

## 2018-05-31 NOTE — Progress Notes (Deleted)
   Subjective:    Patient ID: Edward Farmer, male    DOB: [birth year], 59 y.o.   MRN: [id number]  58 y.o.M with HTN here for f/u . Last OV with Dr Laural Benes 03/2018.  Needs colon cancer screening     Review of Systems     Objective:   Physical Exam        Assessment & Plan:

## 2018-06-01 ENCOUNTER — Ambulatory Visit: Payer: Medicaid Other | Admitting: Critical Care Medicine

## 2018-11-17 ENCOUNTER — Telehealth: Payer: Self-pay | Admitting: Cardiology

## 2018-11-17 NOTE — Telephone Encounter (Signed)
Lm about recall °

## 2018-12-01 ENCOUNTER — Telehealth: Payer: Self-pay | Admitting: Cardiology

## 2018-12-01 NOTE — Telephone Encounter (Signed)
LVM for patient to call and schedule appointment with Kerin Ransom.

## 2019-09-15 ENCOUNTER — Encounter: Payer: Self-pay | Admitting: Critical Care Medicine

## 2019-09-15 ENCOUNTER — Other Ambulatory Visit: Payer: Self-pay | Admitting: Critical Care Medicine

## 2019-09-15 MED ORDER — TERBINAFINE HCL 250 MG PO TABS: 250.0000 mg | ORAL_TABLET | Freq: Every day | ORAL | 0 refills | Status: DC

## 2019-09-15 NOTE — Progress Notes (Signed)
Refill terbinafine

## 2019-09-17 NOTE — Progress Notes (Signed)
Patient ID: Edward Farmer, male   DOB: [birth year], 59 y.o.   MRN: [id number] This is a 59 year old male resident of the [facility] recently arrived now has Medicaid was formally seeing a nurse practitioner I received a can no longer go there because he now has insurance.  The patient would like a prescription for a medication to control his toenail fungus  On exam he has thickened toenails particular great toes of both feet with toenail fungus  Plan will be to prescribe terbinafine 250 mg daily for 14 days and he will follow-up in the clinic

## 2019-09-22 ENCOUNTER — Encounter: Payer: Self-pay | Admitting: Critical Care Medicine

## 2019-09-22 ENCOUNTER — Other Ambulatory Visit: Payer: Self-pay | Admitting: Critical Care Medicine

## 2019-09-22 DIAGNOSIS — F17200 Nicotine dependence, unspecified, uncomplicated: Secondary | ICD-10-CM

## 2019-09-22 DIAGNOSIS — B351 Tinea unguium: Secondary | ICD-10-CM

## 2019-09-22 MED ORDER — NICOTINE 14 MG/24HR TD PT24: 14.0000 mg | MEDICATED_PATCH | Freq: Every day | TRANSDERMAL | 1 refills | Status: DC

## 2019-09-22 MED ORDER — TERBINAFINE HCL 250 MG PO TABS: 250.0000 mg | ORAL_TABLET | Freq: Every day | ORAL | 0 refills | Status: DC

## 2019-09-22 NOTE — Progress Notes (Signed)
Patient ID: Edward Farmer, male   DOB: 03/29/1961, 59 y.o.   MRN: [id number] This patient was seen today in the shelter clinic in follow-up.  He saw him last week for significant onychomycosis of the toenails.  Unfortunately his terbinafine we called in did not reach the pharmacy.  I plan to reorder the terbinafine to his local pharmacy and we will also schedule a visit with him for podiatry as well as to see me in the primary care office

## 2019-09-22 NOTE — Progress Notes (Signed)
Refill and referral foot MD

## 2019-09-23 DIAGNOSIS — Z03818 Encounter for observation for suspected exposure to other biological agents ruled out: Secondary | ICD-10-CM | POA: Diagnosis not present

## 2019-09-23 NOTE — Congregational Nurse Program (Signed)
Edward Farmer was seen for fungus to toe nails. Referral made to [facility], and clinic will call with appointment date and time

## 2019-10-04 DIAGNOSIS — Z03818 Encounter for observation for suspected exposure to other biological agents ruled out: Secondary | ICD-10-CM | POA: Diagnosis not present

## 2019-10-13 ENCOUNTER — Ambulatory Visit: Payer: Self-pay | Admitting: Podiatry

## 2019-10-18 DIAGNOSIS — Z03818 Encounter for observation for suspected exposure to other biological agents ruled out: Secondary | ICD-10-CM | POA: Diagnosis not present

## 2019-11-02 DIAGNOSIS — Z03818 Encounter for observation for suspected exposure to other biological agents ruled out: Secondary | ICD-10-CM | POA: Diagnosis not present

## 2019-11-10 ENCOUNTER — Other Ambulatory Visit: Payer: Self-pay | Admitting: Critical Care Medicine

## 2019-11-10 ENCOUNTER — Encounter: Payer: Self-pay | Admitting: Critical Care Medicine

## 2019-11-10 MED ORDER — CETAPHIL MOISTURIZING EX CREA: TOPICAL_CREAM | CUTANEOUS | 0 refills | Status: DC | PRN

## 2019-11-10 MED FILL — CETAPHIL MOISTURIZING CREA: 5 days supply | Qty: 60 | Fill #0

## 2019-11-10 NOTE — Progress Notes (Signed)
Skin moisturizer

## 2019-11-10 NOTE — Progress Notes (Signed)
Patient ID: Edward Farmer, male   DOB: 07/01/1960, 59 y.o.   MRN: [id number] This pt was seen in shelter clinic.  He wants refills on skin moistuizer Dermasil  I offered cetaphil moisturizer

## 2019-11-24 ENCOUNTER — Encounter: Payer: Self-pay | Admitting: Critical Care Medicine

## 2019-11-25 NOTE — Progress Notes (Signed)
This patient is seen today in the [facility] clinic and has a history of hypertension today's blood pressure is 150/90 and he just had the [employer] vaccine  Plan will be to get this patient an appointment in the health and wellness center and we will have him continue his current blood pressure medications and assess him further there at that time.  I gave him an over-the-counter sample of Advil Tylenol combination in case he gets soreness from the vaccine

## 2019-12-15 ENCOUNTER — Other Ambulatory Visit: Payer: Self-pay | Admitting: Critical Care Medicine

## 2019-12-15 ENCOUNTER — Encounter: Payer: Self-pay | Admitting: Critical Care Medicine

## 2019-12-15 DIAGNOSIS — I10 Essential (primary) hypertension: Secondary | ICD-10-CM

## 2019-12-15 DIAGNOSIS — I25718 Atherosclerosis of autologous vein coronary artery bypass graft(s) with other forms of angina pectoris: Secondary | ICD-10-CM

## 2019-12-15 MED ORDER — CARVEDILOL 25 MG PO TABS: 25.0000 mg | ORAL_TABLET | Freq: Two times a day (BID) | ORAL | 3 refills | Status: DC

## 2019-12-15 MED ORDER — ATORVASTATIN CALCIUM 20 MG PO TABS: 20.0000 mg | ORAL_TABLET | Freq: Every day | ORAL | 3 refills | Status: DC

## 2019-12-15 MED ORDER — AMLODIPINE BESYLATE 10 MG PO TABS: 10.0000 mg | ORAL_TABLET | Freq: Every day | ORAL | 3 refills | Status: DC

## 2019-12-15 MED ORDER — HYDROCHLOROTHIAZIDE 25 MG PO TABS
25.0000 mg | ORAL_TABLET | Freq: Every day | ORAL | 3 refills | Status: DC
Start: 2019-12-15 — End: 2020-05-29

## 2019-12-16 MED FILL — HYDROCHLOROTHIAZIDE 25 MG T: 25 | 30 days supply | Qty: 30 | Fill #0

## 2019-12-16 MED FILL — CARVEDILOL 25 MG TABLET: 25 | 30 days supply | Qty: 60 | Fill #0

## 2019-12-16 MED FILL — AMLODIPINE BESYLATE 10 MG T: 10 | 30 days supply | Qty: 30 | Fill #0

## 2019-12-16 MED FILL — ATORVASTATIN CALCIUM 20 MG: 20 | 30 days supply | Qty: 30 | Fill #0

## 2019-12-16 NOTE — Progress Notes (Signed)
Patient ID: Edward Farmer, male   DOB: Sep 27, 1960, 59 y.o.   MRN: [id number] This patient is seen today in the [facility] clinic note blood pressure 145/100 he is wishing a cream for his itchiness on his arms and does need to establish in the [facility] health and wellness clinic for further follow-up of his hypertension  I do have an appointment scheduled for him on August 16 he will see me at that time

## 2019-12-23 ENCOUNTER — Encounter: Payer: Self-pay | Admitting: Critical Care Medicine

## 2019-12-24 NOTE — Progress Notes (Signed)
Patient ID: Edward Farmer, male   DOB: December 08, 1960, 59 y.o.   MRN: [id number] This is a 59 year old male at the [facility] wishing blood pressure check today on exam blood pressure is 115/70 which is markedly better from the 145/100 a week ago.  The patient is maintaining amlodipine 10 mg daily hydrocal thiazide 25 mg daily along with Coreg 25 mg twice daily  I have an appointment with this patient August 25 in the community health and wellness clinic where he will be followed up and labs will be obtained

## 2019-12-29 ENCOUNTER — Other Ambulatory Visit: Payer: Self-pay

## 2019-12-29 ENCOUNTER — Encounter: Payer: Self-pay | Admitting: Critical Care Medicine

## 2019-12-29 ENCOUNTER — Other Ambulatory Visit: Payer: Self-pay | Admitting: Critical Care Medicine

## 2019-12-29 ENCOUNTER — Ambulatory Visit: Payer: Medicaid Other | Attending: Critical Care Medicine | Admitting: Critical Care Medicine

## 2019-12-29 VITALS — BP 116/79 | HR 72 | Temp 98.4°F | Resp 20 | Ht 69.0 in | Wt 219.0 lb

## 2019-12-29 DIAGNOSIS — F17200 Nicotine dependence, unspecified, uncomplicated: Secondary | ICD-10-CM

## 2019-12-29 DIAGNOSIS — Z951 Presence of aortocoronary bypass graft: Secondary | ICD-10-CM

## 2019-12-29 DIAGNOSIS — E6609 Other obesity due to excess calories: Secondary | ICD-10-CM

## 2019-12-29 DIAGNOSIS — Z7689 Persons encountering health services in other specified circumstances: Secondary | ICD-10-CM | POA: Diagnosis not present

## 2019-12-29 DIAGNOSIS — E7841 Elevated Lipoprotein(a): Secondary | ICD-10-CM | POA: Diagnosis not present

## 2019-12-29 DIAGNOSIS — I25718 Atherosclerosis of autologous vein coronary artery bypass graft(s) with other forms of angina pectoris: Secondary | ICD-10-CM | POA: Diagnosis not present

## 2019-12-29 DIAGNOSIS — Z87898 Personal history of other specified conditions: Secondary | ICD-10-CM | POA: Diagnosis not present

## 2019-12-29 DIAGNOSIS — R7303 Prediabetes: Secondary | ICD-10-CM | POA: Diagnosis not present

## 2019-12-29 DIAGNOSIS — Z6832 Body mass index (BMI) 32.0-32.9, adult: Secondary | ICD-10-CM

## 2019-12-29 DIAGNOSIS — I10 Essential (primary) hypertension: Secondary | ICD-10-CM | POA: Diagnosis not present

## 2019-12-29 DIAGNOSIS — Z1211 Encounter for screening for malignant neoplasm of colon: Secondary | ICD-10-CM

## 2019-12-29 DIAGNOSIS — J4520 Mild intermittent asthma, uncomplicated: Secondary | ICD-10-CM

## 2019-12-29 DIAGNOSIS — Z8249 Family history of ischemic heart disease and other diseases of the circulatory system: Secondary | ICD-10-CM

## 2019-12-29 MED ORDER — PROAIR RESPICLICK 108 (90 BASE) MCG/ACT IN AEPB: 2.0000 | INHALATION_SPRAY | Freq: Four times a day (QID) | RESPIRATORY_TRACT | 4 refills | Status: DC | PRN

## 2019-12-29 MED ORDER — TRIAMCINOLONE ACETONIDE 0.1 % EX CREA: 1.0000 "application " | TOPICAL_CREAM | Freq: Two times a day (BID) | CUTANEOUS | 0 refills | Status: DC

## 2019-12-29 MED ORDER — NICOTINE 14 MG/24HR TD PT24
14.0000 mg | MEDICATED_PATCH | Freq: Every day | TRANSDERMAL | 1 refills | Status: DC
Start: 2019-12-29 — End: 2020-05-29

## 2019-12-29 MED FILL — PROAIR RESPICLICK INHAL PWD: 108 (90 BAS | 25 days supply | Qty: 1 | Fill #0

## 2019-12-29 MED FILL — NICOTINE 14 MG/24HR PATCH: 14 | 28 days supply | Qty: 28 | Fill #0

## 2019-12-29 MED FILL — TRIAMCINOLONE 0.1% CREAM: 0.1 | 7 days supply | Qty: 30 | Fill #0

## 2019-12-29 NOTE — Assessment & Plan Note (Signed)
  .   Current smoking consumption amount: 3 to 5 cigarettes daily  . Dicsussion on advise to quit smoking and smoking impacts: Impacts on cardiovascular and lung health  . Patient's willingness to quit: Patient wishes to quit now  . Methods to quit smoking discussed: Behavioral modification and nicotine replacement therapy  . Medication management of smoking session drugs discussed: Recommending nicotine patch and the patient wishes a prescription  . Resources provided:  AVS   . Setting quit date quit date being set within the next month  . Follow-up arranged follow-up in the clinic in   Time spent counseling the patient:

## 2019-12-29 NOTE — Assessment & Plan Note (Signed)
Dietary recommendations were given for hypertension and also to lose weight

## 2019-12-29 NOTE — Progress Notes (Signed)
Subjective:    Patient ID: Edward Farmer, male    DOB: 1961/02/14, 59 y.o.   MRN: [id number]  12/29/2019 This is a pleasant black male who I follow in the [facility] comes in today to establish for primary care.  He is 59 years old has a history of hypertension, coronary artery disease status post bypass graft in 2018.   The patient was started on amlodipine 10 mg daily Coreg 25 mg twice daily and hydrochlorthiazide 25 mg daily at the shelter clinic.  Note there is a family history of premature coronary disease.  The patient still has active smoking at this time.  Patient comes in on arrival with blood pressure 116/80.  He has had no complaints.  He is doing well from a medical standpoint.  He does have a history of hepatitis C but was treated for this.  He is down to 3 cigarettes daily and wishes a refill on his nicotine patch.  He is committed to quitting smoking at this time.  Note the patient did have a Covid vaccine series already.  However he is declining a flu vaccine at this visit wishes to think about this.  He does wish to have an albuterol refill because he has history of asthma and the heat has been difficult for his breathing    The patient is on atorvastatin 20 mg daily and needs a follow-up of lipid panel  The patient will need cardiology follow-up as he is not seen cardiology in over 3 years  Past Medical History:  Diagnosis Date  . Asthma   . Bradycardia 07/14/2016   on BB. Rate improved with discontinuation.   . Hypertension   . Tobacco abuse      Family History  Problem Relation Age of Onset  . Migraines Mother   . Heart attack Father      Social History   Socioeconomic History  . Marital status: Legally Separated    Spouse name: Not on file  . Number of children: Not on file  . Years of education: Not on file  . Highest education level: Not on file  Occupational History  . Not on file  Tobacco Use  . Smoking status: Current Every Day  Smoker    Packs/day: 0.25    Types: Cigarettes  . Smokeless tobacco: Never Used  . Tobacco comment: cutting back  Substance and Sexual Activity  . Alcohol use: Yes    Alcohol/week: 0.0 standard drinks    Comment: occ  . Drug use: No  . Sexual activity: Not on file  Other Topics Concern  . Not on file  Social History Narrative  . Not on file   Social Determinants of Health   Financial Resource Strain:   . Difficulty of Paying Living Expenses: Not on file  Food Insecurity:   . Worried About Programme researcher, broadcasting/film/video in the Last Year: Not on file  . Ran Out of Food in the Last Year: Not on file  Transportation Needs:   . Lack of Transportation (Medical): Not on file  . Lack of Transportation (Non-Medical): Not on file  Physical Activity:   . Days of Exercise per Week: Not on file  . Minutes of Exercise per Session: Not on file  Stress:   . Feeling of Stress : Not on file  Social Connections:   . Frequency of Communication with Friends and Family: Not on file  . Frequency of Social Gatherings with Friends and Family:  Not on file  . Attends Religious Services: Not on file  . Active Member of Clubs or Organizations: Not on file  . Attends Banker Meetings: Not on file  . Marital Status: Not on file  Intimate Partner Violence:   . Fear of Current or Ex-Partner: Not on file  . Emotionally Abused: Not on file  . Physically Abused: Not on file  . Sexually Abused: Not on file     No Known Allergies   Outpatient Medications Prior to Visit  Medication Sig Dispense Refill  . amLODipine (NORVASC) 10 MG tablet Take 1 tablet (10 mg total) by mouth daily. 90 tablet 3  . atorvastatin (LIPITOR) 20 MG tablet Take 1 tablet (20 mg total) by mouth daily. 90 tablet 3  . carvedilol (COREG) 25 MG tablet Take 1 tablet (25 mg total) by mouth 2 (two) times daily with a meal. 180 tablet 3  . hydrochlorothiazide (HYDRODIURIL) 25 MG tablet Take 1 tablet (25 mg total) by mouth daily. 90  tablet 3  . aspirin EC 325 MG EC tablet Take 1 tablet (325 mg total) by mouth daily. (Patient not taking: Reported on 12/29/2019) 30 tablet 0  . diclofenac sodium (VOLTAREN) 1 % GEL Apply 2 g topically 2 (two) times daily as needed. (Patient not taking: Reported on 12/29/2019) 100 g 1  . Emollient (CETAPHIL) cream Apply topically as needed. (Patient not taking: Reported on 12/29/2019) 85 g 0  . ibuprofen (ADVIL,MOTRIN) 200 MG tablet Take 800 mg by mouth every 6 (six) hours as needed for moderate pain. (Patient not taking: Reported on 12/29/2019)    . nicotine (NICODERM CQ - DOSED IN MG/24 HOURS) 14 mg/24hr patch Place 1 patch (14 mg total) onto the skin daily. (Patient not taking: Reported on 12/29/2019) 28 patch 1  . terbinafine (LAMISIL) 250 MG tablet Take 1 tablet (250 mg total) by mouth daily. (Patient not taking: Reported on 12/29/2019) 14 tablet 0   No facility-administered medications prior to visit.     Review of Systems  Constitutional: Negative.   HENT: Negative.   Respiratory: Positive for shortness of breath and wheezing.   Endocrine: Negative.   Genitourinary: Negative.   Musculoskeletal: Negative.   Skin: Positive for color change. Negative for rash.  Neurological: Negative.   Hematological: Negative.   Psychiatric/Behavioral: Positive for dysphoric mood. Negative for agitation, behavioral problems, confusion, hallucinations, self-injury, sleep disturbance and suicidal ideas. The patient is not nervous/anxious and is not hyperactive.        Objective:   Physical Exam Vitals:   12/29/19 0848  BP: 116/79  Pulse: 72  Resp: 20  Temp: 98.4 F (36.9 C)  SpO2: 96%  Weight: 219 lb (99.3 kg)  Height: 5\' 9"  (1.753 m)    Gen: Pleasant, well-nourished, in no distress,  normal affect  ENT: No lesions,  mouth clear,  oropharynx clear, no postnasal drip  Neck: No JVD, no TMG, no carotid bruits  Lungs: No use of accessory muscles, no dullness to percussion, clear without rales  or rhonchi  Cardiovascular: RRR, heart sounds normal, no murmur or gallops, no peripheral edema  Abdomen: soft and NT, no HSM,  BS normal  Musculoskeletal: No deformities, no cyanosis or clubbing  Neuro: alert, non focal  Skin: Warm, no lesions or rashes  No results found.        Assessment & Plan:  I personally reviewed all images and lab data in the [facility] system as well as any outside material available during  this office visit and agree with the  radiology impressions.   Essential hypertension Essential hypertension under good control will refill current medications and not make further changes  We will obtain complete metabolic panel complete blood count and thyroid study  Asthma, chronic Asthma stable at this time we will refill the patient's albuterol  Obesity, unspecified Dietary recommendations were given for hypertension and also to lose weight  Tobacco dependence    . Current smoking consumption amount: 3 to 5 cigarettes daily  . Dicsussion on advise to quit smoking and smoking impacts: Impacts on cardiovascular and lung health  . Patient's willingness to quit: Patient wishes to quit now  . Methods to quit smoking discussed: Behavioral modification and nicotine replacement therapy  . Medication management of smoking session drugs discussed: Recommending nicotine patch and the patient wishes a prescription  . Resources provided:  AVS   . Setting quit date quit date being set within the next month  . Follow-up arranged follow-up in the clinic in   Time spent counseling the patient:      Barnie was seen today for establish care.  Diagnoses and all orders for this visit:  Encounter to establish care  Essential hypertension -     CBC with Differential -     Comprehensive metabolic panel -     Lipid Panel -     Thyroid Panel With TSH  Elevated lipoprotein(a) -     Lipid Panel  History of prediabetes -     Hemoglobin A1c  Colon cancer  screening -     Cancel: Ambulatory referral to Gastroenterology -     Cologuard  Tobacco dependence -     nicotine (NICODERM CQ - DOSED IN MG/24 HOURS) 14 mg/24hr patch; Place 1 patch (14 mg total) onto the skin daily.  Mild intermittent chronic asthma without complication  Class 1 obesity due to excess calories without serious comorbidity with body mass index (BMI) of 32.0 to 32.9 in adult  Other orders -     triamcinolone cream (KENALOG) 0.1 %; Apply 1 application topically 2 (two) times daily.

## 2019-12-29 NOTE — Assessment & Plan Note (Signed)
Essential hypertension under good control will refill current medications and not make further changes  We will obtain complete metabolic panel complete blood count and thyroid study

## 2019-12-29 NOTE — Patient Instructions (Addendum)
A cologuard test will be ordered to screen for colon cancer  YOu declined a flu vaccine  Labs today  Nicotine patch ordered for smoking cessation, see below  Steroid cream ordered for skin  No other medication changes  Great blood pressure today  Return Dr Delford Field 4 months   Coping with Quitting Smoking  Quitting smoking is a physical and mental challenge. You will face cravings, withdrawal symptoms, and temptation. Before quitting, work with your health care provider to make a plan that can help you cope. Preparation can help you quit and keep you from giving in. How can I cope with cravings? Cravings usually last for 5-10 minutes. If you get through it, the craving will pass. Consider taking the following actions to help you cope with cravings:  Keep your mouth busy: ? Chew sugar-free gum. ? Suck on hard candies or a straw. ? Brush your teeth.  Keep your hands and body busy: ? Immediately change to a different activity when you feel a craving. ? Squeeze or play with a ball. ? Do an activity or a hobby, like making bead jewelry, practicing needlepoint, or working with wood. ? Mix up your normal routine. ? Take a short exercise break. Go for a quick walk or run up and down stairs. ? Spend time in public places where smoking is not allowed.  Focus on doing something kind or helpful for someone else.  Call a friend or family member to talk during a craving.  Join a support group.  Call a quit line, such as 1-800-QUIT-NOW.  Talk with your health care provider about medicines that might help you cope with cravings and make quitting easier for you. How can I deal with withdrawal symptoms? Your body may experience negative effects as it tries to get used to not having nicotine in the system. These effects are called withdrawal symptoms. They may include:  Feeling hungrier than normal.  Trouble concentrating.  Irritability.  Trouble sleeping.  Feeling  depressed.  Restlessness and agitation.  Craving a cigarette. To manage withdrawal symptoms:  Avoid places, people, and activities that trigger your cravings.  Remember why you want to quit.  Get plenty of sleep.  Avoid coffee and other caffeinated drinks. These may worsen some of your symptoms. How can I handle social situations? Social situations can be difficult when you are quitting smoking, especially in the first few weeks. To manage this, you can:  Avoid parties, bars, and other social situations where people might be smoking.  Avoid alcohol.  Leave right away if you have the urge to smoke.  Explain to your family and friends that you are quitting smoking. Ask for understanding and support.  Plan activities with friends or family where smoking is not an option. What are some ways I can cope with stress? Wanting to smoke may cause stress, and stress can make you want to smoke. Find ways to manage your stress. Relaxation techniques can help. For example:  Breathe slowly and deeply, in through your nose and out through your mouth.  Listen to soothing, relaxing music.  Talk with a family member or friend about your stress.  Light a candle.  Soak in a bath or take a shower.  Think about a peaceful place. What are some ways I can prevent weight gain? Be aware that many people gain weight after they quit smoking. However, not everyone does. To keep from gaining weight, have a plan in place before you quit and stick to the  plan after you quit. Your plan should include:  Having healthy snacks. When you have a craving, it may help to: ? Eat plain popcorn, crunchy carrots, celery, or other cut vegetables. ? Chew sugar-free gum.  Changing how you eat: ? Eat small portion sizes at meals. ? Eat 4-6 small meals throughout the day instead of 1-2 large meals a day. ? Be mindful when you eat. Do not watch television or do other things that might distract you as you  eat.  Exercising regularly: ? Make time to exercise each day. If you do not have time for a long workout, do short bouts of exercise for 5-10 minutes several times a day. ? Do some form of strengthening exercise, like weight lifting, and some form of aerobic exercise, like running or swimming.  Drinking plenty of water or other low-calorie or no-calorie drinks. Drink 6-8 glasses of water daily, or as much as instructed by your health care provider. Summary  Quitting smoking is a physical and mental challenge. You will face cravings, withdrawal symptoms, and temptation to smoke again. Preparation can help you as you go through these challenges.  You can cope with cravings by keeping your mouth busy (such as by chewing gum), keeping your body and hands busy, and making calls to family, friends, or a helpline for people who want to quit smoking.  You can cope with withdrawal symptoms by avoiding places where people smoke, avoiding drinks with caffeine, and getting plenty of rest.  Ask your health care provider about the different ways to prevent weight gain, avoid stress, and handle social situations. This information is not intended to replace advice given to you by your health care provider. Make sure you discuss any questions you have with your health care provider. Document Revised: 04/04/2017 Document Reviewed: 04/19/2016 Elsevier Patient Education  2020 [employer].

## 2019-12-29 NOTE — Assessment & Plan Note (Signed)
Asthma stable at this time we will refill the patient's albuterol

## 2019-12-29 NOTE — Progress Notes (Signed)
Establish care

## 2019-12-30 ENCOUNTER — Telehealth: Payer: Self-pay | Admitting: Critical Care Medicine

## 2019-12-30 ENCOUNTER — Encounter: Payer: Self-pay | Admitting: Critical Care Medicine

## 2019-12-30 DIAGNOSIS — I25718 Atherosclerosis of autologous vein coronary artery bypass graft(s) with other forms of angina pectoris: Secondary | ICD-10-CM | POA: Insufficient documentation

## 2019-12-30 LAB — CBC WITH DIFFERENTIAL/PLATELET
Basophils Absolute: 0 10*3/uL (ref 0.0–0.2)
Basos: 0 %
EOS (ABSOLUTE): 0.2 10*3/uL (ref 0.0–0.4)
Eos: 2 %
Hematocrit: 43.5 % (ref 37.5–51.0)
Hemoglobin: 14.8 g/dL (ref 13.0–17.7)
Immature Grans (Abs): 0.1 10*3/uL (ref 0.0–0.1)
Immature Granulocytes: 1 %
Lymphocytes Absolute: 2.6 10*3/uL (ref 0.7–3.1)
Lymphs: 26 %
MCH: 29.4 pg (ref 26.6–33.0)
MCHC: 34 g/dL (ref 31.5–35.7)
MCV: 87 fL (ref 79–97)
Monocytes Absolute: 0.7 10*3/uL (ref 0.1–0.9)
Monocytes: 7 %
Neutrophils Absolute: 6.5 10*3/uL (ref 1.4–7.0)
Neutrophils: 64 %
Platelets: 224 10*3/uL (ref 150–450)
RBC: 5.03 x10E6/uL (ref 4.14–5.80)
RDW: 13.8 % (ref 11.6–15.4)
WBC: 10.1 10*3/uL (ref 3.4–10.8)

## 2019-12-30 LAB — COMPREHENSIVE METABOLIC PANEL
ALT: 29 IU/L (ref 0–44)
AST: 23 IU/L (ref 0–40)
Albumin/Globulin Ratio: 1.4 (ref 1.2–2.2)
Albumin: 4.1 g/dL (ref 3.8–4.9)
Alkaline Phosphatase: 94 IU/L (ref 48–121)
BUN/Creatinine Ratio: 15 (ref 10–24)
BUN: 15 mg/dL (ref 8–27)
Bilirubin Total: 0.5 mg/dL (ref 0.0–1.2)
CO2: 23 mmol/L (ref 20–29)
Calcium: 9.4 mg/dL (ref 8.6–10.2)
Chloride: 101 mmol/L (ref 96–106)
Creatinine, Ser: 1.03 mg/dL (ref 0.76–1.27)
GFR calc Af Amer: 91 mL/min/{1.73_m2} (ref >59)
GFR calc non Af Amer: 79 mL/min/{1.73_m2} (ref >59)
Globulin, Total: 2.9 g/dL (ref 1.5–4.5)
Glucose: 102 mg/dL — ABNORMAL HIGH (ref 65–99)
Potassium: 4.1 mmol/L (ref 3.5–5.2)
Sodium: 139 mmol/L (ref 134–144)
Total Protein: 7 g/dL (ref 6.0–8.5)

## 2019-12-30 LAB — THYROID PANEL WITH TSH
Free Thyroxine Index: 1.5 (ref 1.2–4.9)
T3 Uptake Ratio: 27 % (ref 24–39)
T4, Total: 5.6 ug/dL (ref 4.5–12.0)
TSH: 1.21 u[IU]/mL (ref 0.450–4.500)

## 2019-12-30 LAB — LIPID PANEL
Chol/HDL Ratio: 3.1 ratio (ref 0.0–5.0)
Cholesterol, Total: 117 mg/dL (ref 100–199)
HDL: 38 mg/dL — ABNORMAL LOW (ref >39)
LDL Chol Calc (NIH): 57 mg/dL (ref 0–99)
Triglycerides: 120 mg/dL (ref 0–149)
VLDL Cholesterol Cal: 22 mg/dL (ref 5–40)

## 2019-12-30 LAB — HEMOGLOBIN A1C
Est. average glucose Bld gHb Est-mCnc: 131 mg/dL
Hgb A1c MFr Bld: 6.2 % — ABNORMAL HIGH (ref 4.8–5.6)

## 2019-12-30 MED ORDER — ASPIRIN EC 81 MG PO TBEC: 81.0000 mg | DELAYED_RELEASE_TABLET | Freq: Every day | ORAL | 11 refills | Status: DC

## 2019-12-30 MED ORDER — PROAIR RESPICLICK 108 (90 BASE) MCG/ACT IN AEPB: 2.0000 | INHALATION_SPRAY | Freq: Four times a day (QID) | RESPIRATORY_TRACT | 4 refills | Status: DC | PRN

## 2019-12-30 NOTE — Assessment & Plan Note (Signed)
History of autologous vein bypass graft with coronary artery disease and stable angina pectoris  Patient has not had recent chest pain symptoms  Patient is on a statin but will need to be on a antiplatelet so I will add aspirin 81 mg daily  I will get this patient return follow-up in cardiology for reassessment

## 2019-12-30 NOTE — Telephone Encounter (Signed)
Patient is returning a call about his labs.  Please call patient to discuss at [phone]

## 2019-12-30 NOTE — Telephone Encounter (Signed)
Copied from CRM [id number]. Topic: General - Call Back - No Documentation >> Dec 30, 2019 11:02 AM Randol Kern wrote: Reason for CRM: Pt returned call to office to discuss lab results, please advise if [facility] may disclose  Best contact: [phone]

## 2020-01-01 ENCOUNTER — Telehealth: Payer: Self-pay

## 2020-01-01 NOTE — Telephone Encounter (Signed)
Contacted pt to go over lab results pt didn't answer and was unable to lvm due to vm not being set up 

## 2020-01-04 ENCOUNTER — Telehealth: Payer: Self-pay

## 2020-01-04 NOTE — Telephone Encounter (Signed)
Called pt please see notes in Epic

## 2020-01-04 NOTE — Telephone Encounter (Signed)
As instructed by NP/MD called pt/ name and DOB verified/ made aware of results and MD/NP results note. Verbalized understanding   "Please call Edward Farmer tell him blood counts normal,  liver kidney normal, cholesterol normal,  he does have prediabetes watch diet.  He needs to start aspirin daily 81mg  , thyroid is normal"

## 2020-02-10 ENCOUNTER — Encounter: Payer: Self-pay | Admitting: Critical Care Medicine

## 2020-02-10 DIAGNOSIS — R7303 Prediabetes: Secondary | ICD-10-CM | POA: Insufficient documentation

## 2020-02-11 ENCOUNTER — Encounter: Payer: Self-pay | Admitting: Cardiovascular Disease

## 2020-02-11 ENCOUNTER — Other Ambulatory Visit: Payer: Self-pay

## 2020-02-11 ENCOUNTER — Ambulatory Visit (INDEPENDENT_AMBULATORY_CARE_PROVIDER_SITE_OTHER): Payer: Medicaid Other | Admitting: Cardiovascular Disease

## 2020-02-11 DIAGNOSIS — E782 Mixed hyperlipidemia: Secondary | ICD-10-CM

## 2020-02-11 DIAGNOSIS — Z951 Presence of aortocoronary bypass graft: Secondary | ICD-10-CM | POA: Diagnosis not present

## 2020-02-11 DIAGNOSIS — I10 Essential (primary) hypertension: Secondary | ICD-10-CM

## 2020-02-11 DIAGNOSIS — F17200 Nicotine dependence, unspecified, uncomplicated: Secondary | ICD-10-CM | POA: Diagnosis not present

## 2020-02-11 DIAGNOSIS — E785 Hyperlipidemia, unspecified: Secondary | ICD-10-CM | POA: Insufficient documentation

## 2020-02-11 NOTE — Progress Notes (Signed)
02/11/2020 Edward Farmer   07-13-1960  [id number]  Primary Physician [name], MD Primary Cardiologist: Runell Gess MD FACP, [place], [place], [place]  HPI:  Edward Farmer is a 58 y.o. mildly overweight single African-American male father of 5 children, grandfather of 3 grandchildren who does not work (used to [employer] work) and was sent back to me by Dr. Delford Field to be reestablished because of known CAD.  History factors include tobacco abuse on this since his procedure he is only smoked occasional cigars, hypertension hyperlipidemia.  I saw him initially in consultation after being admitted to the hospital with left-sided chest pain while cooking a meal shortness of breath, left arm numbness and diaphoresis on 07/15/2016.  He underwent diagnostic coronary angiography by Dr. Herbie Baltimore on 07/16/2016 revealing high-grade ostial LAD disease left dominant system with normal LV function.  The following day he underwent CABG x1 by Dr. Cornelius Moras with a LIMA to the LAD done off-pump.  He is done well since and has not been seen back in follow-up.  He denies chest pain or shortness of breath.   Current Meds  Medication Sig  . amLODipine (NORVASC) 10 MG tablet Take 1 tablet (10 mg total) by mouth daily.  [name] aspirin EC 81 MG tablet Take 1 tablet (81 mg total) by mouth daily. Swallow whole.  [name] atorvastatin (LIPITOR) 20 MG tablet Take 1 tablet (20 mg total) by mouth daily.  . carvedilol (COREG) 25 MG tablet Take 1 tablet (25 mg total) by mouth 2 (two) times daily with a meal.  . hydrochlorothiazide (HYDRODIURIL) 25 MG tablet Take 1 tablet (25 mg total) by mouth daily.  . nicotine (NICODERM CQ - DOSED IN MG/24 HOURS) 14 mg/24hr patch Place 1 patch (14 mg total) onto the skin daily.  [name] triamcinolone cream (KENALOG) 0.1 % Apply 1 application topically 2 (two) times daily.     No Known Allergies  Social History   Socioeconomic History  . Marital status: Legally Separated    Spouse name: Not on file    . Number of children: Not on file  . Years of education: Not on file  . Highest education level: Not on file  Occupational History  . Not on file  Tobacco Use  . Smoking status: Current Every Day Smoker    Packs/day: 0.25    Types: Cigars  . Smokeless tobacco: Never Used  . Tobacco comment: cutting back  Substance and Sexual Activity  . Alcohol use: Yes    Alcohol/week: 0.0 standard drinks    Comment: occ  . Drug use: No  . Sexual activity: Not on file  Other Topics Concern  . Not on file  Social History Narrative  . Not on file   Social Determinants of Health   Financial Resource Strain:   . Difficulty of Paying Living Expenses: Not on file  Food Insecurity:   . Worried About Programme researcher, broadcasting/film/video in the Last Year: Not on file  . Ran Out of Food in the Last Year: Not on file  Transportation Needs:   . Lack of Transportation (Medical): Not on file  . Lack of Transportation (Non-Medical): Not on file  Physical Activity:   . Days of Exercise per Week: Not on file  . Minutes of Exercise per Session: Not on file  Stress:   . Feeling of Stress : Not on file  Social Connections:   . Frequency of Communication with Friends and Family: Not on file  .  Frequency of Social Gatherings with Friends and Family: Not on file  . Attends Religious Services: Not on file  . Active Member of Clubs or Organizations: Not on file  . Attends Banker Meetings: Not on file  . Marital Status: Not on file  Intimate Partner Violence:   . Fear of Current or Ex-Partner: Not on file  . Emotionally Abused: Not on file  . Physically Abused: Not on file  . Sexually Abused: Not on file     Review of Systems: General: negative for chills, fever, night sweats or weight changes.  Cardiovascular: negative for chest pain, dyspnea on exertion, edema, orthopnea, palpitations, paroxysmal nocturnal dyspnea or shortness of breath Dermatological: negative for rash Respiratory: negative for cough  or wheezing Urologic: negative for hematuria Abdominal: negative for nausea, vomiting, diarrhea, bright red blood per rectum, melena, or hematemesis Neurologic: negative for visual changes, syncope, or dizziness All other systems reviewed and are otherwise negative except as noted above.    Blood pressure (!) 178/117, pulse 69, height 5\' 10"  (1.778 m), weight 219 lb (99.3 kg), SpO2 98 %.  General appearance: alert and no distress Neck: no adenopathy, no carotid bruit, no JVD, supple, symmetrical, trachea midline and thyroid not enlarged, symmetric, no tenderness/mass/nodules Lungs: clear to auscultation bilaterally Heart: regular rate and rhythm, S1, S2 normal, no murmur, click, rub or gallop Extremities: extremities normal, atraumatic, no cyanosis or edema Pulses: 2+ and symmetric Skin: Skin color, texture, turgor normal. No rashes or lesions Neurologic: Alert and oriented X 3, normal strength and tone. Normal symmetric reflexes. Normal coordination and gait  EKG sinus rhythm at 69 with evidence of LVH with repolarization changes.  Personally reviewed this EKG.  This is unchanged from his last EKG.  ASSESSMENT AND PLAN:   Hx of CABG History of CAD status post cardiac catheterization performed by Dr. in the setting of unstable angina 07/16/2016 revealing high-grade ostial LAD disease and a left dominant system.  The following day he underwent CABG x1 by Dr. 07/18/2016 with a LIMA to his LAD done off-pump.  He is remained stable without symptoms since that time.  Essential hypertension History of essential hypertension a blood pressure measured today at 178/117.  This is unusual for him.  He is on carvedilol, amlodipine and hydrochlorothiazide.  Tobacco dependence History of ongoing tobacco abuse smoking occasional cigar usually during football games but no longer smoking cigarettes.  Hyperlipidemia History of hyperlipidemia on statin therapy with lipid profile performed 12/29/2019  revealing total cholesterol of 117, LDL 57 and HDL of 38.      12/31/2019 MD FACP,FACC,FAHA, [facility] 02/11/2020 10:11 AM

## 2020-02-11 NOTE — Assessment & Plan Note (Signed)
History of CAD status post cardiac catheterization performed by Dr. Herbie Baltimore in the setting of unstable angina 07/16/2016 revealing high-grade ostial LAD disease and a left dominant system.  The following day he underwent CABG x1 by Dr. Cornelius Moras with a LIMA to his LAD done off-pump.  He is remained stable without symptoms since that time.

## 2020-02-11 NOTE — Assessment & Plan Note (Signed)
History of essential hypertension a blood pressure measured today at 178/117.  This is unusual for him.  He is on carvedilol, amlodipine and hydrochlorothiazide.

## 2020-02-11 NOTE — Assessment & Plan Note (Signed)
History of ongoing tobacco abuse smoking occasional cigar usually during football games but no longer smoking cigarettes.

## 2020-02-11 NOTE — Patient Instructions (Signed)

## 2020-02-11 NOTE — Assessment & Plan Note (Signed)
History of hyperlipidemia on statin therapy with lipid profile performed 12/29/2019 revealing total cholesterol of 117, LDL 57 and HDL of 38.

## 2020-04-06 ENCOUNTER — Ambulatory Visit: Payer: Medicaid Other | Admitting: Critical Care Medicine

## 2020-04-06 NOTE — Progress Notes (Deleted)
Subjective:    Patient ID: Edward Farmer, male    DOB: 10-Aug-1960, 59 y.o.   MRN: [id number]  12/29/2019 This is a pleasant black male who I follow in the [facility] comes in today to establish for primary care.  He is 59 years old has a history of hypertension, coronary artery disease status post bypass graft in 2018.   The patient was started on amlodipine 10 mg daily Coreg 25 mg twice daily and hydrochlorthiazide 25 mg daily at the shelter clinic.  Note there is a family history of premature coronary disease.  The patient still has active smoking at this time.  Patient comes in on arrival with blood pressure 116/80.  He has had no complaints.  He is doing well from a medical standpoint.  He does have a history of hepatitis C but was treated for this.  He is down to 3 cigarettes daily and wishes a refill on his nicotine patch.  He is committed to quitting smoking at this time.  Note the patient did have a Covid vaccine series already.  However he is declining a flu vaccine at this visit wishes to think about this.  He does wish to have an albuterol refill because he has history of asthma and the heat has been difficult for his breathing    The patient is on atorvastatin 20 mg daily and needs a follow-up of lipid panel  The patient will need cardiology follow-up as he is not seen cardiology in over 3 years  04/06/2020  Essential hypertension Essential hypertension under good control will refill current medications and not make further changes  We will obtain complete metabolic panel complete blood count and thyroid study  Asthma, chronic Asthma stable at this time we will refill the patient's albuterol  Obesity, unspecified Dietary recommendations were given for hypertension and also to lose weight  Tobacco dependence    Current smoking consumption amount: 3 to 5 cigarettes daily  Dicsussion on advise to quit smoking and smoking impacts: Impacts on  cardiovascular and lung health  Patient's willingness to quit: Patient wishes to quit now    Past Medical History:  Diagnosis Date  . Asthma   . Bradycardia 07/14/2016   on BB. Rate improved with discontinuation.   . Hypertension   . Tobacco abuse      Family History  Problem Relation Age of Onset  . Migraines Mother   . Heart attack Father      Social History   Socioeconomic History  . Marital status: Legally Separated    Spouse name: Not on file  . Number of children: Not on file  . Years of education: Not on file  . Highest education level: Not on file  Occupational History  . Not on file  Tobacco Use  . Smoking status: Current Every Day Smoker    Packs/day: 0.25    Types: Cigars  . Smokeless tobacco: Never Used  . Tobacco comment: cutting back  Substance and Sexual Activity  . Alcohol use: Yes    Alcohol/week: 0.0 standard drinks    Comment: occ  . Drug use: No  . Sexual activity: Not on file  Other Topics Concern  . Not on file  Social History Narrative  . Not on file   Social Determinants of Health   Financial Resource Strain:   . Difficulty of Paying Living Expenses: Not on file  Food Insecurity:   . Worried About Programme researcher, broadcasting/film/video in  the Last Year: Not on file  . Ran Out of Food in the Last Year: Not on file  Transportation Needs:   . Lack of Transportation (Medical): Not on file  . Lack of Transportation (Non-Medical): Not on file  Physical Activity:   . Days of Exercise per Week: Not on file  . Minutes of Exercise per Session: Not on file  Stress:   . Feeling of Stress : Not on file  Social Connections:   . Frequency of Communication with Friends and Family: Not on file  . Frequency of Social Gatherings with Friends and Family: Not on file  . Attends Religious Services: Not on file  . Active Member of Clubs or Organizations: Not on file  . Attends Banker Meetings: Not on file  . Marital Status: Not on file  Intimate  Partner Violence:   . Fear of Current or Ex-Partner: Not on file  . Emotionally Abused: Not on file  . Physically Abused: Not on file  . Sexually Abused: Not on file     No Known Allergies   Outpatient Medications Prior to Visit  Medication Sig Dispense Refill  . amLODipine (NORVASC) 10 MG tablet Take 1 tablet (10 mg total) by mouth daily. 90 tablet 3  . aspirin EC 81 MG tablet Take 1 tablet (81 mg total) by mouth daily. Swallow whole. 30 tablet 11  . atorvastatin (LIPITOR) 20 MG tablet Take 1 tablet (20 mg total) by mouth daily. 90 tablet 3  . carvedilol (COREG) 25 MG tablet Take 1 tablet (25 mg total) by mouth 2 (two) times daily with a meal. 180 tablet 3  . hydrochlorothiazide (HYDRODIURIL) 25 MG tablet Take 1 tablet (25 mg total) by mouth daily. 90 tablet 3  . nicotine (NICODERM CQ - DOSED IN MG/24 HOURS) 14 mg/24hr patch Place 1 patch (14 mg total) onto the skin daily. 28 patch 1  . triamcinolone cream (KENALOG) 0.1 % Apply 1 application topically 2 (two) times daily. 30 g 0   No facility-administered medications prior to visit.     Review of Systems  Constitutional: Negative.   HENT: Negative.   Respiratory: Positive for shortness of breath and wheezing.   Endocrine: Negative.   Genitourinary: Negative.   Musculoskeletal: Negative.   Skin: Positive for color change. Negative for rash.  Neurological: Negative.   Hematological: Negative.   Psychiatric/Behavioral: Positive for dysphoric mood. Negative for agitation, behavioral problems, confusion, hallucinations, self-injury, sleep disturbance and suicidal ideas. The patient is not nervous/anxious and is not hyperactive.        Objective:   Physical Exam There were no vitals filed for this visit.  Gen: Pleasant, well-nourished, in no distress,  normal affect  ENT: No lesions,  mouth clear,  oropharynx clear, no postnasal drip  Neck: No JVD, no TMG, no carotid bruits  Lungs: No use of accessory muscles, no dullness to  percussion, clear without rales or rhonchi  Cardiovascular: RRR, heart sounds normal, no murmur or gallops, no peripheral edema  Abdomen: soft and NT, no HSM,  BS normal  Musculoskeletal: No deformities, no cyanosis or clubbing  Neuro: alert, non focal  Skin: Warm, no lesions or rashes  No results found.        Assessment & Plan:  I personally reviewed all images and lab data in the [facility] system as well as any outside material available during this office visit and agree with the  radiology impressions.   No problem-specific Assessment & Plan notes  found for this encounter.   There are no diagnoses linked to this encounter.

## 2020-04-18 ENCOUNTER — Emergency Department (HOSPITAL_COMMUNITY): Payer: Medicaid Other

## 2020-04-18 ENCOUNTER — Other Ambulatory Visit: Payer: Self-pay

## 2020-04-18 ENCOUNTER — Encounter (HOSPITAL_COMMUNITY): Payer: Self-pay | Admitting: Emergency Medicine

## 2020-04-18 ENCOUNTER — Inpatient Hospital Stay (HOSPITAL_COMMUNITY)
Admission: EM | Admit: 2020-04-18 | Discharge: 2020-04-21 | DRG: 286 | Disposition: A | Discharge status: Home / Self Care | Payer: Medicaid Other | Attending: Cardiology | Admitting: Cardiology

## 2020-04-18 DIAGNOSIS — I5031 Acute diastolic (congestive) heart failure: Secondary | ICD-10-CM | POA: Diagnosis present

## 2020-04-18 DIAGNOSIS — R079 Chest pain, unspecified: Secondary | ICD-10-CM | POA: Diagnosis not present

## 2020-04-18 DIAGNOSIS — I169 Hypertensive crisis, unspecified: Secondary | ICD-10-CM | POA: Diagnosis present

## 2020-04-18 DIAGNOSIS — I200 Unstable angina: Secondary | ICD-10-CM

## 2020-04-18 DIAGNOSIS — E669 Obesity, unspecified: Secondary | ICD-10-CM | POA: Diagnosis present

## 2020-04-18 DIAGNOSIS — I208 Other forms of angina pectoris: Secondary | ICD-10-CM

## 2020-04-18 DIAGNOSIS — E785 Hyperlipidemia, unspecified: Secondary | ICD-10-CM | POA: Diagnosis present

## 2020-04-18 DIAGNOSIS — Z79899 Other long term (current) drug therapy: Secondary | ICD-10-CM

## 2020-04-18 DIAGNOSIS — G4733 Obstructive sleep apnea (adult) (pediatric): Secondary | ICD-10-CM | POA: Diagnosis present

## 2020-04-18 DIAGNOSIS — Z951 Presence of aortocoronary bypass graft: Secondary | ICD-10-CM

## 2020-04-18 DIAGNOSIS — I209 Angina pectoris, unspecified: Secondary | ICD-10-CM | POA: Diagnosis not present

## 2020-04-18 DIAGNOSIS — I10 Essential (primary) hypertension: Secondary | ICD-10-CM | POA: Diagnosis not present

## 2020-04-18 DIAGNOSIS — J45909 Unspecified asthma, uncomplicated: Secondary | ICD-10-CM | POA: Diagnosis present

## 2020-04-18 DIAGNOSIS — F17200 Nicotine dependence, unspecified, uncomplicated: Secondary | ICD-10-CM

## 2020-04-18 DIAGNOSIS — Z6832 Body mass index (BMI) 32.0-32.9, adult: Secondary | ICD-10-CM

## 2020-04-18 DIAGNOSIS — Z8249 Family history of ischemic heart disease and other diseases of the circulatory system: Secondary | ICD-10-CM

## 2020-04-18 DIAGNOSIS — F17290 Nicotine dependence, other tobacco product, uncomplicated: Secondary | ICD-10-CM | POA: Diagnosis present

## 2020-04-18 DIAGNOSIS — I110 Hypertensive heart disease with heart failure: Secondary | ICD-10-CM | POA: Diagnosis present

## 2020-04-18 DIAGNOSIS — I213 ST elevation (STEMI) myocardial infarction of unspecified site: Secondary | ICD-10-CM | POA: Diagnosis not present

## 2020-04-18 DIAGNOSIS — Z20822 Contact with and (suspected) exposure to covid-19: Secondary | ICD-10-CM | POA: Diagnosis present

## 2020-04-18 DIAGNOSIS — I25110 Atherosclerotic heart disease of native coronary artery with unstable angina pectoris: Principal | ICD-10-CM | POA: Diagnosis present

## 2020-04-18 DIAGNOSIS — I25718 Atherosclerosis of autologous vein coronary artery bypass graft(s) with other forms of angina pectoris: Secondary | ICD-10-CM

## 2020-04-18 DIAGNOSIS — Z7982 Long term (current) use of aspirin: Secondary | ICD-10-CM

## 2020-04-18 DIAGNOSIS — R0789 Other chest pain: Secondary | ICD-10-CM | POA: Diagnosis not present

## 2020-04-18 HISTORY — DX: Atherosclerotic heart disease of native coronary artery without angina pectoris: I25.10

## 2020-04-18 LAB — CBC
HCT: 45.9 % (ref 39.0–52.0)
Hemoglobin: 14.8 g/dL (ref 13.0–17.0)
MCH: 29.1 pg (ref 26.0–34.0)
MCHC: 32.2 g/dL (ref 30.0–36.0)
MCV: 90.4 fL (ref 80.0–100.0)
Platelets: 218 10*3/uL (ref 150–400)
RBC: 5.08 MIL/uL (ref 4.22–5.81)
RDW: 16.4 % — ABNORMAL HIGH (ref 11.5–15.5)
WBC: 10.6 10*3/uL — ABNORMAL HIGH (ref 4.0–10.5)
nRBC: 0 % (ref 0.0–0.2)

## 2020-04-18 LAB — BASIC METABOLIC PANEL
Anion gap: 13 (ref 5–15)
BUN: 13 mg/dL (ref 6–20)
CO2: 25 mmol/L (ref 22–32)
Calcium: 9.6 mg/dL (ref 8.9–10.3)
Chloride: 100 mmol/L (ref 98–111)
Creatinine, Ser: 1.31 mg/dL — ABNORMAL HIGH (ref 0.61–1.24)
GFR, Estimated: >60 mL/min (ref >60)
Glucose, Bld: 80 mg/dL (ref 70–99)
Potassium: 3.9 mmol/L (ref 3.5–5.1)
Sodium: 138 mmol/L (ref 135–145)

## 2020-04-18 LAB — TROPONIN I (HIGH SENSITIVITY): Troponin I (High Sensitivity): 54 ng/L — ABNORMAL HIGH (ref <18)

## 2020-04-18 NOTE — ED Triage Notes (Signed)
Pt to ED with c/o left chest pain off and on x's 1 week also st's he has shortness of breath.  Nausea without vomiting. No appetite  Pt has been vaccinated

## 2020-04-19 ENCOUNTER — Inpatient Hospital Stay (HOSPITAL_COMMUNITY): Payer: Medicaid Other

## 2020-04-19 ENCOUNTER — Inpatient Hospital Stay (HOSPITAL_COMMUNITY)
Admission: EM | Disposition: A | Discharge status: Home / Self Care | Payer: Self-pay | Source: Home / Self Care | Attending: Cardiology

## 2020-04-19 DIAGNOSIS — I25110 Atherosclerotic heart disease of native coronary artery with unstable angina pectoris: Secondary | ICD-10-CM | POA: Diagnosis not present

## 2020-04-19 DIAGNOSIS — I169 Hypertensive crisis, unspecified: Secondary | ICD-10-CM | POA: Diagnosis present

## 2020-04-19 DIAGNOSIS — E669 Obesity, unspecified: Secondary | ICD-10-CM | POA: Diagnosis present

## 2020-04-19 DIAGNOSIS — R079 Chest pain, unspecified: Secondary | ICD-10-CM | POA: Diagnosis not present

## 2020-04-19 DIAGNOSIS — I200 Unstable angina: Secondary | ICD-10-CM | POA: Diagnosis not present

## 2020-04-19 DIAGNOSIS — J45909 Unspecified asthma, uncomplicated: Secondary | ICD-10-CM | POA: Diagnosis present

## 2020-04-19 DIAGNOSIS — Z6832 Body mass index (BMI) 32.0-32.9, adult: Secondary | ICD-10-CM | POA: Diagnosis not present

## 2020-04-19 DIAGNOSIS — Z20822 Contact with and (suspected) exposure to covid-19: Secondary | ICD-10-CM | POA: Diagnosis present

## 2020-04-19 DIAGNOSIS — Z7982 Long term (current) use of aspirin: Secondary | ICD-10-CM | POA: Diagnosis not present

## 2020-04-19 DIAGNOSIS — Z951 Presence of aortocoronary bypass graft: Secondary | ICD-10-CM

## 2020-04-19 DIAGNOSIS — F17290 Nicotine dependence, other tobacco product, uncomplicated: Secondary | ICD-10-CM | POA: Diagnosis present

## 2020-04-19 DIAGNOSIS — E785 Hyperlipidemia, unspecified: Secondary | ICD-10-CM | POA: Diagnosis present

## 2020-04-19 DIAGNOSIS — Z79899 Other long term (current) drug therapy: Secondary | ICD-10-CM | POA: Diagnosis not present

## 2020-04-19 DIAGNOSIS — I110 Hypertensive heart disease with heart failure: Secondary | ICD-10-CM | POA: Diagnosis present

## 2020-04-19 DIAGNOSIS — Z8249 Family history of ischemic heart disease and other diseases of the circulatory system: Secondary | ICD-10-CM | POA: Diagnosis not present

## 2020-04-19 DIAGNOSIS — I5031 Acute diastolic (congestive) heart failure: Secondary | ICD-10-CM | POA: Diagnosis present

## 2020-04-19 DIAGNOSIS — G4733 Obstructive sleep apnea (adult) (pediatric): Secondary | ICD-10-CM | POA: Diagnosis present

## 2020-04-19 DIAGNOSIS — I209 Angina pectoris, unspecified: Secondary | ICD-10-CM | POA: Diagnosis not present

## 2020-04-19 HISTORY — PX: LEFT HEART CATH AND CORONARY ANGIOGRAPHY: CATH118249

## 2020-04-19 LAB — CBC
HCT: 46.2 % (ref 39.0–52.0)
Hemoglobin: 14.4 g/dL (ref 13.0–17.0)
MCH: 28.6 pg (ref 26.0–34.0)
MCHC: 31.2 g/dL (ref 30.0–36.0)
MCV: 91.7 fL (ref 80.0–100.0)
Platelets: 210 10*3/uL (ref 150–400)
RBC: 5.04 MIL/uL (ref 4.22–5.81)
RDW: 16.5 % — ABNORMAL HIGH (ref 11.5–15.5)
WBC: 8.8 10*3/uL (ref 4.0–10.5)
nRBC: 0 % (ref 0.0–0.2)

## 2020-04-19 LAB — CREATININE, SERUM
Creatinine, Ser: 1.08 mg/dL (ref 0.61–1.24)
GFR, Estimated: >60 mL/min (ref >60)

## 2020-04-19 LAB — TROPONIN I (HIGH SENSITIVITY)
Troponin I (High Sensitivity): 34 ng/L — ABNORMAL HIGH (ref <18)
Troponin I (High Sensitivity): 36 ng/L — ABNORMAL HIGH (ref <18)
Troponin I (High Sensitivity): 47 ng/L — ABNORMAL HIGH (ref <18)

## 2020-04-19 LAB — BASIC METABOLIC PANEL
Anion gap: 9 (ref 5–15)
BUN: 12 mg/dL (ref 6–20)
CO2: 21 mmol/L — ABNORMAL LOW (ref 22–32)
Calcium: 8.7 mg/dL — ABNORMAL LOW (ref 8.9–10.3)
Chloride: 103 mmol/L (ref 98–111)
Creatinine, Ser: 1.1 mg/dL (ref 0.61–1.24)
GFR, Estimated: >60 mL/min (ref >60)
Glucose, Bld: 115 mg/dL — ABNORMAL HIGH (ref 70–99)
Potassium: 4.2 mmol/L (ref 3.5–5.1)
Sodium: 133 mmol/L — ABNORMAL LOW (ref 135–145)

## 2020-04-19 LAB — RESP PANEL BY RT-PCR (FLU A&B, COVID) ARPGX2
Influenza A by PCR: NEGATIVE
Influenza B by PCR: NEGATIVE
SARS Coronavirus 2 by RT PCR: NEGATIVE

## 2020-04-19 LAB — LIPID PANEL
Cholesterol: 186 mg/dL (ref 0–200)
HDL: 31 mg/dL — ABNORMAL LOW (ref >40)
LDL Cholesterol: 139 mg/dL — ABNORMAL HIGH (ref 0–99)
Total CHOL/HDL Ratio: 6 RATIO
Triglycerides: 79 mg/dL (ref <150)
VLDL: 16 mg/dL (ref 0–40)

## 2020-04-19 LAB — BRAIN NATRIURETIC PEPTIDE: B Natriuretic Peptide: 32.5 pg/mL (ref 0.0–100.0)

## 2020-04-19 LAB — HEMOGLOBIN A1C
Hgb A1c MFr Bld: 6 % — ABNORMAL HIGH (ref 4.8–5.6)
Mean Plasma Glucose: 125.5 mg/dL

## 2020-04-19 LAB — HIV ANTIBODY (ROUTINE TESTING W REFLEX): HIV Screen 4th Generation wRfx: NONREACTIVE

## 2020-04-19 SURGERY — LEFT HEART CATH AND CORONARY ANGIOGRAPHY
Anesthesia: LOCAL

## 2020-04-19 MED ORDER — SODIUM CHLORIDE 0.9% FLUSH
3.0000 mL | Freq: Two times a day (BID) | INTRAVENOUS | Status: DC
  Administered 2020-04-20 – 2020-04-21 (×3): 3 mL via INTRAVENOUS

## 2020-04-19 MED ORDER — FENTANYL CITRATE (PF) 100 MCG/2ML IJ SOLN
INTRAMUSCULAR | Status: AC
  Filled 2020-04-19: qty 2

## 2020-04-19 MED ORDER — NITROGLYCERIN 0.4 MG SL SUBL: 0.4000 mg | SUBLINGUAL_TABLET | SUBLINGUAL | Status: DC | PRN

## 2020-04-19 MED ORDER — ONDANSETRON HCL 4 MG/2ML IJ SOLN: 4.0000 mg | Freq: Four times a day (QID) | INTRAMUSCULAR | Status: DC | PRN

## 2020-04-19 MED ORDER — FENTANYL CITRATE (PF) 100 MCG/2ML IJ SOLN
INTRAMUSCULAR | Status: DC | PRN
  Administered 2020-04-19: 50 ug via INTRAVENOUS

## 2020-04-19 MED ORDER — LOSARTAN POTASSIUM 50 MG PO TABS: 50.0000 mg | ORAL_TABLET | Freq: Every day | ORAL | Status: DC

## 2020-04-19 MED ORDER — HEPARIN (PORCINE) IN NACL 1000-0.9 UT/500ML-% IV SOLN
INTRAVENOUS | Status: AC
  Filled 2020-04-19: qty 1000

## 2020-04-19 MED ORDER — CARVEDILOL 25 MG PO TABS
25.0000 mg | ORAL_TABLET | Freq: Two times a day (BID) | ORAL | Status: DC
  Administered 2020-04-19 – 2020-04-21 (×4): 25 mg via ORAL
  Filled 2020-04-19 (×3): qty 1
  Filled 2020-04-19: qty 8

## 2020-04-19 MED ORDER — ATORVASTATIN CALCIUM 10 MG PO TABS: 20.0000 mg | ORAL_TABLET | Freq: Every day | ORAL | Status: DC

## 2020-04-19 MED ORDER — VERAPAMIL HCL 2.5 MG/ML IV SOLN
INTRAVENOUS | Status: AC
  Filled 2020-04-19: qty 2

## 2020-04-19 MED ORDER — NITROGLYCERIN IN D5W 200-5 MCG/ML-% IV SOLN
0.0000 ug/min | INTRAVENOUS | Status: DC
  Administered 2020-04-19: 08:00:00 5 ug/min via INTRAVENOUS
  Filled 2020-04-19: qty 250

## 2020-04-19 MED ORDER — SODIUM CHLORIDE 0.9 % WEIGHT BASED INFUSION
1.0000 mL/kg/h | INTRAVENOUS | Status: DC
  Administered 2020-04-19: 13:00:00 1 mL/kg/h via INTRAVENOUS

## 2020-04-19 MED ORDER — SODIUM CHLORIDE 0.9% FLUSH: 3.0000 mL | Freq: Two times a day (BID) | INTRAVENOUS | Status: DC

## 2020-04-19 MED ORDER — HEPARIN (PORCINE) IN NACL 1000-0.9 UT/500ML-% IV SOLN
INTRAVENOUS | Status: DC | PRN
  Administered 2020-04-19 (×2): 500 mL

## 2020-04-19 MED ORDER — SODIUM CHLORIDE 0.9 % IV SOLN: 250.0000 mL | INTRAVENOUS | Status: DC | PRN

## 2020-04-19 MED ORDER — ACETAMINOPHEN 325 MG PO TABS: 650.0000 mg | ORAL_TABLET | ORAL | Status: DC | PRN

## 2020-04-19 MED ORDER — ASPIRIN 81 MG PO CHEW
324.0000 mg | CHEWABLE_TABLET | Freq: Once | ORAL | Status: AC
  Administered 2020-04-19: 04:00:00 324 mg via ORAL
  Filled 2020-04-19: qty 4

## 2020-04-19 MED ORDER — ASPIRIN 81 MG PO CHEW
81.0000 mg | CHEWABLE_TABLET | ORAL | Status: AC
  Administered 2020-04-19: 12:00:00 81 mg via ORAL
  Filled 2020-04-19: qty 1

## 2020-04-19 MED ORDER — IOHEXOL 350 MG/ML SOLN
INTRAVENOUS | Status: DC | PRN
  Administered 2020-04-19: 16:00:00 95 mL

## 2020-04-19 MED ORDER — ASPIRIN EC 81 MG PO TBEC: 81.0000 mg | DELAYED_RELEASE_TABLET | Freq: Every day | ORAL | Status: DC

## 2020-04-19 MED ORDER — LIDOCAINE HCL (PF) 1 % IJ SOLN
INTRAMUSCULAR | Status: DC | PRN
  Administered 2020-04-19: 15 mL

## 2020-04-19 MED ORDER — SODIUM CHLORIDE 0.9 % IV SOLN
INTRAVENOUS | Status: AC | PRN
  Administered 2020-04-19: 10 mL/h via INTRAVENOUS

## 2020-04-19 MED ORDER — LIDOCAINE HCL (PF) 1 % IJ SOLN
INTRAMUSCULAR | Status: AC
  Filled 2020-04-19: qty 30

## 2020-04-19 MED ORDER — SODIUM CHLORIDE 0.9 % IV SOLN: INTRAVENOUS | Status: AC

## 2020-04-19 MED ORDER — HYDRALAZINE HCL 20 MG/ML IJ SOLN
10.0000 mg | INTRAMUSCULAR | Status: AC | PRN
  Administered 2020-04-19: 18:00:00 10 mg via INTRAVENOUS

## 2020-04-19 MED ORDER — MIDAZOLAM HCL 2 MG/2ML IJ SOLN
INTRAMUSCULAR | Status: DC | PRN
  Administered 2020-04-19: 2 mg via INTRAVENOUS

## 2020-04-19 MED ORDER — MIDAZOLAM HCL 2 MG/2ML IJ SOLN
INTRAMUSCULAR | Status: AC
  Filled 2020-04-19: qty 2

## 2020-04-19 MED ORDER — ASPIRIN 81 MG PO CHEW
81.0000 mg | CHEWABLE_TABLET | Freq: Every day | ORAL | Status: DC
  Administered 2020-04-20 – 2020-04-21 (×2): 81 mg via ORAL
  Filled 2020-04-19 (×2): qty 1

## 2020-04-19 MED ORDER — SODIUM CHLORIDE 0.9 % IV SOLN
250.0000 mL | INTRAVENOUS | Status: DC | PRN
Start: 2020-04-19 — End: 2020-04-19

## 2020-04-19 MED ORDER — SODIUM CHLORIDE 0.9 % WEIGHT BASED INFUSION
3.0000 mL/kg/h | INTRAVENOUS | Status: DC
  Administered 2020-04-19: 12:00:00 3 mL/kg/h via INTRAVENOUS

## 2020-04-19 MED ORDER — HYDRALAZINE HCL 20 MG/ML IJ SOLN
INTRAMUSCULAR | Status: AC
  Filled 2020-04-19: qty 1

## 2020-04-19 MED ORDER — AMLODIPINE BESYLATE 5 MG PO TABS
10.0000 mg | ORAL_TABLET | Freq: Every day | ORAL | Status: DC
  Administered 2020-04-20 – 2020-04-21 (×2): 10 mg via ORAL
  Filled 2020-04-19 (×2): qty 2

## 2020-04-19 MED ORDER — DIAZEPAM 5 MG PO TABS: 5.0000 mg | ORAL_TABLET | Freq: Four times a day (QID) | ORAL | Status: DC | PRN

## 2020-04-19 MED ORDER — ASPIRIN 81 MG PO CHEW: 81.0000 mg | CHEWABLE_TABLET | ORAL | Status: DC

## 2020-04-19 MED ORDER — SODIUM CHLORIDE 0.9% FLUSH: 3.0000 mL | INTRAVENOUS | Status: DC | PRN

## 2020-04-19 MED ORDER — SODIUM CHLORIDE 0.9 % WEIGHT BASED INFUSION: 3.0000 mL/kg/h | INTRAVENOUS | Status: DC

## 2020-04-19 MED ORDER — HEPARIN SODIUM (PORCINE) 5000 UNIT/ML IJ SOLN
5000.0000 [IU] | Freq: Three times a day (TID) | INTRAMUSCULAR | Status: DC
  Administered 2020-04-19 – 2020-04-20 (×3): 5000 [IU] via SUBCUTANEOUS
  Filled 2020-04-19 (×5): qty 1

## 2020-04-19 MED ORDER — HYDRALAZINE HCL 20 MG/ML IJ SOLN: 10.0000 mg | Freq: Four times a day (QID) | INTRAMUSCULAR | Status: DC | PRN

## 2020-04-19 MED ORDER — SODIUM CHLORIDE 0.9 % WEIGHT BASED INFUSION: 1.0000 mL/kg/h | INTRAVENOUS | Status: DC

## 2020-04-19 MED ORDER — FUROSEMIDE 10 MG/ML IJ SOLN: 80.0000 mg | Freq: Once | INTRAMUSCULAR | Status: DC

## 2020-04-19 MED ORDER — AMLODIPINE BESYLATE 5 MG PO TABS: 10.0000 mg | ORAL_TABLET | Freq: Every day | ORAL | Status: DC

## 2020-04-19 MED ORDER — LABETALOL HCL 5 MG/ML IV SOLN: 10.0000 mg | INTRAVENOUS | Status: AC | PRN

## 2020-04-19 SURGICAL SUPPLY — 9 items
CATH INFINITI 5 FR IM (CATHETERS) ×1 IMPLANT
CATH INFINITI 5FR MULTPACK ANG (CATHETERS) ×1 IMPLANT
GUIDEWIRE ANGLED .035X260CM (WIRE) ×1 IMPLANT
KIT HEART LEFT (KITS) ×2 IMPLANT
PACK CARDIAC CATHETERIZATION (CUSTOM PROCEDURE TRAY) ×2 IMPLANT
SHEATH PINNACLE 5F 10CM (SHEATH) ×1 IMPLANT
SHEATH PROBE COVER 6X72 (BAG) ×1 IMPLANT
TRANSDUCER W/STOPCOCK (MISCELLANEOUS) ×2 IMPLANT
WIRE EMERALD 3MM-J .035X150CM (WIRE) ×1 IMPLANT

## 2020-04-19 NOTE — H&P (View-Only) (Signed)
Cardiology Progress Note  Patient ID: Edward Farmer MRN: [id number] DOB: [birth year] Date of Encounter: 04/19/2020  Primary Cardiologist: No primary care provider on file.  Subjective   Chief Complaint: Chest pain  HPI: Intermittent chest pain similar to prior episodes of angina.  Blood pressure elevated.  Troponins are flat but mildly elevated.  No further chest pain.  ROS:  All other ROS reviewed and negative. Pertinent positives noted in the HPI.     Inpatient Medications  Scheduled Meds: . amLODipine  10 mg Oral Daily  . [START ON 04/20/2020] aspirin EC  81 mg Oral Daily  . atorvastatin  20 mg Oral q1800  . carvedilol  25 mg Oral BID WC  . heparin  5,000 Units Subcutaneous Q8H  . losartan  50 mg Oral Daily   Continuous Infusions: . nitroGLYCERIN     PRN Meds: acetaminophen, hydrALAZINE, nitroGLYCERIN, ondansetron (ZOFRAN) IV   Vital Signs   Vitals:   04/19/20 0545 04/19/20 0600 04/19/20 0700 04/19/20 0715  BP: (!) 164/124 (!) 174/116 (!) 168/105 (!) 170/111  Pulse: 78 78 66 74  Resp: (!) 28 15 16 12   Temp:      TempSrc:      SpO2: 96% 97% 99% 99%  Weight:      Height:       No intake or output data in the 24 hours ending 04/19/20 0746 Last 3 Weights 04/18/2020 02/11/2020 12/29/2019  Weight (lbs) 218 lb 219 lb 219 lb  Weight (kg) 98.884 kg 99.338 kg 99.338 kg      Telemetry  Overnight telemetry shows sinus rhythm with heart rate in the 70s, which I personally reviewed.   ECG  The most recent ECG shows normal sinus rhythm, heart 72, LVH by voltage with repolarization abnormality noted, which I personally reviewed.   Physical Exam   Vitals:   04/19/20 0545 04/19/20 0600 04/19/20 0700 04/19/20 0715  BP: (!) 164/124 (!) 174/116 (!) 168/105 (!) 170/111  Pulse: 78 78 66 74  Resp: (!) 28 15 16 12   Temp:      TempSrc:      SpO2: 96% 97% 99% 99%  Weight:      Height:       No intake or output data in the 24 hours ending 04/19/20 0746  Last 3 Weights  04/18/2020 02/11/2020 12/29/2019  Weight (lbs) 218 lb 219 lb 219 lb  Weight (kg) 98.884 kg 99.338 kg 99.338 kg    Body mass index is 32.19 kg/m.  General: Well nourished, well developed, in no acute distress Head: Atraumatic, normal size  Eyes: PEERLA, EOMI  Neck: Supple, no JVD Endocrine: No thryomegaly Cardiac: Normal S1, S2; RRR; no murmurs, rubs, or gallops Lungs: Clear to auscultation bilaterally, no wheezing, rhonchi or rales  Abd: Soft, nontender, no hepatomegaly  Ext: No edema, pulses 2+ Musculoskeletal: No deformities, BUE and BLE strength normal and equal Skin: Warm and dry, no rashes   Neuro: Alert and oriented to person, place, time, and situation, CNII-XII grossly intact, no focal deficits  Psych: Normal mood and affect   Labs  High Sensitivity Troponin:   Recent Labs  Lab 04/18/20 1838 04/18/20 2342  TROPONINIHS 54* 47*     Cardiac EnzymesNo results for input(s): TROPONINI in the last 168 hours. No results for input(s): TROPIPOC in the last 168 hours.  Chemistry Recent Labs  Lab 04/18/20 1838  NA 138  K 3.9  CL 100  CO2 25  GLUCOSE 80  BUN 13  CREATININE 1.31*  CALCIUM 9.6  GFRNONAA >60  ANIONGAP 13    Hematology Recent Labs  Lab 04/18/20 1838  WBC 10.6*  RBC 5.08  HGB 14.8  HCT 45.9  MCV 90.4  MCH 29.1  MCHC 32.2  RDW 16.4*  PLT 218   BNPNo results for input(s): BNP, PROBNP in the last 168 hours.  DDimer No results for input(s): DDIMER in the last 168 hours.   Radiology  DG Chest 2 View  Result Date: 04/18/2020 CLINICAL DATA:  Chest pain EXAM: CHEST - 2 VIEW COMPARISON:  11/30/2017 FINDINGS: Lungs are clear. No pneumothorax or pleural effusion. Median sternotomy has been performed. Cardiac size is within normal limits. Pulmonary vascularity is normal. No acute bone abnormality. IMPRESSION: No active cardiopulmonary disease. Electronically Signed   By: Helyn Numbers MD   On: 04/18/2020 19:08    Cardiac Studies  LHC 07/16/2016  LM  lesion, 45 %stenosed. Does not compromise flow to the circumflex or ramus  Ost LAD lesion, 85 %stenosed.  The left ventricular systolic function is normal. The left ventricular ejection fraction is greater than 65% by visual estimate.  LV end diastolic pressure is severely elevated.  There is no aortic valve stenosis.    Patient Profile  Edward Farmer is a 59 y.o. male with CAD status post CABG, hypertension, hyperlipidemia, tobacco abuse who was admitted on 04/19/2020 with unstable angina.  Assessment & Plan   1.  Unstable angina -Known history of ostial LAD disease as well as 45% left main disease.  Underwent LIMA to LAD in 2018. -He presents with worsening chest pain occurring with exertion.  Also occurring at rest.  Lasting for several minutes.  Blood pressure also elevated. -Initial troponin 54 and 47 on repeat -EKG with LVH and deep T wave inversions anterolateral leads likely consistent with repolarization normality -I did review his left heart catheterization.  He had a 45% left main lesion in 2018.  He did undergo CABG with LIMA to the LAD.  I do have concerns that he has had disease progression in the left main.  This vessel was marginal in 2018.  Therefore, I have recommended left heart catheterization today.  He has received aspirin he is on a statin. -He will continue beta-blocker.  He is chest pain-free. -Repeat echo is pending. -A1c and lipid profile pending. -I did explain the risk and benefits of left heart catheterization, and the risks include bleeding, infection, damage to internal structures, myocardial infarction, stroke and/or death.  He does understand and and accept these risks.  He is willing to proceed with left heart catheterization today.  2.  Hypertensive crisis -We will go and start a nitro drip.  Continue Coreg 25 twice a day.  He is on amlodipine 10 mg a day.  I have ordered losartan 25 mg a day as well.  3.  Hyperlipidemia -Statin  4.  Tobacco  abuse -Smoking cigars.  Cessation advised.  5.  Obesity -Weight loss encouraged  FEN -Diet: N.p.o. -DVT PPx: Subcu heparin -Precath fluids -Code: Full  For questions or updates, please contact [facility] Please consult [email or web address] for contact info under   Time Spent with Patient: I have spent a total of 25 minutes with patient reviewing hospital notes, telemetry, EKGs, labs and examining the patient as well as establishing an assessment and plan that was discussed with the patient.  > 50% of time was spent in direct patient care.    Signed, Lenna Gilford. Flora Lipps, MD [facility]  [facility]  04/19/2020 7:46 AM

## 2020-04-19 NOTE — ED Provider Notes (Signed)
[facility]   CSN: [id number] Arrival date & time: 04/18/20  1740     History Chief Complaint  Patient presents with  . Chest Pain    Edward Farmer is a 59 y.o. male.  HPI     This is a 59 year old male with a history of hypertension, tobacco abuse, hyperlipidemia, coronary artery disease status post CABG in 2018 who presents with chest pain and shortness of breath.  Patient reports over the last week he has had some left-sided pressure-like chest pain that is gone into the left arm with some left arm numbness.  He states that the pain comes and goes.  It is related to exertion.  He has had associated shortness of breath.  No recent fevers or cough.  No leg swelling.  He reports pain is similar to when he had to have his CABG.  He is currently pain-free.  He continues to smoke occasionally.   Chart reviewed.  He is a patient of Dr. Allyson SabalBerry, cardiology and Dr. Delford FieldWright.  Last seen by cardiology in October 2021.  At that time he was asymptomatic.  Risk factor modification recommended to be continued.  He has not had any definitive testing since his CABG in 2018. Past Medical History:  Diagnosis Date  . Asthma   . Bradycardia 07/14/2016   on BB. Rate improved with discontinuation.   . Hypertension   . Tobacco abuse     Patient Active Problem List   Diagnosis Date Noted  . Hyperlipidemia 02/11/2020  . Prediabetes 02/10/2020  . 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 Essential hypertension 03/12/2018  . Tobacco dependence 03/12/2018  . Hx of CABG 07/17/2016  . Family history of premature CAD 07/16/2016  . Unstable angina (HCC) 07/14/2016  . Asthma, chronic 01/27/2014  . Obesity, unspecified 01/27/2014    Past Surgical History:  Procedure Laterality Date  . CORONARY ARTERY BYPASS GRAFT N/A 07/17/2016   Procedure: CORONARY ARTERY BYPASS GRAFTING (CABG) x 1. OFF  PUMP. LIMA TO LAD.;  Surgeon: Delight OvensEdward B [name], MD;  [facility];  Service: Open Heart Surgery;  Laterality: N/A;  . LEFT HEART CATH AND CORONARY ANGIOGRAPHY N/A 07/16/2016   Procedure: Left Heart Cath and Coronary Angiography;  Surgeon: Marykay Lexavid W Harding, MD;  [facility];  Service: Cardiovascular;  Laterality: N/A;  . TEE WITHOUT CARDIOVERSION N/A 07/17/2016   Procedure: TRANSESOPHAGEAL ECHOCARDIOGRAM (TEE);  Surgeon: Delight OvensEdward B [name], MD;  [facility];  Service: Open Heart Surgery;  Laterality: N/A;       Family History  Problem Relation Age of Onset  . Migraines Mother   . Heart attack Father     Social History   Tobacco Use  . Smoking status: Current Every Day Smoker    Packs/day: 0.25    Types: Cigars  . Smokeless tobacco: Never Used  . Tobacco comment: cutting back  Substance Use Topics  . Alcohol use: Yes    Alcohol/week: 0.0 standard drinks    Comment: occ  . Drug use: No    Home Medications Prior to Admission medications   Medication Sig Start Date End Date Taking? Authorizing Provider  amLODipine (NORVASC) 10 MG tablet Take 1 tablet (10 mg total) by mouth daily. 12/15/19  Yes Storm FriskWright, Patrick E, MD  aspirin EC 81 MG tablet Take 1 tablet (81 mg total) by mouth daily. Swallow whole. 12/30/19  Yes Shan LevansWright, Patrick  E, MD  atorvastatin (LIPITOR) 20 MG tablet Take 1 tablet (20 mg total) by mouth daily. 12/15/19  Yes [name], MD  carvedilol (COREG) 25 MG tablet Take 1 tablet (25 mg total) by mouth 2 (two) times daily with a meal. 12/15/19  Yes [name], MD  hydrochlorothiazide (HYDRODIURIL) 25 MG tablet Take 1 tablet (25 mg total) by mouth daily. 12/15/19  Yes [name], MD  nicotine (NICODERM CQ - DOSED IN MG/24 HOURS) 14 mg/24hr patch Place 1 patch (14 mg total) onto the skin daily. Patient not taking: Reported on 04/19/2020 12/29/19   [name], MD  triamcinolone cream (KENALOG) 0.1 % Apply 1 application topically 2  (two) times daily. Patient not taking: Reported on 04/19/2020 12/29/19   [name], MD    Allergies    Patient has no known allergies.  Review of Systems   Review of Systems  Constitutional: Negative for fever.  Respiratory: Positive for shortness of breath. Negative for cough.   Cardiovascular: Positive for chest pain.  Gastrointestinal: Negative for abdominal pain, nausea and vomiting.  Genitourinary: Negative for dysuria.  All other systems reviewed and are negative.   Physical Exam Updated Vital Signs BP (!) 168/122   Pulse 67   Temp 98.3 F (36.8 C) (Oral)   Resp 14   Ht 1.753 m (5\' 9" )   Wt 98.9 kg   SpO2 93%   BMI 32.19 kg/m   Physical Exam Vitals and nursing note reviewed.  Constitutional:      Appearance: He is well-developed and well-nourished. He is obese.  HENT:     Head: Normocephalic and atraumatic.  Eyes:     Pupils: Pupils are equal, round, and reactive to light.  Cardiovascular:     Rate and Rhythm: Normal rate and regular rhythm.     Heart sounds: Normal heart sounds. No murmur heard.     Comments: Midline sternotomy scar Pulmonary:     Effort: Pulmonary effort is normal. No respiratory distress.     Breath sounds: Normal breath sounds. No wheezing.  Abdominal:     General: Bowel sounds are normal.     Palpations: Abdomen is soft.     Tenderness: There is no abdominal tenderness. There is no rebound.  Musculoskeletal:        General: No edema.     Cervical back: Neck supple.     Comments: Trace bilateral lower extremity edema  Lymphadenopathy:     Cervical: No cervical adenopathy.  Skin:    General: Skin is warm and dry.  Neurological:     Mental Status: He is alert and oriented to person, place, and time.  Psychiatric:        Mood and Affect: Mood and affect and mood normal.     ED Results / Procedures / Treatments   Labs (all labs ordered are listed, but only abnormal results are displayed) Labs Reviewed  BASIC METABOLIC  PANEL - Abnormal; Notable for the following components:      Result Value   Creatinine, Ser 1.31 (*)    All other components within normal limits  CBC - Abnormal; Notable for the following components:   WBC 10.6 (*)    RDW 16.4 (*)    All other components within normal limits  TROPONIN I (HIGH SENSITIVITY) - Abnormal; Notable for the following components:   Troponin I (High Sensitivity) 54 (*)    All other components within normal limits  TROPONIN I (HIGH SENSITIVITY) -  Abnormal; Notable for the following components:   Troponin I (High Sensitivity) 47 (*)    All other components within normal limits  RESP PANEL BY RT-PCR (FLU A&B, COVID) ARPGX2  HIV ANTIBODY (ROUTINE TESTING W REFLEX)  CREATININE, SERUM  TROPONIN I (HIGH SENSITIVITY)    EKG EKG Interpretation  Date/Time:  Tuesday April 18 2020 17:55:25 EST Ventricular Rate:  84 PR Interval:  178 QRS Duration: 84 QT Interval:  386 QTC Calculation: 456 R Axis:   22 Text Interpretation: Normal sinus rhythm Possible Left atrial enlargement ST & T wave abnormality, consider inferior ischemia ST & T wave abnormality, consider anterolateral ischemia Abnormal ECG no acute change from prior ecg in 2019 Confirmed by Marianna Fuss ([id number]) on 04/18/2020 7:10:45 PM   Radiology DG Chest 2 View  Result Date: 04/18/2020 CLINICAL DATA:  Chest pain EXAM: CHEST - 2 VIEW COMPARISON:  11/30/2017 FINDINGS: Lungs are clear. No pneumothorax or pleural effusion. Median sternotomy has been performed. Cardiac size is within normal limits. Pulmonary vascularity is normal. No acute bone abnormality. IMPRESSION: No active cardiopulmonary disease. Electronically Signed   By: Helyn Numbers MD   On: 04/18/2020 19:08    Procedures Procedures (including critical care time)  Medications Ordered in ED Medications  aspirin EC tablet 81 mg (has no administration in time range)  nitroGLYCERIN (NITROSTAT) SL tablet 0.4 mg (has no administration in time  range)  acetaminophen (TYLENOL) tablet 650 mg (has no administration in time range)  ondansetron (ZOFRAN) injection 4 mg (has no administration in time range)  heparin injection 5,000 Units (has no administration in time range)  amLODipine (NORVASC) tablet 10 mg (has no administration in time range)  atorvastatin (LIPITOR) tablet 20 mg (has no administration in time range)  carvedilol (COREG) tablet 25 mg (has no administration in time range)  aspirin chewable tablet 324 mg (324 mg Oral Given 04/19/20 0426)    ED Course  I have reviewed the triage vital signs and the nursing notes.  Pertinent labs & imaging results that were available during my care of the patient were reviewed by me and considered in my medical decision making (see chart for details).  Clinical Course as of 04/19/20 0556  Wed Apr 19, 2020  0425 Spoke with Dr. Deforest Hoyles, cardiology.  He will evaluation patient. [CH]    Clinical Course User Index [CH] Azizi Bally, Mayer Masker, MD   MDM Rules/Calculators/A&P                           Patient presents with 1 week history of worsening chest pain or shortness of breath on exertion.  History of coronary artery disease and CABG.  EKG obtained in triage and shows stable T wave inversions inferiorly and laterally which are unchanged from prior.  No acute ST elevation.  Initial troponins are slightly elevated without any prior for comparison.  First 54 and repeat 47.  He is not having any ongoing pain at this time.  Chest x-ray shows no evidence of pneumothorax, pneumonia, pulmonary edema.  Have low suspicion at this time for PE.  Given history, high suspicion for unstable angina as cause of the patient symptoms.  Cardiology was consulted given that he is followed by Dr. Gery Pray.  They will evaluate the patient.  Patient was given full dose aspirin.  Given that he is pain-free, will hold off on heparin and nitroglycerin until evaluation.  6:00 AM Cardiology to admit to medicine  service.  Final  Clinical Impression(s) / ED Diagnoses Final diagnoses:  Stable angina [facility])    Rx / DC Orders ED Discharge Orders    None       Jamyson Jirak, Mayer Masker, MD 04/19/20 0600

## 2020-04-19 NOTE — ED Notes (Signed)
Received from lobby per wheelchair at this time.

## 2020-04-19 NOTE — Interval H&P Note (Signed)
Cath Lab Visit (complete for each Cath Lab visit)  Clinical Evaluation Leading to the Procedure:   ACS: No.  Non-ACS:    Anginal Classification: CCS III  Anti-ischemic medical therapy: Maximal Therapy (2 or more classes of medications)  Non-Invasive Test Results: No non-invasive testing performed  Prior CABG: Previous CABG      History and Physical Interval Note:  04/19/2020 3:09 PM  Edward Farmer  has presented today for surgery, with the diagnosis of unstable angina.  The various methods of treatment have been discussed with the patient and family. After consideration of risks, benefits and other options for treatment, the patient has consented to  Procedure(s): LEFT HEART CATH AND CORONARY ANGIOGRAPHY (N/A) as a surgical intervention.  The patient's history has been reviewed, patient examined, no change in status, stable for surgery.  I have reviewed the patient's chart and labs.  Questions were answered to the patient's satisfaction.     Nicki Guadalajara

## 2020-04-19 NOTE — Progress Notes (Signed)
Cardiology Progress Note  Patient ID: Edward Farmer MRN: [id number] DOB: [birth year] Date of Encounter: 04/19/2020  Primary Cardiologist: No primary care provider on file.  Subjective   Chief Complaint: Chest pain  HPI: Intermittent chest pain similar to prior episodes of angina.  Blood pressure elevated.  Troponins are flat but mildly elevated.  No further chest pain.  ROS:  All other ROS reviewed and negative. Pertinent positives noted in the HPI.     Inpatient Medications  Scheduled Meds: . amLODipine  10 mg Oral Daily  . [START ON 04/20/2020] aspirin EC  81 mg Oral Daily  . atorvastatin  20 mg Oral q1800  . carvedilol  25 mg Oral BID WC  . heparin  5,000 Units Subcutaneous Q8H  . losartan  50 mg Oral Daily   Continuous Infusions: . nitroGLYCERIN     PRN Meds: acetaminophen, hydrALAZINE, nitroGLYCERIN, ondansetron (ZOFRAN) IV   Vital Signs   Vitals:   04/19/20 0545 04/19/20 0600 04/19/20 0700 04/19/20 0715  BP: (!) 164/124 (!) 174/116 (!) 168/105 (!) 170/111  Pulse: 78 78 66 74  Resp: (!) 28 15 16 12   Temp:      TempSrc:      SpO2: 96% 97% 99% 99%  Weight:      Height:       No intake or output data in the 24 hours ending 04/19/20 0746 Last 3 Weights 04/18/2020 02/11/2020 12/29/2019  Weight (lbs) 218 lb 219 lb 219 lb  Weight (kg) 98.884 kg 99.338 kg 99.338 kg      Telemetry  Overnight telemetry shows sinus rhythm with heart rate in the 70s, which I personally reviewed.   ECG  The most recent ECG shows normal sinus rhythm, heart 72, LVH by voltage with repolarization abnormality noted, which I personally reviewed.   Physical Exam   Vitals:   04/19/20 0545 04/19/20 0600 04/19/20 0700 04/19/20 0715  BP: (!) 164/124 (!) 174/116 (!) 168/105 (!) 170/111  Pulse: 78 78 66 74  Resp: (!) 28 15 16 12   Temp:      TempSrc:      SpO2: 96% 97% 99% 99%  Weight:      Height:       No intake or output data in the 24 hours ending 04/19/20 0746  Last 3 Weights  04/18/2020 02/11/2020 12/29/2019  Weight (lbs) 218 lb 219 lb 219 lb  Weight (kg) 98.884 kg 99.338 kg 99.338 kg    Body mass index is 32.19 kg/m.  General: Well nourished, well developed, in no acute distress Head: Atraumatic, normal size  Eyes: PEERLA, EOMI  Neck: Supple, no JVD Endocrine: No thryomegaly Cardiac: Normal S1, S2; RRR; no murmurs, rubs, or gallops Lungs: Clear to auscultation bilaterally, no wheezing, rhonchi or rales  Abd: Soft, nontender, no hepatomegaly  Ext: No edema, pulses 2+ Musculoskeletal: No deformities, BUE and BLE strength normal and equal Skin: Warm and dry, no rashes   Neuro: Alert and oriented to person, place, time, and situation, CNII-XII grossly intact, no focal deficits  Psych: Normal mood and affect   Labs  High Sensitivity Troponin:   Recent Labs  Lab 04/18/20 1838 04/18/20 2342  TROPONINIHS 54* 47*     Cardiac EnzymesNo results for input(s): TROPONINI in the last 168 hours. No results for input(s): TROPIPOC in the last 168 hours.  Chemistry Recent Labs  Lab 04/18/20 1838  NA 138  K 3.9  CL 100  CO2 25  GLUCOSE 80  BUN 13  CREATININE 1.31*  CALCIUM 9.6  GFRNONAA >60  ANIONGAP 13    Hematology Recent Labs  Lab 04/18/20 1838  WBC 10.6*  RBC 5.08  HGB 14.8  HCT 45.9  MCV 90.4  MCH 29.1  MCHC 32.2  RDW 16.4*  PLT 218   BNPNo results for input(s): BNP, PROBNP in the last 168 hours.  DDimer No results for input(s): DDIMER in the last 168 hours.   Radiology  DG Chest 2 View  Result Date: 04/18/2020 CLINICAL DATA:  Chest pain EXAM: CHEST - 2 VIEW COMPARISON:  11/30/2017 FINDINGS: Lungs are clear. No pneumothorax or pleural effusion. Median sternotomy has been performed. Cardiac size is within normal limits. Pulmonary vascularity is normal. No acute bone abnormality. IMPRESSION: No active cardiopulmonary disease. Electronically Signed   By: Helyn Numbers MD   On: 04/18/2020 19:08    Cardiac Studies  LHC 07/16/2016  LM  lesion, 45 %stenosed. Does not compromise flow to the circumflex or ramus  Ost LAD lesion, 85 %stenosed.  The left ventricular systolic function is normal. The left ventricular ejection fraction is greater than 65% by visual estimate.  LV end diastolic pressure is severely elevated.  There is no aortic valve stenosis.    Patient Profile  Edward Farmer is a 59 y.o. male with CAD status post CABG, hypertension, hyperlipidemia, tobacco abuse who was admitted on 04/19/2020 with unstable angina.  Assessment & Plan   1.  Unstable angina -Known history of ostial LAD disease as well as 45% left main disease.  Underwent LIMA to LAD in 2018. -He presents with worsening chest pain occurring with exertion.  Also occurring at rest.  Lasting for several minutes.  Blood pressure also elevated. -Initial troponin 54 and 47 on repeat -EKG with LVH and deep T wave inversions anterolateral leads likely consistent with repolarization normality -I did review his left heart catheterization.  He had a 45% left main lesion in 2018.  He did undergo CABG with LIMA to the LAD.  I do have concerns that he has had disease progression in the left main.  This vessel was marginal in 2018.  Therefore, I have recommended left heart catheterization today.  He has received aspirin he is on a statin. -He will continue beta-blocker.  He is chest pain-free. -Repeat echo is pending. -A1c and lipid profile pending. -I did explain the risk and benefits of left heart catheterization, and the risks include bleeding, infection, damage to internal structures, myocardial infarction, stroke and/or death.  He does understand and and accept these risks.  He is willing to proceed with left heart catheterization today.  2.  Hypertensive crisis -We will go and start a nitro drip.  Continue Coreg 25 twice a day.  He is on amlodipine 10 mg a day.  I have ordered losartan 25 mg a day as well.  3.  Hyperlipidemia -Statin  4.  Tobacco  abuse -Smoking cigars.  Cessation advised.  5.  Obesity -Weight loss encouraged  FEN -Diet: N.p.o. -DVT PPx: Subcu heparin -Precath fluids -Code: Full  For questions or updates, please contact [facility] Please consult [email or web address] for contact info under   Time Spent with Patient: I have spent a total of 25 minutes with patient reviewing hospital notes, telemetry, EKGs, labs and examining the patient as well as establishing an assessment and plan that was discussed with the patient.  > 50% of time was spent in direct patient care.    Signed, Lenna Gilford. Flora Lipps, MD [facility]  [facility]  04/19/2020 7:46 AM

## 2020-04-19 NOTE — H&P (Signed)
Cardiology Admission History and Physical:   Patient ID: Edward Farmer; MRN: [id number]; DOB: 1960/06/05   Admission date: 04/18/2020  Primary Care Provider: [name], MD Primary Cardiologist: [name], MD   Chief Complaint:  Chest pain  History of Present Illness:   Edward Farmer is a 59 y.o. male with a history of coronary artery disease, hypertension, hyperlipidemia, and nicotine dependence, who was admitted to the hospital with episodic chest pain for about 1 week. He had a cardiac catheterization procedure performed by Dr. Herbie Baltimore in the setting of unstable angina on 07/16/2016 revealing high-grade ostial LAD disease and a left dominant system.  The following day he underwent CABG x1 by Dr. Cornelius Moras with a LIMA to his LAD done off-pump. He was doing well till recently. Over the past week or so he has been experiencing left sided chest pain along with left arm numbness. The pain is provoked by exertion and relieved with rest. There is some associated SOB and nausea as well.  His vitals were: BP 183/113 mmHg with a heart rate in the 60s to 80s. The high sensitivity troponins were 54 and 47. Other labs were: K 3.9, BUN 13, Cr 1.31, WBC 10.6, Hgb 14.8 and plt 218. The CXR was unremarkable. The ECG showed NSR, rate 84 bpm, LVH and repolarization abnormalities.    Past Medical History:  Diagnosis Date  . Asthma   . Bradycardia 07/14/2016   on BB. Rate improved with discontinuation.   . Hypertension   . Tobacco abuse     Past Surgical History:  Procedure Laterality Date  . CORONARY ARTERY BYPASS GRAFT N/A 07/17/2016   Procedure: CORONARY ARTERY BYPASS GRAFTING (CABG) x 1. OFF PUMP. LIMA TO LAD.;  Surgeon: Delight Ovens, MD;  Location: [facility] OR;  Service: Open Heart Surgery;  Laterality: N/A;  . LEFT HEART CATH AND CORONARY ANGIOGRAPHY N/A 07/16/2016   Procedure: Left Heart Cath and Coronary Angiography;  Surgeon: Marykay Lex, MD;  Location: [facility] INVASIVE CV LAB;  Service:  Cardiovascular;  Laterality: N/A;  . TEE WITHOUT CARDIOVERSION N/A 07/17/2016   Procedure: TRANSESOPHAGEAL ECHOCARDIOGRAM (TEE);  Surgeon: Delight Ovens, MD;  Location: [facility] OR;  Service: Open Heart Surgery;  Laterality: N/A;     Medications Prior to Admission: Prior to Admission medications   Medication Sig Start Date End Date Taking? Authorizing Provider  amLODipine (NORVASC) 10 MG tablet Take 1 tablet (10 mg total) by mouth daily. 12/15/19   [name], MD  aspirin EC 81 MG tablet Take 1 tablet (81 mg total) by mouth daily. Swallow whole. 12/30/19   [name], MD  atorvastatin (LIPITOR) 20 MG tablet Take 1 tablet (20 mg total) by mouth daily. 12/15/19   [name], MD  carvedilol (COREG) 25 MG tablet Take 1 tablet (25 mg total) by mouth 2 (two) times daily with a meal. 12/15/19   [name], MD  hydrochlorothiazide (HYDRODIURIL) 25 MG tablet Take 1 tablet (25 mg total) by mouth daily. 12/15/19   [name], MD  nicotine (NICODERM CQ - DOSED IN MG/24 HOURS) 14 mg/24hr patch Place 1 patch (14 mg total) onto the skin daily. 12/29/19   [name], MD  triamcinolone cream (KENALOG) 0.1 % Apply 1 application topically 2 (two) times daily. 12/29/19   [name], MD     Allergies:   No Known Allergies  Social History:   Social History   Socioeconomic History  . Marital status: Legally  Separated    Spouse name: Not on file  . Number of children: Not on file  . Years of education: Not on file  . Highest education level: Not on file  Occupational History  . Not on file  Tobacco Use  . Smoking status: Current Every Day Smoker    Packs/day: 0.25    Types: Cigars  . Smokeless tobacco: Never Used  . Tobacco comment: cutting back  Substance and Sexual Activity  . Alcohol use: Yes    Alcohol/week: 0.0 standard drinks    Comment: occ  . Drug use: No  . Sexual activity: Not on file  Other Topics Concern  . Not on file  Social History Narrative   . Not on file   Social Determinants of Health   Financial Resource Strain: Not on file  Food Insecurity: Not on file  Transportation Needs: Not on file  Physical Activity: Not on file  Stress: Not on file  Social Connections: Not on file  Intimate Partner Violence: Not on file     Family History:   The patient's family history includes Heart attack in his father; Migraines in his mother.     Review of Systems: [y] = yes, [ ]  = no   . General: Weight gain [ ] ; Weight loss [ ] ; Anorexia [ ] ; Fatigue [ ] ; Fever [ ] ; Chills [ ] ; Weakness [ ]   . Cardiac: Chest pain/pressure [Y]; Resting SOB [ ] ; Exertional SOB [Y]; Orthopnea [ ] ; Pedal Edema [ ] ; Palpitations [ ] ; Syncope [ ] ; Presyncope [ ] ; Paroxysmal nocturnal dyspnea[ ]   . Pulmonary: Cough [ ] ; Wheezing[ ] ; Hemoptysis[ ] ; Sputum [ ] ; Snoring [ ]   . GI: Vomiting[ ] ; Dysphagia[ ] ; Melena[ ] ; Hematochezia [ ] ; Heartburn[ ] ; Abdominal pain [ ] ; Constipation [ ] ; Diarrhea [ ] ; BRBPR [ ]   . GU: Hematuria[ ] ; Dysuria [ ] ; Nocturia[ ]   . Vascular: Pain in legs with walking [ ] ; Pain in feet with lying flat [ ] ; Non-healing sores [ ] ; Stroke [ ] ; TIA [ ] ; Slurred speech [ ] ;  . Neuro: Headaches[ ] ; Vertigo[ ] ; Seizures[ ] ; Paresthesias[ ] ;Blurred vision [ ] ; Diplopia [ ] ; Vision changes [ ]   . Ortho/Skin: Arthritis [ ] ; Joint pain [ ] ; Muscle pain [ ] ; Joint swelling [ ] ; Back Pain [ ] ; Rash [ ]   . Psych: Depression[ ] ; Anxiety[ ]   . Heme: Bleeding problems [ ] ; Clotting disorders [ ] ; Anemia [ ]   . Endocrine: Diabetes [ ] ; Thyroid dysfunction[ ]      Physical Exam/Data:   Vitals:   04/19/20 0031 04/19/20 0305 04/19/20 0348 04/19/20 0415  BP: (!) 164/101 (!) 183/113 (!) 163/125 (!) 153/99  Pulse: 73 66 74 60  Resp: 18 18 15 12   Temp: 97.9 F (36.6 C)  98.3 F (36.8 C)   TempSrc:   Oral   SpO2: 98% 99% 94% 98%  Weight:      Height:       No intake or output data in the 24 hours ending 04/19/20 0455 Filed Weights   04/18/20 1805   Weight: 98.9 kg   Body mass index is 32.19 kg/m.  General:  Well nourished, well developed, in no acute distress HEENT: normal Lymph: no adenopathy Neck: no JVD Endocrine:  No thryomegaly Vascular: No carotid bruits; FA pulses 2+ bilaterally without bruits  Cardiac:  normal S1, S2; RRR; no murmur  Lungs:  clear to auscultation bilaterally, no wheezing, rhonchi or rales  Abd:  soft, nontender, no hepatomegaly  Ext: no edema Musculoskeletal:  No deformities, BUE and BLE strength normal and equal Skin: warm and dry  Neuro:  CNs 2-12 intact, no focal abnormalities noted Psych:  Normal affect    Laboratory Data:  Chemistry Recent Labs  Lab 04/18/20 1838  NA 138  K 3.9  CL 100  CO2 25  GLUCOSE 80  BUN 13  CREATININE 1.31*  CALCIUM 9.6  GFRNONAA >60  ANIONGAP 13    No results for input(s): PROT, ALBUMIN, AST, ALT, ALKPHOS, BILITOT in the last 168 hours. Hematology Recent Labs  Lab 04/18/20 1838  WBC 10.6*  RBC 5.08  HGB 14.8  HCT 45.9  MCV 90.4  MCH 29.1  MCHC 32.2  RDW 16.4*  PLT 218   Cardiac EnzymesNo results for input(s): TROPONINI in the last 168 hours. No results for input(s): TROPIPOC in the last 168 hours.  BNPNo results for input(s): BNP, PROBNP in the last 168 hours.  DDimer No results for input(s): DDIMER in the last 168 hours.  Radiology/Studies:  DG Chest 2 View  Result Date: 04/18/2020 CLINICAL DATA:  Chest pain EXAM: CHEST - 2 VIEW COMPARISON:  11/30/2017 FINDINGS: Lungs are clear. No pneumothorax or pleural effusion. Median sternotomy has been performed. Cardiac size is within normal limits. Pulmonary vascularity is normal. No acute bone abnormality. IMPRESSION: No active cardiopulmonary disease. Electronically Signed   By: Helyn Numbers MD   On: 04/18/2020 19:08    Assessment and Plan:   1.  Crescendo angina  The patient has been having worsening chest pain symptoms over the past few days. In the hospital he was noted to be hypertensive  (upto 183/113 mmHg). The ECG does show LVH with repol abnormalities. The hs-troponins are abnormal but flat. The patient will be admitted to the hospital for ischemic workup given his previous history of CAD.  -Continue to trend cardiac enzymes -serial ECGs -Continue ASA -Keep patient NPO -Will hold-off on IV heparin for now -Continue atorvastatin, check a lipid panel -Continue amlodipine, carvedilol and HCTZ    Severity of Illness: The appropriate patient status for this patient is INPATIENT. Inpatient status is judged to be reasonable and necessary in order to provide the required intensity of service to ensure the patient's safety. The patient's presenting symptoms, physical exam findings, and initial radiographic and laboratory data in the context of their chronic comorbidities is felt to place them at high risk for further clinical deterioration. Furthermore, it is not anticipated that the patient will be medically stable for discharge from the hospital within 2 midnights of admission. The following factors support the patient status of inpatient.   " The patient's presenting symptoms include chest pain. " The worrisome physical exam findings include NA. " The initial radiographic and laboratory data are worrisome because of abnormal ECG. " The chronic co-morbidities include HTN.   * I certify that at the point of admission it is my clinical judgment that the patient will require inpatient hospital care spanning beyond 2 midnights from the point of admission due to high intensity of service, high risk for further deterioration and high frequency of surveillance required.*    For questions or updates, please contact [facility] Please consult [email or web address] for contact info under Cardiology/STEMI.    Signed, Lonie Peak, MD  04/19/2020 4:55 AM

## 2020-04-19 NOTE — Progress Notes (Signed)
Site Area: right femoral  Site Prior to Removal: Level 0 Pressure Applied For:  20   minutes Manual: yes Patient Status During Pull: stable Post Pull Site: Level 0 Post Pull Instructions Given: YES Post Pull Pulses Present: palpable Dressing Applied: gauze and tegaderm Bedrest begins @ 1645 till 2045 Comments:  Removed by  Griffith Citron, RN, RCIS

## 2020-04-20 ENCOUNTER — Inpatient Hospital Stay (HOSPITAL_COMMUNITY): Payer: Medicaid Other

## 2020-04-20 ENCOUNTER — Encounter (HOSPITAL_COMMUNITY): Payer: Self-pay | Admitting: Cardiovascular Disease

## 2020-04-20 DIAGNOSIS — R079 Chest pain, unspecified: Secondary | ICD-10-CM

## 2020-04-20 LAB — BASIC METABOLIC PANEL
Anion gap: 9 (ref 5–15)
BUN: 11 mg/dL (ref 6–20)
CO2: 20 mmol/L — ABNORMAL LOW (ref 22–32)
Calcium: 8.8 mg/dL — ABNORMAL LOW (ref 8.9–10.3)
Chloride: 106 mmol/L (ref 98–111)
Creatinine, Ser: 1.09 mg/dL (ref 0.61–1.24)
GFR, Estimated: >60 mL/min (ref >60)
Glucose, Bld: 98 mg/dL (ref 70–99)
Potassium: 4.2 mmol/L (ref 3.5–5.1)
Sodium: 135 mmol/L (ref 135–145)

## 2020-04-20 LAB — CBC
HCT: 42.4 % (ref 39.0–52.0)
Hemoglobin: 14.1 g/dL (ref 13.0–17.0)
MCH: 29.3 pg (ref 26.0–34.0)
MCHC: 33.3 g/dL (ref 30.0–36.0)
MCV: 88 fL (ref 80.0–100.0)
Platelets: 204 10*3/uL (ref 150–400)
RBC: 4.82 MIL/uL (ref 4.22–5.81)
RDW: 16.1 % — ABNORMAL HIGH (ref 11.5–15.5)
WBC: 8.3 10*3/uL (ref 4.0–10.5)
nRBC: 0 % (ref 0.0–0.2)

## 2020-04-20 LAB — ECHOCARDIOGRAM COMPLETE
Area-P 1/2: 3.42 cm2
Calc EF: 49 %
Height: 70 in
S' Lateral: 3.5 cm
Single Plane A2C EF: 49.6 %
Single Plane A4C EF: 55.5 %
Weight: 3472 oz

## 2020-04-20 MED ORDER — ATORVASTATIN CALCIUM 80 MG PO TABS
80.0000 mg | ORAL_TABLET | Freq: Every day | ORAL | Status: DC
  Administered 2020-04-20: 18:00:00 80 mg via ORAL
  Filled 2020-04-20: qty 1

## 2020-04-20 MED ORDER — LOSARTAN POTASSIUM 50 MG PO TABS: 100.0000 mg | ORAL_TABLET | Freq: Every day | ORAL | Status: DC

## 2020-04-20 MED ORDER — LOSARTAN POTASSIUM 50 MG PO TABS
50.0000 mg | ORAL_TABLET | Freq: Every day | ORAL | Status: DC
Start: 2020-04-20 — End: 2020-04-21
  Administered 2020-04-20 – 2020-04-21 (×2): 50 mg via ORAL
  Filled 2020-04-20 (×2): qty 1

## 2020-04-20 MED ORDER — PERFLUTREN LIPID MICROSPHERE
1.0000 mL | INTRAVENOUS | Status: AC | PRN
  Administered 2020-04-20: 2 mL via INTRAVENOUS
  Filled 2020-04-20: qty 10

## 2020-04-20 MED ORDER — FUROSEMIDE 10 MG/ML IJ SOLN
80.0000 mg | Freq: Once | INTRAMUSCULAR | Status: AC
  Administered 2020-04-20: 08:00:00 80 mg via INTRAVENOUS
  Filled 2020-04-20: qty 8

## 2020-04-20 MED FILL — Verapamil HCl IV Soln 2.5 MG/ML: INTRAVENOUS | Qty: 2 | Status: AC

## 2020-04-20 NOTE — TOC Progression Note (Signed)
Transition of Care [facility]) - Progression Note    Patient Details  Name: HONEST VANLEER MRN: [id number] Date of Birth: 09-27-1960  Transition of Care [facility] CM/SW Contact  Leone Haven, RN Phone Number: 04/20/2020, 1:14 PM  Clinical Narrative:    NCM was informed by [place] with [facility], patient states he can not go back to where he was living .  NCM went to speak with patient, he states his friend called him an hour ago and told him he could not come back to live with him.  [facility] and uses the [employer] for transportation.  He states he will not be able to pay for his medications at dc.  He also states he has been trying to reach [facility] at the [facility] but she is not answering, her phone is [phone].  He receives his mail at the [facility], which is the address on file.  NCM contacted [employer] at the [employer], she states she thought they had found housing for him in the community.  Lawanna Kobus states he was at boarding house with [employer]".  [phone].  NCM spoke with Jonny Ruiz to see if patient could go back there tomorrow at discharge.  John states he will need to speak with Patient's Case Manager at the [employer].  He did speak with Tammy Sours at the [employer].  Per Lawanna Kobus they will pay a prorated price for patient to go back there thru the end of December but as of Jan 1, the patient will have to pay the rent himself.  This NCM communicated this to the patient,he states he understands and will be able to pay on Jan 1.          Expected Discharge Plan and Services                                                 Social Determinants of Health (SDOH) Interventions    Readmission Risk Interventions No flowsheet data found.

## 2020-04-20 NOTE — Consult Note (Signed)
   [facility] Inpatient Consult   04/20/2020  [name] 13-Jul-1960 [id number]   Managed Medicaid:    Patient discussed in progression meeting for potential community follow up on smoking cessation and cardiac disease.  11:15 Went by to speak with patient and he is currently sound asleep. 12:45 pm - went by and patient sitting in chair, explained my role in referral to assist with post hospital chronic care management with his Managed Medicaid.  Patient verbalized understand and spoke with his regarding needs for chronic disease follow up as well as smoking cessation.  Patient states I am interested in this cause I need to make some changes. Expressed to the patient I can make referral and he endorses Dr. Shan Levans as his primary care provider.  Patient also expressed that he is having housing issues he just found out,   Plan: Will follow up with inpatient [facility] team regarding outcome of conversation and for housing issue concerns.  Will follow up for Managed Medicaid team for post hospital needs.   PCP: Patient is active with [facility] [facility]. This provider does the transition of care follow up calls and appointment.  For questions, please contact:  Edward Farmer, Edward Farmer [facility]  [phone] business mobile phone Toll free office [phone]  Fax number: [phone] [email or web address]

## 2020-04-20 NOTE — Progress Notes (Signed)
  Echocardiogram 2D Echocardiogram has been performed.  Edward Farmer Swaim 04/20/2020, 10:27 AM

## 2020-04-20 NOTE — Progress Notes (Signed)
Progress Note  Patient Name: Edward Farmer Date of Encounter: 04/20/2020  [facility] Cardiologist: [name], MD   Subjective   Patient denies recurrent chest pain, though hasn't ambulated today and reports pain would only occur with ambulation. He appears mildly short of breath. No complaints of palpitations or LE edema.   Inpatient Medications    Scheduled Meds: . amLODipine  10 mg Oral Daily  . aspirin  81 mg Oral Daily  . atorvastatin  20 mg Oral q1800  . carvedilol  25 mg Oral BID WC  . heparin  5,000 Units Subcutaneous Q8H  . losartan  50 mg Oral Daily  . sodium chloride flush  3 mL Intravenous Q12H   Continuous Infusions: . sodium chloride     PRN Meds: sodium chloride, acetaminophen, diazepam, hydrALAZINE, nitroGLYCERIN, ondansetron (ZOFRAN) IV, sodium chloride flush   Vital Signs    Vitals:   04/19/20 1947 04/19/20 2246 04/20/20 0502 04/20/20 0810  BP:    (!) 138/91  Pulse:    79  Resp:    15  Temp: 97.6 F (36.4 C) 98.2 F (36.8 C) 97.8 F (36.6 C) 97.8 F (36.6 C)  TempSrc: Oral Oral Oral Oral  SpO2:    92%  Weight:   98.4 kg   Height:        Intake/Output Summary (Last 24 hours) at 04/20/2020 0848 Last data filed at 04/20/2020 0810 Gross per 24 hour  Intake 416.36 ml  Output 1225 ml  Net -808.64 ml   Last 3 Weights 04/20/2020 04/19/2020 04/18/2020  Weight (lbs) 217 lb 219 lb 9.3 oz 218 lb  Weight (kg) 98.431 kg 99.6 kg 98.884 kg      Telemetry    Sinus rhythm with occasional PVCs - Personally Reviewed  ECG    Sinus rhythm with rate 76bpm, chronic anterior and inferolateral TWI's, no STE/D  - Personally Reviewed  Physical Exam   GEN: No acute distress.  Neck: No JVD Cardiac: RRR, no murmurs, rubs, or gallops. Right groin cath site without bleeding or hematoma Respiratory: Appears mildly short of breath. Clear to auscultation bilaterally. GI: Soft, nontender, non-distended  MS: No edema; No deformity. Neuro:  Nonfocal   Psych: Normal affect   Labs    High Sensitivity Troponin:   Recent Labs  Lab 04/18/20 1838 04/18/20 2342 04/19/20 0625 04/19/20 1046  TROPONINIHS 54* 47* 36* 34*      Chemistry Recent Labs  Lab 04/18/20 1838 04/19/20 0625 04/19/20 1046 04/20/20 0224  NA 138  --  133* 135  K 3.9  --  4.2 4.2  CL 100  --  103 106  CO2 25  --  21* 20*  GLUCOSE 80  --  115* 98  BUN 13  --  12 11  CREATININE 1.31* 1.08 1.10 1.09  CALCIUM 9.6  --  8.7* 8.8*  GFRNONAA >60 >60 >60 >60  ANIONGAP 13  --  9 9     Hematology Recent Labs  Lab 04/18/20 1838 04/19/20 0826 04/20/20 0224  WBC 10.6* 8.8 8.3  RBC 5.08 5.04 4.82  HGB 14.8 14.4 14.1  HCT 45.9 46.2 42.4  MCV 90.4 91.7 88.0  MCH 29.1 28.6 29.3  MCHC 32.2 31.2 33.3  RDW 16.4* 16.5* 16.1*  PLT 218 210 204    BNP Recent Labs  Lab 04/19/20 1831  BNP 32.5     DDimer No results for input(s): DDIMER in the last 168 hours.   Radiology    DG  Chest 2 View  Result Date: 04/18/2020 CLINICAL DATA:  Chest pain EXAM: CHEST - 2 VIEW COMPARISON:  11/30/2017 FINDINGS: Lungs are clear. No pneumothorax or pleural effusion. Median sternotomy has been performed. Cardiac size is within normal limits. Pulmonary vascularity is normal. No acute bone abnormality. IMPRESSION: No active cardiopulmonary disease. Electronically Signed   By: Helyn Numbers MD   On: 04/18/2020 19:08   CARDIAC CATHETERIZATION  Result Date: 04/19/2020  Prox RCA-1 lesion is 70% stenosed.  Prox RCA-2 lesion is 70% stenosed.  Mid RCA lesion is 70% stenosed.  RV Branch lesion is 50% stenosed.  Ost LAD lesion is 80% stenosed.  Mid LM to Dist LM lesion is 45% stenosed.  1st LPL lesion is 30% stenosed.  1st Diag lesion is 30% stenosed.  The present study demonstrates no significant change in the previous 45 - 50% distal left main stenosis. The LAD ostium has 80% stenosis.  There is mild 30% narrowing in the first diagonal branch.  Competitive filling is present in the  mid LAD due to LIMA flow. There was a large ramus immediate vessel with significant tortuosity without significant stenoses. The left circumflex vessel is a dominant vessel which has mild luminal irregularity.  There is 30% stenosis in the distal marginal branch. The RCA is nondominant but diffusely diseased with stenoses of 70% in the proximal mid and mid distal segment with 70% marginal stenoses. Patent LIMA to LAD. Mild LV dysfunction with EF estimated in the 45 to 50% range.  Appears to be mild anterolateral hypocontractility.  LVEDP is significantly elevated at 30 to 32 mm.  There appears to be 2+ angiographic mitral regurgitation. RECOMMENDATION: Increase medical therapy for underlying CAD.  Diuresis with significant LVEDP elevation.  2D echo Doppler study to further assess systolic and diastolic function and mitral regurgitation.  Smoking cessation is imperative.  Aggressive blood pressure control and lipid management.    Cardiac Studies   Left heart catheterization 04/19/20:  Prox RCA-1 lesion is 70% stenosed.  Prox RCA-2 lesion is 70% stenosed.  Mid RCA lesion is 70% stenosed.  RV Branch lesion is 50% stenosed.  Ost LAD lesion is 80% stenosed.  Mid LM to Dist LM lesion is 45% stenosed.  1st LPL lesion is 30% stenosed.  1st Diag lesion is 30% stenosed.   The present study demonstrates no significant change in the previous 45 - 50% distal left main stenosis.  The LAD ostium has 80% stenosis.  There is mild 30% narrowing in the first diagonal branch.  Competitive filling is present in the mid LAD due to LIMA flow.  There was a large ramus immediate vessel with significant tortuosity without significant stenoses.  The left circumflex vessel is a dominant vessel which has mild luminal irregularity.  There is 30% stenosis in the distal marginal branch.  The RCA is nondominant but diffusely diseased with stenoses of 70% in the proximal mid and mid distal segment with 70% marginal  stenoses.  Patent LIMA to LAD.  Mild LV dysfunction with EF estimated in the 45 to 50% range.  Appears to be mild anterolateral hypocontractility.  LVEDP is significantly elevated at 30 to 32 mm.  There appears to be 2+ angiographic mitral regurgitation.  RECOMMENDATION: Increase medical therapy for underlying CAD.  Diuresis with significant LVEDP elevation.  2D echo Doppler study to further assess systolic and diastolic function and mitral regurgitation.  Smoking cessation is imperative.  Aggressive blood pressure control and lipid management.  Diagnostic Dominance: Left  Echocardiogram: pending Patient Profile     59 y.o. 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  Assessment & Plan    1. Unstable angina in patient with CAD s/p CABG in 2018: patient presented with exertional chest pain, though also occurring at rest at times. BP significantly elevated on admission. HsTrop peaked at 54 and trended down. EKG with LVH and anterolateral TWI c/w repolarization abnormality. He was recommended for a heart catheterization to further evaluate symptoms. He underwent LHC 04/19/20 which showed severe native multivessel disease with 80% ostial LAD and 70% stenosis scattered throughout the RCA (2 proximal lesions and 1 mRCA), as well as 45% LM and 30% D1 and 1st LPL stenosis with patent LIMA to LAD. LVEDP was noted to be significantly elevated with moderate MR noted. He was recommended to aggressive risk factor modifications including smoking cessation and BP/lipid control.  - Continue aspirin and statin - Continue BBlocker  2. Elevated LVEDP: noted on cath yesterday. Echo pending today. He was given IV lasix 80mg  this morning with good UOP based on bedside urinal. Still seems mildly SOB on exam, though lungs are clear and no significant LE edema.  - Will follow-up echocardiogram to determine  next steps - Continue carvedilol and losartan  - Continue to monitor strict I&Os and daily weights  3. HTN: BP markedly elevated on admission. While improved, BP remains persistently above goal despite max dose of carvedilol and amlodipine. He was started on 50mg  daily losartan though has not received a dose  yet. Home HCTZ on hold to allow for diuresis - Continue current regimen for now. He did not get medications yesterday so will watch for response today.  - Further management pending echo results  4. HLD: LDL 139 this admission on atorvastatin 20mg  daily - Will increase atorvastatin to 80mg  daily  5. MR: mild on last echo in 2018. Felt to be worse on LHC yesterday - Will await echo results  6. Tobacco abuse: reports smoking 2 black and milds a day - Continue to encourage cessation    For questions or updates, please contact [facility] Please consult [email or web address] for contact info under        Signed, , PA-C  04/20/2020, 8:48 AM

## 2020-04-21 ENCOUNTER — Other Ambulatory Visit: Payer: Self-pay | Admitting: Medical

## 2020-04-21 DIAGNOSIS — R400 Somnolence: Secondary | ICD-10-CM

## 2020-04-21 DIAGNOSIS — R0683 Snoring: Secondary | ICD-10-CM

## 2020-04-21 DIAGNOSIS — R0600 Dyspnea, unspecified: Secondary | ICD-10-CM

## 2020-04-21 LAB — BASIC METABOLIC PANEL
Anion gap: 12 (ref 5–15)
BUN: 13 mg/dL (ref 6–20)
CO2: 21 mmol/L — ABNORMAL LOW (ref 22–32)
Calcium: 9 mg/dL (ref 8.9–10.3)
Chloride: 103 mmol/L (ref 98–111)
Creatinine, Ser: 1.04 mg/dL (ref 0.61–1.24)
GFR, Estimated: >60 mL/min (ref >60)
Glucose, Bld: 102 mg/dL — ABNORMAL HIGH (ref 70–99)
Potassium: 4 mmol/L (ref 3.5–5.1)
Sodium: 136 mmol/L (ref 135–145)

## 2020-04-21 MED ORDER — ATORVASTATIN CALCIUM 80 MG PO TABS: 80.0000 mg | ORAL_TABLET | Freq: Every day | ORAL | 3 refills | Status: DC

## 2020-04-21 MED ORDER — AMLODIPINE BESYLATE 10 MG PO TABS: 10.0000 mg | ORAL_TABLET | Freq: Every day | ORAL | 3 refills | Status: DC

## 2020-04-21 MED ORDER — HYDROCHLOROTHIAZIDE 25 MG PO TABS
25.0000 mg | ORAL_TABLET | Freq: Every day | ORAL | Status: DC
Start: 2020-04-21 — End: 2020-04-21
  Administered 2020-04-21: 08:00:00 25 mg via ORAL
  Filled 2020-04-21: qty 1

## 2020-04-21 MED ORDER — NITROGLYCERIN 0.4 MG SL SUBL
0.4000 mg | SUBLINGUAL_TABLET | SUBLINGUAL | 3 refills | Status: DC | PRN
Start: 2020-04-21 — End: 2020-05-29

## 2020-04-21 MED ORDER — LOSARTAN POTASSIUM 50 MG PO TABS: 50.0000 mg | ORAL_TABLET | Freq: Every day | ORAL | 3 refills | Status: DC

## 2020-04-21 MED FILL — AMLODIPINE BESYLATE 10 MG T: 10 | 90 days supply | Qty: 90 | Fill #0

## 2020-04-21 MED FILL — NITROGLYCERIN 0.4 MG TAB SL: 0.4 | 25 days supply | Qty: 25 | Fill #0

## 2020-04-21 MED FILL — ATORVASTATIN CALCIUM 80 MG: 80 | 90 days supply | Qty: 90 | Fill #0

## 2020-04-21 MED FILL — LOSARTAN POTASSIUM 50 MG TA: 50 | 90 days supply | Qty: 90 | Fill #0

## 2020-04-21 NOTE — Plan of Care (Signed)

## 2020-04-21 NOTE — Discharge Summary (Signed)
Discharge Summary    Patient ID: [name] MRN: [id number]; DOB: Jun 01, 1960  Admit date: 04/18/2020 Discharge date: 04/21/2020  Primary Care Provider: [name], MD  Primary Cardiologist: [name], MD  Primary Electrophysiologist:  None   Discharge Diagnoses    Principal Problem:   Unstable angina [facility]) Active Problems:   Hx of CABG   Essential hypertension   Tobacco dependence   Hyperlipidemia    Diagnostic Studies/Procedures    Left heart catheterization 04/19/20:  Prox RCA-1 lesion is 70% stenosed.  Prox RCA-2 lesion is 70% stenosed.  Mid RCA lesion is 70% stenosed.  RV Branch lesion is 50% stenosed.  Ost LAD lesion is 80% stenosed.  Mid LM to Dist LM lesion is 45% stenosed.  1st LPL lesion is 30% stenosed.  1st Diag lesion is 30% stenosed.  The present study demonstrates no significant change in the previous 45 - 50% distal left main stenosis.  The LAD ostium has 80% stenosis. There is mild 30% narrowing in the first diagonal branch. Competitive filling is present in the mid LAD due to LIMA flow.  There was a large ramus immediate vessel with significant tortuosity without significant stenoses.  The left circumflex vessel is a dominant vessel which has mild luminal irregularity. There is 30% stenosis in the distal marginal branch.  The RCA is nondominant but diffusely diseased with stenoses of 70% in the proximal mid and mid distal segment with 70% marginal stenoses.  Patent LIMA to LAD.  Mild LV dysfunction with EF estimated in the 45 to 50% range. Appears to be mild anterolateral hypocontractility. LVEDP is significantly elevated at 30 to 32 mm. There appears to be 2+ angiographic mitral regurgitation.  RECOMMENDATION: Increase medical therapy for underlying CAD. Diuresis with significant LVEDP elevation. 2D echo Doppler study to further assess systolic and diastolic function and mitral regurgitation. Smoking  cessation is imperative. Aggressive blood pressure control and lipid management.  Diagnostic Dominance: Left     Echocardiogram: 04/20/20: 1. Left ventricular ejection fraction, by estimation, is 50 to 55%. The  left ventricle has low normal function. The left ventricle has no regional  wall motion abnormalities. There is moderate left ventricular hypertrophy.  Left ventricular diastolic  parameters are consistent with Grade I diastolic dysfunction (impaired  relaxation).  2. Right ventricular systolic function is normal. The right ventricular  size is normal.  3. The mitral valve is normal in structure. Trivial mitral valve  regurgitation. No evidence of mitral stenosis.  4. The aortic valve is tricuspid. Aortic valve regurgitation is not  visualized. No aortic stenosis is present.  5. There is borderline dilatation of the aortic root, measuring 38 mm.  There is mild dilatation of the ascending aorta, measuring 39 mm.  6. The inferior vena cava is normal in size with greater than 50%  respiratory variability, suggesting right atrial pressure of 3 mmHg.  _____________   History of Present Illness     Edward Farmer is a 59 y.o. male with a PMH of CAD s/p CABG (LIMA to LAD), HTN, HLD, and tobacco abuse, who presented with chest pain. He had a cardiac catheterization procedure performed by Dr. Herbie Baltimore in the setting of unstable angina on 07/16/2016 revealing high-grade ostial LAD disease and a left dominant system. The following day he underwent CABG x1 by Dr. Cornelius Moras with a LIMA to his LAD done off-pump. He was doing well till recently. Over the past week or so he has been experiencing left sided chest pain  along with left arm numbness. The pain is provoked by exertion and relieved with rest. There is some associated SOB and nausea as well.  His vitals were: BP 183/113 mmHg with a heart rate in the 60s to 80s. The high sensitivity troponins were 54 and 47. Other labs were: K 3.9,  BUN 13, Cr 1.31, WBC 10.6, Hgb 14.8 and plt 218. The CXR was unremarkable. The ECG showed NSR, rate 84 bpm, LVH and repolarization abnormalities.  Hospital Course     Consultants: None   1. Unstable angina in patient with CAD s/p CABG in 2018: patient presented with exertional chest pain, though also occurring at rest at times. BP significantly elevated on admission. HsTrop peaked at 54 and trended down. EKG with LVH and anterolateral TWI c/w repolarization abnormality. He was recommended for a heart catheterization to further evaluate symptoms. He underwent LHC 04/19/20 which showed severe native multivessel disease with 80% ostial LAD and 70% stenosis scattered throughout the RCA (2 proximal lesions and 1 mRCA), as well as 45% LM and 30% D1 and 1st LPL stenosis with patent LIMA to LAD. LVEDP was noted to be significantly elevated with moderate MR noted. He was recommended to aggressive risk factor modifications including smoking cessation and BP/lipid control.  - Continue aspirin and statin - Continue BBlocker  2. Elevated LVEDP: noted on cath 04/19/20. He was given IV lasix  x1 dose with UOP - in the past 24 hours and -1.4L this admission. Weight down to 215lbs on the day of discharge, from 219 lbs at peak. Echo showed EF 50-55%, no RWMA, moderate LVH, G1DD, normal RV size/function, tribial MR, and borderline aortic root of 38mm with ascending aorta measuring 39mm.  - Continue carvedilol and losartan  - Continue to limit salt intake  3. HTN: BP markedly elevated on admission despite max dose of carvedilol and amlodipine, and HCTZ  daily. He was started on losartan  daily with improvement in BP, though occasionally still above goal - Continue amlodipine, carvedilol, HCTZ, and losartan - Patient instructed to keep a log of blood pressure to bring to his follow-up appointment to determine if additional medication adjustments need to be made.   4. HLD: LDL 139 this admission on  atorvastatin  daily which was then increased to  daily - Continue atorvastatin  daily - Will need repeat FLP/LFTs in 6-8 weeks  5. MR: mild on last echo in 2018. Felt to be worse on [facility] 04/19/20, however echo showed trivial MR - Continue to monitor routinely outpatient  6. Tobacco abuse: reports smoking 2 black and milds a day - Smoking cessation was strongly encouraged  7. Suspected OSA: patient noted to have numerous apneic episodes overnight on telemetry monitor. He reports PND, snoring, and daytime somnolence. No prior sleep study. - Will order an outpatient sleep study - message sent to Claiborne Rigg, CMA to assit.   Did the patient have an acute coronary syndrome (MI, NSTEMI, STEMI, etc) this admission?:  No     _____________  Discharge Vitals Blood pressure 138/85, pulse 66, temperature 98 F (36.7 C), temperature source Oral, resp. rate 18, height  (1.778 m), weight 97.5 kg, SpO2 94 %.  Filed Weights   04/19/20 1823 04/20/20 0502 04/21/20 0456  Weight: 99.6 kg 98.4 kg 97.5 kg    Labs & Radiologic Studies    CBC Recent Labs    04/19/20 0826 04/20/20 0224  WBC 8.8 8.3  HGB 14.4 14.1  HCT 46.2 42.4  MCV 91.7  88.0  PLT 210 204   Basic Metabolic Panel Recent Labs    16/02/9611/16/21 0224 04/21/20 0307  NA 135 136  K 4.2 4.0  CL 106 103  CO2 20* 21*  GLUCOSE 98 102*  BUN 11 13  CREATININE 1.09 1.04  CALCIUM 8.8* 9.0   Liver Function Tests No results for input(s): AST, ALT, ALKPHOS, BILITOT, PROT, ALBUMIN in the last 72 hours. No results for input(s): LIPASE, AMYLASE in the last 72 hours. High Sensitivity Troponin:   Recent Labs  Lab 04/18/20 1838 04/18/20 2342 04/19/20 0625 04/19/20 1046  TROPONINIHS 54* 47* 36* 34*    BNP Invalid input(s): POCBNP D-Dimer No results for input(s): DDIMER in the last 72 hours. Hemoglobin A1C Recent Labs    04/19/20 0826  HGBA1C 6.0*   Fasting Lipid Panel Recent Labs    04/19/20 1046  CHOL 186   HDL 31*  LDLCALC 139*  TRIG 79  CHOLHDL 6.0   Thyroid Function Tests No results for input(s): TSH, T4TOTAL, T3FREE, THYROIDAB in the last 72 hours.  Invalid input(s): FREET3 _____________  DG Chest 2 View  Result Date: 04/18/2020 CLINICAL DATA:  Chest pain EXAM: CHEST - 2 VIEW COMPARISON:  11/30/2017 FINDINGS: Lungs are clear. No pneumothorax or pleural effusion. Median sternotomy has been performed. Cardiac size is within normal limits. Pulmonary vascularity is normal. No acute bone abnormality. IMPRESSION: No active cardiopulmonary disease. Electronically Signed   By: Helyn NumbersAshesh  Parikh MD   On: 04/18/2020 19:08   CARDIAC CATHETERIZATION  Result Date: 04/19/2020  Prox RCA-1 lesion is 70% stenosed.  Prox RCA-2 lesion is 70% stenosed.  Mid RCA lesion is 70% stenosed.  RV Branch lesion is 50% stenosed.  Ost LAD lesion is 80% stenosed.  Mid LM to Dist LM lesion is 45% stenosed.  1st LPL lesion is 30% stenosed.  1st Diag lesion is 30% stenosed.  The present study demonstrates no significant change in the previous 45 - 50% distal left main stenosis. The LAD ostium has 80% stenosis.  There is mild 30% narrowing in the first diagonal branch.  Competitive filling is present in the mid LAD due to LIMA flow. There was a large ramus immediate vessel with significant tortuosity without significant stenoses. The left circumflex vessel is a dominant vessel which has mild luminal irregularity.  There is 30% stenosis in the distal marginal branch. The RCA is nondominant but diffusely diseased with stenoses of 70% in the proximal mid and mid distal segment with 70% marginal stenoses. Patent LIMA to LAD. Mild LV dysfunction with EF estimated in the 45 to 50% range.  Appears to be mild anterolateral hypocontractility.  LVEDP is significantly elevated at 30 to 32 mm.  There appears to be 2+ angiographic mitral regurgitation. RECOMMENDATION: Increase medical therapy for underlying CAD.  Diuresis with significant  LVEDP elevation.  2D echo Doppler study to further assess systolic and diastolic function and mitral regurgitation.  Smoking cessation is imperative.  Aggressive blood pressure control and lipid management.   ECHOCARDIOGRAM COMPLETE  Result Date: 04/20/2020    ECHOCARDIOGRAM REPORT   Patient Name:   Kelby AlineGEORGE E Kloehn Date of Exam: 04/20/2020 Medical Rec #:  [id number]     Height:       70.0 in Accession #:    [id number]    Weight:       217.0 lb Date of Birth:  1960/11/09     BSA:          2.161 m Patient Age:  59 years      BP:           148/96 mmHg Patient Gender: M             HR:           82 bpm. Exam Location:  Inpatient Procedure: 2D Echo, Cardiac Doppler, Color Doppler and Intracardiac            Opacification Agent Indications:    Chest Pain 786.50 / R07.9  History:        Patient has prior history of Echocardiogram examinations, most                 recent 08/29/2016. Risk Factors:Hypertension and Current Smoker.  Sonographer:    Renella Cunas RDCS Referring Phys: [id number] Ronnald Ramp O'NEAL IMPRESSIONS  1. Left ventricular ejection fraction, by estimation, is 50 to 55%. The left ventricle has low normal function. The left ventricle has no regional wall motion abnormalities. There is moderate left ventricular hypertrophy. Left ventricular diastolic parameters are consistent with Grade I diastolic dysfunction (impaired relaxation).  2. Right ventricular systolic function is normal. The right ventricular size is normal.  3. The mitral valve is normal in structure. Trivial mitral valve regurgitation. No evidence of mitral stenosis.  4. The aortic valve is tricuspid. Aortic valve regurgitation is not visualized. No aortic stenosis is present.  5. There is borderline dilatation of the aortic root, measuring 38 mm. There is mild dilatation of the ascending aorta, measuring 39 mm.  6. The inferior vena cava is normal in size with greater than 50% respiratory variability, suggesting right atrial pressure of 3  mmHg. FINDINGS  Left Ventricle: Left ventricular ejection fraction, by estimation, is 50 to 55%. The left ventricle has low normal function. The left ventricle has no regional wall motion abnormalities. Definity contrast agent was given IV to delineate the left ventricular endocardial borders. The left ventricular internal cavity size was normal in size. There is moderate left ventricular hypertrophy. Left ventricular diastolic parameters are consistent with Grade I diastolic dysfunction (impaired relaxation). Right Ventricle: The right ventricular size is normal.Right ventricular systolic function is normal. Left Atrium: Left atrial size was normal in size. Right Atrium: Right atrial size was normal in size. Pericardium: There is no evidence of pericardial effusion. Mitral Valve: The mitral valve is normal in structure. Mild mitral annular calcification. Trivial mitral valve regurgitation. No evidence of mitral valve stenosis. Tricuspid Valve: The tricuspid valve is normal in structure. Tricuspid valve regurgitation is trivial. No evidence of tricuspid stenosis. Aortic Valve: The aortic valve is tricuspid. Aortic valve regurgitation is not visualized. No aortic stenosis is present. Pulmonic Valve: The pulmonic valve was not well visualized. Pulmonic valve regurgitation is not visualized. No evidence of pulmonic stenosis. Aorta: The aortic root is normal in size and structure. There is borderline dilatation of the aortic root, measuring 38 mm. There is mild dilatation of the ascending aorta, measuring 39 mm. Venous: The inferior vena cava is normal in size with greater than 50% respiratory variability, suggesting right atrial pressure of 3 mmHg.   LEFT VENTRICLE PLAX 2D LVIDd:         4.70 cm      Diastology LVIDs:         3.50 cm      LV e' medial:    3.98 cm/s LV PW:         1.00 cm      LV E/e' medial:  10.5 LV IVS:  1.00 cm      LV e' lateral:   4.28 cm/s LVOT diam:     2.20 cm      LV E/e' lateral: 9.8 LV  SV:         55 LV SV Index:   25 LVOT Area:     3.80 cm  LV Volumes (MOD) LV vol d, MOD A2C: 88.3 ml LV vol d, MOD A4C: 101.0 ml LV vol s, MOD A2C: 44.5 ml LV vol s, MOD A4C: 44.9 ml LV SV MOD A2C:     43.8 ml LV SV MOD A4C:     101.0 ml LV SV MOD BP:      46.6 ml RIGHT VENTRICLE RV S prime:     10.80 cm/s TAPSE (M-mode): 1.3 cm LEFT ATRIUM             Index       RIGHT ATRIUM           Index LA diam:        3.30 cm 1.53 cm/m  RA Area:     14.70 cm LA Vol (A2C):   30.6 ml 14.16 ml/m RA Volume:   34.80 ml  16.10 ml/m LA Vol (A4C):   24.3 ml 11.24 ml/m LA Biplane Vol: 28.4 ml 13.14 ml/m  AORTIC VALVE LVOT Vmax:   90.30 cm/s LVOT Vmean:  64.200 cm/s LVOT VTI:    0.144 m  AORTA Ao Root diam: 3.80 cm Ao Asc diam:  3.90 cm MITRAL VALVE MV Area (PHT): 3.42 cm    SHUNTS MV Decel Time: 222 msec    Systemic VTI:  0.14 m MV E velocity: 41.80 cm/s  Systemic Diam: 2.20 cm MV A velocity: 72.70 cm/s MV E/A ratio:  0.57 Olga Millers MD Electronically signed by Olga Millers MD Signature Date/Time: 04/20/2020/12:04:34 PM    Final    Disposition   Pt is being discharged home today in good condition.  Follow-up Plans & Appointments     Follow-up Information    Beatriz Stallion., PA-C Follow up on 05/03/2020.   Specialties: Physician Assistant, Cardiology Why: Please arrive 15 minutes early for your 3:15pm post-hospital cardiology appointment. Contact information: [address] [place] [address] [phone]              Discharge Instructions    AMB Referral to [facility] Coordinaton   Complete by: As directed    Reason for Referral: Care Coordination (Managed Medicaid)   MM Services needed:  Nurse Case Manager Social Work     Diagnoses of:  High-risk Medicaid Other     Other Diagnosis: New cardiac issues, HX CABG   Reason for Consult:  High-risk Medicaid Other     Other reason: Housing issues for post hospital, has a history with Archivist - [employer] has  help him there in the past   Expected date of contact: Emergent - 3 Days      Discharge Medications   Allergies as of 04/21/2020   No Known Allergies     Medication List    STOP taking these medications   triamcinolone 0.1 % Commonly known as: KENALOG     TAKE these medications   amLODipine 10 MG tablet Commonly known as: NORVASC Take 1 tablet (10 mg total) by mouth daily.   aspirin EC 81 MG tablet Take 1 tablet (81 mg total) by mouth daily. Swallow whole.   atorvastatin 80 MG tablet Commonly known as: LIPITOR Take  1 tablet (80 mg total) by mouth daily at 6 PM. What changed:   medication strength  how much to take  when to take this   carvedilol 25 MG tablet Commonly known as: COREG Take 1 tablet (25 mg total) by mouth 2 (two) times daily with a meal.   hydrochlorothiazide 25 MG tablet Commonly known as: HYDRODIURIL Take 1 tablet (25 mg total) by mouth daily.   losartan 50 MG tablet Commonly known as: COZAAR Take 1 tablet (50 mg total) by mouth daily. Start taking on: April 22, 2020   nicotine 14 mg/24hr patch Commonly known as: NICODERM CQ - dosed in mg/24 hours Place 1 patch (14 mg total) onto the skin daily.   nitroGLYCERIN 0.4 MG SL tablet Commonly known as: NITROSTAT Place 1 tablet (0.4 mg total) under the tongue every 5 (five) minutes x 3 doses as needed for chest pain.          Outstanding Labs/Studies   Outpatient sleep study Repeat FLP/LFTs in 6-8 weeks BMET at follow-up  Duration of Discharge Encounter   Greater than 30 minutes including physician time.  Signed, Beatriz Stallion, PA-C 04/21/2020, 9:07 AM

## 2020-04-21 NOTE — Progress Notes (Signed)
D/C instructions given qand reviewed. No questions asked but encouraged to call with any concerns. IV's removed, tolerated well. Pt informed of cost of meds from [facility] pharmacy and he stated he couldn't pay. Informed Deborah, CM.

## 2020-04-21 NOTE — Discharge Instructions (Signed)
PLEASE REMEMBER TO BRING ALL OF YOUR MEDICATIONS TO EACH OF YOUR FOLLOW-UP OFFICE VISITS.  PLEASE ATTEND ALL SCHEDULED FOLLOW-UP APPOINTMENTS.   Activity: Increase activity slowly as tolerated. You may shower, but no soaking baths (or swimming) for 1 week. No driving for 24 hours. No lifting over 5 lbs for 1 week. No sexual activity for 1 week.   You May Return to Work: in 1 week (if applicable)  Wound Care: You may wash cath site gently with soap and water. Keep cath site clean and dry. If you notice pain, swelling, bleeding or pus at your cath site, please call [phone].   1. Take your medicines as prescribed. If you have concerns about your medications, please call us before you stop taking them.  2. Eat low salt foods--Limit salt (sodium) to 2000 mg per day. This will help prevent your body from holding onto fluid. Read food labels as many processed foods have a lot of sodium, especially canned goods and prepackaged meats. If you would like some assistance choosing low sodium foods, we would be happy to set you up with a nutritionist. 3. Stay as active as you can everyday. Staying active will give you more energy and make your muscles stronger. Start with 5 minutes at a time and work your way up to 30 minutes a day. Break up your activities--do some in the morning and some in the afternoon. Start with 3 days per week and work your way up to 5 days as you can.  If you have chest pain, feel short of breath, dizzy, or lightheaded, STOP. If you don't feel better after a short rest, call 911. If you do feel better, call the office to let us know you have symptoms with exercise.

## 2020-04-21 NOTE — Progress Notes (Signed)
   Patient with several apneic episodes noted on telemetry monitor overnight during hospitalization from 04/19/20-04/21/20. No prior sleep study. Will route to Claiborne Rigg for assistance with scheduling.   Beatriz Stallion, PA-C 04/21/20; 8:28 AM

## 2020-04-24 ENCOUNTER — Telehealth: Payer: Self-pay
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
## 2020-04-24 NOTE — Telephone Encounter (Signed)
Transition of care call completed by cardiology office.

## 2020-04-25 ENCOUNTER — Other Ambulatory Visit: Payer: Self-pay

## 2020-04-25 ENCOUNTER — Other Ambulatory Visit: Payer: Self-pay | Admitting: *Deleted

## 2020-04-25 NOTE — Patient Outreach (Signed)
Care Coordination - Case Manager  04/25/2020  Edward Farmer 1960/09/10 [id number]  Subjective:  Edward Farmer is an 59 y.o. year old male who is a primary patient of Edward Field Charlcie Cradle, MD.  [name] was given information about Medicaid Managed Care team care coordination services today. [name] agreed to services and verbal consent obtained  Review of patient status, laboratory and other test data was performed as part of evaluation for provision of services.  SDOH: SDOH Screenings   Alcohol Screen: Not on file  Depression (PHQ2-9): Medium Risk  . PHQ-2 Score: 9  Financial Resource Strain: Not on file  Food Insecurity: Not on file  Housing: Not on file  Physical Activity: Not on file  Social Connections: Not on file  Stress: Not on file  Tobacco Use: High Risk  . Smoking Tobacco Use: Current Every Day Smoker  . Smokeless Tobacco Use: Never Used  Transportation Needs: Not on file     Objective:    No Known Allergies  Medications:    Medications Reviewed Today    Reviewed by Delories Heinz, CPhT (Pharmacy Technician) on 04/19/20 at 0503  Med List Status: Complete  Medication Order Taking? Sig Documenting Provider Last Dose Status Informant  amLODipine (NORVASC) 10 MG tablet [id number] Yes Take 1 tablet (10 mg total) by mouth daily. [name], MD 04/18/2020 Unknown time Active Self  aspirin EC 81 MG tablet [id number] Yes Take 1 tablet (81 mg total) by mouth daily. Swallow whole. [name], MD 04/18/2020 Unknown time Active Self  atorvastatin (LIPITOR) 20 MG tablet [id number] Yes Take 1 tablet (20 mg total) by mouth daily. [name], MD 04/18/2020 Unknown time Active Self  carvedilol (COREG) 25 MG tablet [id number] Yes Take 1 tablet (25 mg total) by mouth 2 (two) times daily with a meal. [name], MD 04/18/2020 0830 Active Self  hydrochlorothiazide (HYDRODIURIL) 25 MG tablet [id number] Yes Take 1 tablet (25 mg total) by mouth daily.  [name], MD 04/18/2020 Unknown time Active Self  nicotine (NICODERM CQ - DOSED IN MG/24 HOURS) 14 mg/24hr patch [id number] No Place 1 patch (14 mg total) onto the skin daily.  Patient not taking: Reported on 04/19/2020   [name], MD Not Taking Unknown time Consider Medication Status and Discontinue Self  triamcinolone cream (KENALOG) 0.1 % [id number] No Apply 1 application topically 2 (two) times daily.  Patient not taking: Reported on 04/19/2020   [name], MD Completed Course Unknown time Active Self  Med List Note Gorden Harms, CPhT 07/14/16 1712):            Assessment:   Patient Care Plan: Interdisciplinary Collaboration/Care Coordination    Problem Identified: Collaborative Care Coordination   Priority: High  Onset Date: 04/25/2020  Note:   Current Barriers:  . Care Management support, education, and care coordination needs related to  housing assistance Case Manager Clinical Goal(s):  [name] Over the next 30 days, patient will work with BSW to address needs related to Housing barriers in patient with HTN Interventions:  . Collaborated with BSW to initiate plan of care to address needs related to Housing barriers in patient with HTN Patient Goals/ Self Care Activities:  . Patient will work with BSW to address care coordination needs and will continue to work with the clinical team to address health care and disease management related needs.    Follow up plan: RNCM will follow up with a telephone visit  over the next 7 days.        Plan: RNCM will reach out to the patient over the next 7 days by telephone Follow-up:  Patient agrees to Care Plan and Follow-up.   Estanislado Emms RN, BSN [place]  [employer]

## 2020-04-25 NOTE — Patient Instructions (Signed)
Visit Information  Mr. Enfield was given information about Medicaid Managed Care team care coordination services as a part of their [facility] Community Plan Medicaid benefit. NINO AMANO verbally consented to engagement with the [facility] Managed Care team.   For questions related to your [facility], please call: [phone] or visit the homepage here: [email or web address]  If you would like to schedule transportation through your [facility], please call the following number at least 2 days in advance of your appointment: [phone]  Goals Addressed   None    Patient Care Plan: Interdisciplinary Collaboration/Care Coordination    Problem Identified: Collaborative Care Coordination   Priority: High  Onset Date: 04/25/2020  Note:   Current Barriers:  . Care Management support, education, and care coordination needs related to  housing assistance Case Manager Clinical Goal(s):  [name] Over the next 30 days, patient will work with BSW to address needs related to Housing barriers in patient with HTN Interventions:  . Collaborated with BSW to initiate plan of care to address needs related to Housing barriers in patient with HTN Patient Goals/ Self Care Activities:  . Patient will work with BSW to address care coordination needs and will continue to work with the clinical team to address health care and disease management related needs.    Follow up plan: RNCM will follow up with a telephone visit over the next 7 days.        Social Worker will follow up with patient in 7 days with housing resources.[name]

## 2020-04-25 NOTE — Patient Instructions (Signed)
Visit Information  Mr. Asare was given information about Medicaid Managed Care team care coordination services as a part of their [facility] Community Plan Medicaid benefit. GEDALYA JIM verbally consented to engagement with the [facility] Managed Care team.   For questions related to your [facility], please call: [phone] or visit the homepage here: [email or web address]  If you would like to schedule transportation through your [facility], please call the following number at least 2 days in advance of your appointment: [phone]   Patient Care Plan: Interdisciplinary Collaboration/Care Coordination    Problem Identified: Collaborative Care Coordination   Priority: High  Onset Date: 04/25/2020  Note:   Current Barriers:  . Care Management support, education, and care coordination needs related to  housing assistance Case Manager Clinical Goal(s):  [name] Over the next 30 days, patient will work with BSW to address needs related to Housing barriers in patient with HTN Interventions:  . Collaborated with BSW to initiate plan of care to address needs related to Housing barriers in patient with HTN Patient Goals/ Self Care Activities:  . Patient will work with BSW to address care coordination needs and will continue to work with the clinical team to address health care and disease management related needs.    Follow up plan: RNCM will follow up with a telephone visit over the next 7 days.       The Managed Medicaid care management team will reach out to the patient again over the next 7 days.   Heidi Dach, RN

## 2020-05-01 ENCOUNTER — Other Ambulatory Visit: Payer: Self-pay

## 2020-05-01 NOTE — Patient Instructions (Signed)
Visit Information  Edward Farmer was given information about Medicaid Managed Care team care coordination services as a part of their [facility] Community Plan Medicaid benefit. Edward Farmer verbally consented to engagement with the [facility] Managed Care team.   For questions related to your [facility], please call: [phone] or visit the homepage here: [email or web address]  If you would like to schedule transportation through your [facility], please call the following number at least 2 days in advance of your appointment: [phone]  Goals Addressed   None    Patient Care Plan: Interdisciplinary Collaboration/Care Coordination    Problem Identified: Collaborative Care Coordination   Priority: High  Onset Date: 04/25/2020  Note:   Current Barriers:  . Care Management support, education, and care coordination needs related to  housing assistance Case Manager Clinical Goal(s):  [name] Over the next 30 days, patient will work with BSW to address needs related to Housing barriers in patient with HTN Interventions:  . Collaborated with BSW to initiate plan of care to address needs related to Housing barriers in patient with HTN Patient Goals/ Self Care Activities:  . Patient will work with BSW to address care coordination needs and will continue to work with the clinical team to address health care and disease management related needs.    Follow up plan: RNCM will follow up with a telephone visit over the next 7 days.        Social Worker will follow up with patient on 12/29.[name]   [name]

## 2020-05-01 NOTE — Patient Instructions (Addendum)
Visit Information  [name] was given information about Medicaid Managed Care team care coordination services as a part of their [facility] Community [facility] Medicaid benefit. Janalyn ShyGeorge E Shutters verbally consented to engagement with the [facility] Man[facility] team.   For questions related to your [facility] Community Plan Medicaid, please call: [phone] or visit the homepage here: [email or web address]  If you would like to schedule transportation through your [facility] Community Plan Medicaid, please call the following number at least 2 days in advance of your appointment: [phone]  Goals Addressed            This Visit's Progress   . Medication Management       Medicaid Managed Care CARE PLAN ENTRY (see longitudinal plan of care for additional care plan information)  Current Barriers:  . Social, financial, community barriers:  o Patient states he's about to lose his house o High school graduate, very intelligent, has good grasp on medication literacy . Patient with complex health conditions multiple comorbidities including Cardio and BP . Self-manages medications. Does not use a pill box or other adherence strategies . Pharmacy  [facility] [place] [place] [address] Phone: [phone] Fax: [phone]  [facility] [place] [address] [place] [address] Phone: [phone] Fax: [phone]     Pharmacist Clinical Goal(s):  [name]. Over the next 30 days, patient will work with PharmD and provider towards optimized medication management  Interventions: . Comprehensive medication review performed; medication list updated in electronic medical record . Inter-disciplinary care team collaboration (see longitudinal plan of care)  Cardio BP Readings from Last 3 Encounters:  04/21/20  138/85  02/11/20 (!) 178/117  12/29/19 116/79    BP: Doesn't check Amlodipine 10mg  ASA 81 Carvedilol 25mg  HCTZ 25mg  Losartan 50mg  December 2021: Patient is facing potential homelessness right now, this is his main focus/priority. When I called, he was walking around trying to find new employment/housing (Lost job after previous Sx and now could lose home). Will focus on housing, then BP. Patient seems compliant on all meds after scare of last Sx  Lipids Lab Results  Component Value Date   CHOL 186 04/19/2020   CHOL 117 12/29/2019   CHOL 177 03/12/2018   Lab Results  Component Value Date   HDL 31 (L) 04/19/2020   HDL 38 (L) 12/29/2019   HDL 39 (L) 03/12/2018   Lab Results  Component Value Date   LDLCALC 139 (H) 04/19/2020   LDLCALC 57 12/29/2019   LDLCALC 116 (H) 03/12/2018   Lab Results  Component Value Date   TRIG 79 04/19/2020   TRIG 120 12/29/2019   TRIG 108 03/12/2018   Lab Results  Component Value Date   CHOLHDL 6.0 04/19/2020   CHOLHDL 3.1 12/29/2019   CHOLHDL 4.5 03/12/2018   No results found for: LDLDIRECT  Atorvastatin 80mg  December 2021: Patient is facing potential homelessness right now, this is his main focus/priority. When I called, he was walking around trying to find new employment/housing (Lost job after previous Sx and now could lose home). Will focus on housing, then BP. Patient seems compliant on all meds after scare of last Sx   Meds: -Per fill Fx, patient wasn't compliant on summer but states after his Sx that he is compliant now -Patient doesn't have a car, I am unsure if his Pharmacy delivers medications to him,  will f/u next month. Patient had a housing appointment and wasn't able to talk for entire scheduled visit   Patient Self Care Activities:  . Patient will take medications as prescribed . Patient will focus on improved adherence by January   Initial goal documentation  Artelia Laroche, Pharm.D., Managed Medicaid Pharmacist -  [phone]        Patient Care Plan: Interdisciplinary Collaboration/Care Coordination    Problem Identified: Collaborative Care Coordination   Priority: High  Onset Date: 04/25/2020  Note:   Current Barriers:  . Care Management support, education, and care coordination needs related to  housing assistance Case Manager Clinical Goal(s):  [name] Over the next 30 days, patient will work with BSW to address needs related to Housing barriers in patient with HTN Interventions:  . Collaborated with BSW to initiate plan of care to address needs related to Housing barriers in patient with HTN Patient Goals/ Self Care Activities:  . Patient will work with BSW to address care coordination needs and will continue to work with the clinical team to address health care and disease management related needs.    Follow up plan: RNCM will follow up with a telephone visit over the next 7 days.       Please see education materials related to Cardio provided as Financial risk analyst.   Patient verbalizes understanding of instructions provided today.   The Managed Medicaid care management team will reach out to the patient again over the next 30 days.   [name], [facility]  Hypertension, Adult High blood pressure (hypertension) is when the force of blood pumping through the arteries is too strong. The arteries are the blood vessels that carry blood from the heart throughout the body. Hypertension forces the heart to work harder to pump blood and may cause arteries to become narrow or stiff. Untreated or uncontrolled hypertension can cause a heart attack, heart failure, a stroke, kidney disease, and other problems. A blood pressure reading consists of a higher number over a lower number. Ideally, your blood pressure should be below 120/80. The first ("top") number is called the systolic pressure. It is a measure of the pressure in your arteries as your heart beats. The second ("bottom") number is called the  diastolic pressure. It is a measure of the pressure in your arteries as the heart relaxes. What are the causes? The exact cause of this condition is not known. There are some conditions that result in or are related to high blood pressure. What increases the risk? Some risk factors for high blood pressure are under your control. The following factors may make you more likely to develop this condition:  Smoking.  Having type 2 diabetes mellitus, high cholesterol, or both.  Not getting enough exercise or physical activity.  Being overweight.  Having too much fat, sugar, calories, or salt (sodium) in your diet.  Drinking too much alcohol. Some risk factors for high blood pressure may be difficult or impossible to change. Some of these factors include:  Having chronic kidney disease.  Having a family history of high blood pressure.  Age. Risk increases with age.  Race. You may be at higher risk if you are African American.  Gender. Men are at higher risk than women before age 27. After age 34, women are at higher risk than men.  Having obstructive sleep apnea.  Stress. What are the signs or symptoms? High blood pressure may not cause symptoms. Very high blood pressure (hypertensive crisis) may cause:  Headache.  Anxiety.  Shortness of breath.  Nosebleed.  Nausea and vomiting.  Vision changes.  Severe chest pain.  Seizures. How is this diagnosed? This condition is diagnosed by measuring your blood pressure while you are seated, with your arm resting on a flat surface, your legs uncrossed, and your feet flat on the floor. The cuff of the blood pressure monitor will be placed directly against the skin of your upper arm at the level of your heart. It should be measured at least twice using the same arm. Certain conditions can cause a difference in blood pressure between your right and left arms. Certain factors can cause blood pressure readings to be lower or higher than  normal for a short period of time:  When your blood pressure is higher when you are in a health care provider's office than when you are at home, this is called white coat hypertension. Most people with this condition do not need medicines.  When your blood pressure is higher at home than when you are in a health care provider's office, this is called masked hypertension. Most people with this condition may need medicines to control blood pressure. If you have a high blood pressure reading during one visit or you have normal blood pressure with other risk factors, you may be asked to:  Return on a different day to have your blood pressure checked again.  Monitor your blood pressure at home for 1 week or longer. If you are diagnosed with hypertension, you may have other blood or imaging tests to help your health care provider understand your overall risk for other conditions. How is this treated? This condition is treated by making healthy lifestyle changes, such as eating healthy foods, exercising more, and reducing your alcohol intake. Your health care provider may prescribe medicine if lifestyle changes are not enough to get your blood pressure under control, and if:  Your systolic blood pressure is above 130.  Your diastolic blood pressure is above 80. Your personal target blood pressure may vary depending on your medical conditions, your age, and other factors. Follow these instructions at home: Eating and drinking   Eat a diet that is high in fiber and potassium, and low in sodium, added sugar, and fat. An example eating plan is called the DASH (Dietary Approaches to Stop Hypertension) diet. To eat this way: ? Eat plenty of fresh fruits and vegetables. Try to fill one half of your plate at each meal with fruits and vegetables. ? Eat whole grains, such as whole-wheat pasta, brown rice, or whole-grain bread. Fill about one fourth of your plate with whole grains. ? Eat or drink low-fat  dairy products, such as skim milk or low-fat yogurt. ? Avoid fatty cuts of meat, processed or cured meats, and poultry with skin. Fill about one fourth of your plate with lean proteins, such as fish, chicken without skin, beans, eggs, or tofu. ? Avoid pre-made and processed foods. These tend to be higher in sodium, added sugar, and fat.  Reduce your daily sodium intake. Most people with hypertension should eat less than 1,500 mg of sodium a day.  Do not drink alcohol if: ? Your health care provider tells you not to drink. ? You are pregnant, may be pregnant, or are planning to become pregnant.  If you drink alcohol: ? Limit how much you use to:  0-1 drink a day for women.  0-2 drinks a day for men. ? Be aware of how much alcohol is in your  drink. In the [place] one drink equals one 12 oz bottle of beer (355 mL), one 5 oz glass of wine (148 mL), or one 1 oz glass of hard liquor (44 mL). Lifestyle   Work with your health care provider to maintain a healthy body weight or to lose weight. Ask what an ideal weight is for you.  Get at least 30 minutes of exercise most days of the week. Activities may include walking, swimming, or biking.  Include exercise to strengthen your muscles (resistance exercise), such as Pilates or lifting weights, as part of your weekly exercise routine. Try to do these types of exercises for 30 minutes at least 3 days a week.  Do not use any products that contain nicotine or tobacco, such as cigarettes, e-cigarettes, and chewing tobacco. If you need help quitting, ask your health care provider.  Monitor your blood pressure at home as told by your health care provider.  Keep all follow-up visits as told by your health care provider. This is important. Medicines  Take over-the-counter and prescription medicines only as told by your health care provider. Follow directions carefully. Blood pressure medicines must be taken as prescribed.  Do not skip doses of blood  pressure medicine. Doing this puts you at risk for problems and can make the medicine less effective.  Ask your health care provider about side effects or reactions to medicines that you should watch for. Contact a health care provider if you:  Think you are having a reaction to a medicine you are taking.  Have headaches that keep coming back (recurring).  Feel dizzy.  Have swelling in your ankles.  Have trouble with your vision. Get help right away if you:  Develop a severe headache or confusion.  Have unusual weakness or numbness.  Feel faint.  Have severe pain in your chest or abdomen.  Vomit repeatedly.  Have trouble breathing. Summary  Hypertension is when the force of blood pumping through your arteries is too strong. If this condition is not controlled, it may put you at risk for serious complications.  Your personal target blood pressure may vary depending on your medical conditions, your age, and other factors. For most people, a normal blood pressure is less than 120/80.  Hypertension is treated with lifestyle changes, medicines, or a combination of both. Lifestyle changes include losing weight, eating a healthy, low-sodium diet, exercising more, and limiting alcohol. This information is not intended to replace advice given to you by your health care provider. Make sure you discuss any questions you have with your health care provider. Document Revised: 12/31/2017 Document Reviewed: 12/31/2017 Elsevier Patient Education  2020 [employer].

## 2020-05-02 ENCOUNTER — Ambulatory Visit: Payer: Medicaid Other | Admitting: Critical Care Medicine

## 2020-05-03 ENCOUNTER — Other Ambulatory Visit: Payer: Self-pay

## 2020-05-03 ENCOUNTER — Ambulatory Visit: Payer: Medicaid Other | Admitting: Medical

## 2020-05-03 NOTE — Patient Instructions (Signed)
Visit Information  Mr. Wacha was given information about Medicaid Managed Care team care coordination services as a part of their [facility] Community Plan Medicaid benefit. SHIVAM MESTAS verbally consented to engagement with the [facility] Managed Care team.   For questions related to your [facility], please call: [phone] or visit the homepage here: [email or web address]  If you would like to schedule transportation through your [facility], please call the following number at least 2 days in advance of your appointment: [phone]  Goals Addressed   None    Patient Care Plan: Interdisciplinary Collaboration/Care Coordination    Problem Identified: Collaborative Care Coordination   Priority: High  Onset Date: 04/25/2020  Note:   Current Barriers:  . Care Management support, education, and care coordination needs related to  housing assistance Case Manager Clinical Goal(s):  [name] Over the next 30 days, patient will work with BSW to address needs related to Housing barriers in patient with HTN Interventions:  . Collaborated with BSW to initiate plan of care to address needs related to Housing barriers in patient with HTN Patient Goals/ Self Care Activities:  . Patient will work with BSW to address care coordination needs and will continue to work with the clinical team to address health care and disease management related needs.    Follow up plan: RNCM will follow up with a telephone visit over the next 7 days.         Social Worker will follow up with patient in 7 days.   [name]

## 2020-05-03 NOTE — Progress Notes (Deleted)
Cardiology Office Note   Date:  05/03/2020   ID:  Edward Farmer, DOB Mar 27, 1961, MRN [id number]  PCP:  [name], MD  Cardiologist:  [name], MD EP: None  No chief complaint on file.     History of Present Illness: Edward Farmer is a 59 y.o. male with a PMH of CAD s/p CABG (LIMA to LAD), HTN, HLD, and tobacco abuse who presents for post-hospital follow-up.   He was last evaluated by cardiology during a recent admission to the hospital from 04/18/20-04/21/20 after presenting with chest pain. BP was markedly elevated on admission. HsTrop peaked at 54 and trended down. EKG with LVH and anterolateral TWI c/w repolarization abnormality. He was recommended for a heart catheterization to further evaluate symptoms. He underwent LHC 04/19/20 which showed severe native multivessel disease with 80% ostial LAD and 70% stenosis scattered throughout the RCA (2 proximal lesions and 1 mRCA), as well as 45% LM and 30% D1 and 1st LPL stenosis with patent LIMA to LAD. LVEDP was noted to be significantly elevated with moderate MR noted. He was recommended to aggressive risk factor modifications including smoking cessation and BP/lipid control. He was started on losartan in addition to carvedilol, amlodipine, and HCTZ. He was also given 1 dose of IV lasix as LVEDP was elevated on LHC.   He presents today for post-hospital follow-up.  1. CAD s/p CABG in 2018:  - Continue aspirin and statin - Continue BBlocker  2. HTN:BP *** today - Continue amlodipine, carvedilol, HCTZ, and losartan - Patient instructed to keep a log of blood pressure to bring to his follow-up appointment to determine if additional medication adjustments need to be made.   3. HLD:LDL 139 this admission on atorvastatin 20mg  daily which was then increased to 80mg  daily - Continue atorvastatin 80mg  daily - Will need repeat FLP/LFTs in 4-6 weeks  4. on last echo in 2018. Felt to be worse on [facility] 04/19/20,  however echo showed trivial MR - Continue to monitor routinely outpatient  5. Tobacco abuse:reports smoking 2 black and milds a day - Smoking cessation was strongly encouraged  6. Suspected OSA: patient noted to have numerous apneic episodes overnight on telemetry monitor. He reports PND, snoring, and daytime somnolence. No prior sleep study. Sleep study ordered at discharge though does not appear to have been scheduled yet.  - Will follow-up on timing of outpatient sleep study     Past Medical History:  Diagnosis Date  . Asthma   . Bradycardia 07/14/2016   on BB. Rate improved with discontinuation.   . Coronary artery disease   . Hypertension   . Tobacco abuse     Past Surgical History:  Procedure Laterality Date  . CORONARY ARTERY BYPASS GRAFT N/A 07/17/2016   Procedure: CORONARY ARTERY BYPASS GRAFTING (CABG) x 1. OFF PUMP. LIMA TO LAD.;  Surgeon: 04/21/20, MD;  Location: [facility] OR;  Service: Open Heart Surgery;  Laterality: N/A;  . LEFT HEART CATH AND CORONARY ANGIOGRAPHY N/A 07/16/2016   Procedure: Left Heart Cath and Coronary Angiography;  Surgeon: Delight Ovens, MD;  Location: [facility] INVASIVE CV LAB;  Service: Cardiovascular;  Laterality: N/A;  . LEFT HEART CATH AND CORONARY ANGIOGRAPHY N/A 04/19/2020   Procedure: LEFT HEART CATH AND CORONARY ANGIOGRAPHY;  Surgeon: Marykay Lex, MD;  Location: [facility] INVASIVE CV LAB;  Service: Cardiovascular;  Laterality: N/A;  . TEE WITHOUT CARDIOVERSION N/A 07/17/2016   Procedure: TRANSESOPHAGEAL ECHOCARDIOGRAM (TEE);  Surgeon: 04/21/2020  Tyrone Sage, MD;  Location: [facility] OR;  Service: Open Heart Surgery;  Laterality: N/A;     Current Outpatient Medications  Medication Sig Dispense Refill  . amLODipine (NORVASC) 10 MG tablet Take 1 tablet (10 mg total) by mouth daily. 90 tablet 3  . aspirin EC 81 MG tablet Take 1 tablet (81 mg total) by mouth daily. Swallow whole. 30 tablet 11  . atorvastatin (LIPITOR) 80 MG tablet Take 1 tablet (80 mg total)  by mouth daily at 6 PM. 90 tablet 3  . carvedilol (COREG) 25 MG tablet Take 1 tablet (25 mg total) by mouth 2 (two) times daily with a meal. 180 tablet 3  . hydrochlorothiazide (HYDRODIURIL) 25 MG tablet Take 1 tablet (25 mg total) by mouth daily. 90 tablet 3  . losartan (COZAAR) 50 MG tablet Take 1 tablet (50 mg total) by mouth daily. 90 tablet 3  . nicotine (NICODERM CQ - DOSED IN MG/24 HOURS) 14 mg/24hr patch Place 1 patch (14 mg total) onto the skin daily. (Patient not taking: Reported on 04/19/2020) 28 patch 1  . nitroGLYCERIN (NITROSTAT) 0.4 MG SL tablet Place 1 tablet (0.4 mg total) under the tongue every 5 (five) minutes x 3 doses as needed for chest pain. 25 tablet 3   No current facility-administered medications for this visit.    Allergies:   Patient has no known allergies.    Social History:  The patient  reports that he has been smoking cigars. He has been smoking about 0.25 packs per day. He has never used smokeless tobacco. He reports current alcohol use. He reports that he does not use drugs.   Family History:  The patient's ***family history includes Heart attack in his father; Migraines in his mother.    ROS:  Please see the history of present illness.   Otherwise, review of systems are positive for {NONE DEFAULTED:18576::"none"}.   All other systems are reviewed and negative.    PHYSICAL EXAM: VS:  There were no vitals taken for this visit. , BMI There is no height or weight on file to calculate BMI. GEN: Well nourished, well developed, in no acute distress HEENT: normal Neck: no JVD, carotid bruits, or masses Cardiac: ***RRR; no murmurs, rubs, or gallops,no edema  Respiratory:  clear to auscultation bilaterally, normal work of breathing GI: soft, nontender, nondistended, + BS MS: no deformity or atrophy Skin: warm and dry, no rash Neuro:  Strength and sensation are intact Psych: euthymic mood, full affect   EKG:  EKG {ACTION; IS/IS JJO:84166063} ordered  today. The ekg ordered today demonstrates ***   Recent Labs: 12/29/2019: ALT 29; TSH 1.210 04/19/2020: B Natriuretic Peptide 32.5 04/20/2020: Hemoglobin 14.1; Platelets 204 04/21/2020: BUN 13; Creatinine, Ser 1.04; Potassium 4.0; Sodium 136    Lipid Panel    Component Value Date/Time   CHOL 186 04/19/2020 1046   CHOL 117 12/29/2019 0935   TRIG 79 04/19/2020 1046   HDL 31 (L) 04/19/2020 1046   HDL 38 (L) 12/29/2019 0935   CHOLHDL 6.0 04/19/2020 1046   VLDL 16 04/19/2020 1046   LDLCALC 139 (H) 04/19/2020 1046   LDLCALC 57 12/29/2019 0935      Wt Readings from Last 3 Encounters:  04/21/20 215 lb (97.5 kg)  02/11/20 219 lb (99.3 kg)  12/29/19 219 lb (99.3 kg)      Other studies Reviewed: Additional studies/ records that were reviewed today include:   Left heart catheterization 04/19/20:  Prox RCA-1 lesion is 70% stenosed.  Prox RCA-2  lesion is 70% stenosed.  Mid RCA lesion is 70% stenosed.  RV Branch lesion is 50% stenosed.  Ost LAD lesion is 80% stenosed.  Mid LM to Dist LM lesion is 45% stenosed.  1st LPL lesion is 30% stenosed.  1st Diag lesion is 30% stenosed.  The present study demonstrates no significant change in the previous 45 - 50% distal left main stenosis.  The LAD ostium has 80% stenosis. There is mild 30% narrowing in the first diagonal branch. Competitive filling is present in the mid LAD due to LIMA flow.  There was a large ramus immediate vessel with significant tortuosity without significant stenoses.  The left circumflex vessel is a dominant vessel which has mild luminal irregularity. There is 30% stenosis in the distal marginal branch.  The RCA is nondominant but diffusely diseased with stenoses of 70% in the proximal mid and mid distal segment with 70% marginal stenoses.  Patent LIMA to LAD.  Mild LV dysfunction with EF estimated in the 45 to 50% range. Appears to be mild anterolateral hypocontractility. LVEDP is  significantly elevated at 30 to 32 mm. There appears to be 2+ angiographic mitral regurgitation.  RECOMMENDATION: Increase medical therapy for underlying CAD. Diuresis with significant LVEDP elevation. 2D echo Doppler study to further assess systolic and diastolic function and mitral regurgitation. Smoking cessation is imperative. Aggressive blood pressure control and lipid management.  Diagnostic Dominance: Left     Echocardiogram:04/20/20: 1. Left ventricular ejection fraction, by estimation, is 50 to 55%. The  left ventricle has low normal function. The left ventricle has no regional  wall motion abnormalities. There is moderate left ventricular hypertrophy.  Left ventricular diastolic  parameters are consistent with Grade I diastolic dysfunction (impaired  relaxation).  2. Right ventricular systolic function is normal. The right ventricular  size is normal.  3. The mitral valve is normal in structure. Trivial mitral valve  regurgitation. No evidence of mitral stenosis.  4. The aortic valve is tricuspid. Aortic valve regurgitation is not  visualized. No aortic stenosis is present.  5. There is borderline dilatation of the aortic root, measuring 38 mm.  There is mild dilatation of the ascending aorta, measuring 39 mm.  6. The inferior vena cava is normal in size with greater than 50%  respiratory variability, suggesting right atrial pressure of 3 mmHg.     ASSESSMENT AND PLAN:  1.  ***   Current medicines are reviewed at length with the patient today.  The patient {ACTIONS; HAS/DOES NOT HAVE:19233} concerns regarding medicines.  The following changes have been made:  {PLAN; NO CHANGE:13088:s}  Labs/ tests ordered today include: *** No orders of the defined types were placed in this encounter.    Disposition:   FU with *** in {gen number 6-28:366294} {Days to years:10300}  Signed, Beatriz Stallion, PA-C  05/03/2020 2:30 PM

## 2020-05-04 ENCOUNTER — Other Ambulatory Visit: Payer: Self-pay

## 2020-05-08 ENCOUNTER — Other Ambulatory Visit: Payer: Self-pay | Admitting: *Deleted

## 2020-05-08 NOTE — Patient Outreach (Signed)
Care Coordination  05/08/2020  Edward Farmer Mar 17, 1961 [id number]    Medicaid Managed Care   Unsuccessful Outreach Note  05/08/2020 Name: Edward Farmer MRN: [id number] DOB: Aug 17, 1960  Referred by: [name], MD Reason for referral : High Risk Managed Medicaid (Unsuccessful initial outreach)   An unsuccessful telephone outreach was attempted today. The patient was referred to the case management team for assistance with care management and care coordination.   Follow Up Plan: RNCM will reach out again in 7-14 days for an initial outreach  Estanislado Emms RN, BSN [place]  [facility]

## 2020-05-08 NOTE — Patient Instructions (Signed)
Visit Information  Mr. Edward Farmer  - as a part of your Medicaid benefit, you are eligible for care management and care coordination services at no cost or copay. I was unable to reach you by phone today but would be happy to help you with your health related needs. Please feel free to call me @ [phone].   A member of the Managed Medicaid care management team will reach out to you again over the next 7-14 days.   Melanie Robb RN, BSN [place]  [facility] RN Care Coordinator   

## 2020-05-11 ENCOUNTER — Ambulatory Visit: Payer: Self-pay

## 2020-05-12 ENCOUNTER — Other Ambulatory Visit: Payer: Self-pay

## 2020-05-12 NOTE — Patient Outreach (Signed)
Medicaid Managed Care Social Work Note  05/12/2020 Name:  Edward Farmer MRN:  [id number] DOB:  1960-11-15  Edward Farmer is an 60 y.o. year old male who is a primary patient of Edward Field Charlcie Cradle, MD.  The Medicaid Managed Care Coordination team was consulted for assistance with:  Housing needs.  Edward Farmer was given information about Medicaid Managed CareCoordination services today. Edward Farmer agreed to services and verbal consent obtained.  Engaged with patient  for by telephone forfollow up visit in response to referral for case management and/or care coordination services.   Assessments/Interventions:  Review of past medical history, allergies, medications, health status, including review of consultants reports, laboratory and other test data, was performed as part of comprehensive evaluation and provision of chronic care management services.  SDOH: (Social Determinant of Health) assessments and interventions performed: BSW contacted patient to follow up on housing. Patient stated [employer] paid for him to stay where he is for another month. He is still in the living room of the home, but grateful that he is not out in the cold. Patient states he has not heard from [facility], every time he calls to check on it they say it is still under investigation.   BSW continues to research shelters and one bedroom apartments for patient. He states he still has not heard from [facility] about a bed.   Advanced Directives Status:  Not addressed in this encounter.  Care Plan                 No Known Allergies  Medications Reviewed Today    Reviewed by Edward Farmer, CPhT (Pharmacy Technician) on 04/19/20 at 0503  Med List Status: Complete  Medication Order Taking? Sig Documenting Provider Last Dose Status Informant  amLODipine (NORVASC) 10 MG tablet [id number] Yes Take 1 tablet (10 mg total) by mouth daily. [name], MD 04/18/2020 Unknown time Active Self  aspirin EC 81 MG  tablet [id number] Yes Take 1 tablet (81 mg total) by mouth daily. Swallow whole. [name], MD 04/18/2020 Unknown time Active Self  atorvastatin (LIPITOR) 20 MG tablet [id number] Yes Take 1 tablet (20 mg total) by mouth daily. [name], MD 04/18/2020 Unknown time Active Self  carvedilol (COREG) 25 MG tablet [id number] Yes Take 1 tablet (25 mg total) by mouth 2 (two) times daily with a meal. [name], MD 04/18/2020 0830 Active Self  hydrochlorothiazide (HYDRODIURIL) 25 MG tablet [id number] Yes Take 1 tablet (25 mg total) by mouth daily. [name], MD 04/18/2020 Unknown time Active Self  nicotine (NICODERM CQ - DOSED IN MG/24 HOURS) 14 mg/24hr patch [id number] No Place 1 patch (14 mg total) onto the skin daily.  Patient not taking: Reported on 04/19/2020   [name], MD Not Taking Unknown time Consider Medication Status and Discontinue Self  triamcinolone cream (KENALOG) 0.1 % [id number] No Apply 1 application topically 2 (two) times daily.  Patient not taking: Reported on 04/19/2020   [name], MD Completed Course Unknown time Active Self  Med List Note Edward Farmer, CPhT 07/14/16 1712):            Patient Active Problem List   Diagnosis Date Noted  . Hyperlipidemia 02/11/2020  . Prediabetes 02/10/2020  . 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 Essential hypertension 03/12/2018  . Tobacco dependence 03/12/2018  . Hx of CABG 07/17/2016  . Family  history of premature CAD 07/16/2016  . Unstable angina (HCC) 07/14/2016  . Asthma, chronic 01/27/2014  . Obesity, unspecified 01/27/2014    Conditions to be addressed/monitored per PCP order:  patient is currently focused on finding a permenant home.  Patient Care Plan: Interdisciplinary Collaboration/Care Coordination    Problem Identified: Collaborative Care Coordination   Priority: High  Onset Date: 04/25/2020  Note:    Current Barriers:  . Care Management support, education, and care coordination needs related to  housing assistance Case Manager Clinical Goal(s):  [name] Over the next 30 days, patient will work with BSW to address needs related to Housing barriers in patient with HTN Interventions:  . Collaborated with BSW to initiate plan of care to address needs related to Housing barriers in patient with HTN Patient Goals/ Self Care Activities:  . Patient will work with BSW to address care coordination needs and will continue to work with the clinical team to address health care and disease management related needs.    Follow up plan: RNCM will follow up with a telephone visit over the next 7 days.       Follow up:  Patient agrees to Care Plan and Follow-up.  Plan: The Managed Medicaid care management team will reach out to the patient again over the next 30 days.  Date/time of next scheduled Social Work care management/care coordination outreach:  06/12/20  Edward Farmer, BSW, MHA [facility]  [place]  High Risk Managed Medicaid Team

## 2020-05-12 NOTE — Patient Instructions (Signed)
Visit Information  Edward Farmer was given information about Medicaid Managed Care team care coordination services as a part of their [facility] Community Plan Medicaid benefit. Edward Farmer verbally consented to engagement with the [facility] Managed Care team.   For questions related to your [facility], please call: [phone] or visit the homepage here: [email or web address]  If you would like to schedule transportation through your [facility], please call the following number at least 2 days in advance of your appointment: [phone]  Goals Addressed   None    Patient Care Plan: Interdisciplinary Collaboration/Care Coordination    Problem Identified: Collaborative Care Coordination   Priority: High  Onset Date: 04/25/2020  Note:   Current Barriers:  . Care Management support, education, and care coordination needs related to  housing assistance Case Manager Clinical Goal(s):  [name] Over the next 30 days, patient will work with BSW to address needs related to Housing barriers in patient with HTN Interventions:  . Collaborated with BSW to initiate plan of care to address needs related to Housing barriers in patient with HTN Patient Goals/ Self Care Activities:  . Patient will work with BSW to address care coordination needs and will continue to work with the clinical team to address health care and disease management related needs.    Follow up plan: RNCM will follow up with a telephone visit over the next 7 days.         Social Worker will follow up with patient in 30 days.   [name]

## 2020-05-18 ENCOUNTER — Ambulatory Visit: Payer: Medicaid Other | Admitting: Critical Care Medicine

## 2020-05-18 NOTE — Progress Notes (Deleted)
Subjective:    Patient ID: Edward Farmer, male    DOB: 1961/02/02, 60 y.o.   MRN: [id number]  12/29/2019 This is a pleasant black male who I follow in the [facility] comes in today to establish for primary care.  He is 61 years old has a history of hypertension, coronary artery disease status post bypass graft in 2018.   The patient was started on amlodipine 10 mg daily Coreg 25 mg twice daily and hydrochlorthiazide 25 mg daily at the shelter clinic.  Note there is a family history of premature coronary disease.  The patient still has active smoking at this time.  Patient comes in on arrival with blood pressure 116/80.  He has had no complaints.  He is doing well from a medical standpoint.  He does have a history of hepatitis C but was treated for this.  He is down to 3 cigarettes daily and wishes a refill on his nicotine patch.  He is committed to quitting smoking at this time.  Note the patient did have a Covid vaccine series already.  However he is declining a flu vaccine at this visit wishes to think about this.  He does wish to have an albuterol refill because he has history of asthma and the heat has been difficult for his breathing    The patient is on atorvastatin 20 mg daily and needs a follow-up of lipid panel  The patient will need cardiology follow-up as he is not seen cardiology in over 3 years  05/18/2020 Essential hypertension Essential hypertension under good control will refill current medications and not make further changes  We will obtain complete metabolic panel complete blood count and thyroid study  Asthma, chronic Asthma stable at this time we will refill the patient's albuterol  Obesity, unspecified Dietary recommendations were given for hypertension and also to lose weight  Tobacco dependence    Current smoking consumption amount: 3 to 5 cigarettes daily  Dicsussion on advise to quit smoking and smoking impacts: Impacts on  cardiovascular and lung health  Patient's willingness to quit: Patient wishes to quit now  Methods to quit smoking discussed: Behavioral modification and nicotine replacement therapy  Medication management of smoking session drugs discussed: Recommending nicotine patch and the patient wishes a prescription  Resources provided:  AVS   Setting quit date quit date being set within the next month  Follow-up arranged follow-up in the clinic in   Time spent counseling the patient:      Edward Farmer was seen today for establish care.  Diagnoses and all orders for this visit:  Encounter to establish care  Essential hypertension -     CBC with Differential -     Comprehensive metabolic panel -     Lipid Panel -     Thyroid Panel With TSH  Elevated lipoprotein(a) -     Lipid Panel  History of prediabetes -     Hemoglobin A1c  Colon cancer screening -     Cancel: Ambulatory referral to Gastroenterology -     Cologuard  Tobacco dependence -     nicotine (NICODERM CQ - DOSED IN MG/24 HOURS) 14 mg/24hr patch; Place 1 patch (14 mg total) onto the skin daily.  Mild intermittent chronic asthma without complication  Class 1 obesity due to excess calories without serious comorbidity with body mass index (BMI) of 32.0 to 32.9 in adult  Other orders -     triamcinolone cream (KENALOG) 0.1 %; Apply  1 application topically 2 (two) times daily.   Past Medical History:  Diagnosis Date  . Asthma   . Bradycardia 07/14/2016   on BB. Rate improved with discontinuation.   . Coronary artery disease   . Hypertension   . Tobacco abuse      Family History  Problem Relation Age of Onset  . Migraines Mother   . Heart attack Father      Social History   Socioeconomic History  . Marital status: Legally Separated    Spouse name: Not on file  . Number of children: Not on file  . Years of education: Not on file  . Highest education level: Not on file  Occupational History  . Not on file   Tobacco Use  . Smoking status: Current Every Day Smoker    Packs/day: 0.25    Types: Cigars  . Smokeless tobacco: Never Used  . Tobacco comment: cutting back  Vaping Use  . Vaping Use: Never used  Substance and Sexual Activity  . Alcohol use: Yes    Alcohol/week: 0.0 standard drinks    Comment: occ  . Drug use: No  . Sexual activity: Not on file  Other Topics Concern  . Not on file  Social History Narrative  . Not on file   Social Determinants of Health   Financial Resource Strain: Not on file  Food Insecurity: Not on file  Transportation Needs: No Transportation Needs  . Lack of Transportation (Medical): No  . Lack of Transportation (Non-Medical): No  Physical Activity: Not on file  Stress: Not on file  Social Connections: Not on file  Intimate Partner Violence: Not on file     No Known Allergies   Outpatient Medications Prior to Visit  Medication Sig Dispense Refill  . amLODipine (NORVASC) 10 MG tablet Take 1 tablet (10 mg total) by mouth daily. 90 tablet 3  . aspirin EC 81 MG tablet Take 1 tablet (81 mg total) by mouth daily. Swallow whole. 30 tablet 11  . atorvastatin (LIPITOR) 80 MG tablet Take 1 tablet (80 mg total) by mouth daily at 6 PM. 90 tablet 3  . carvedilol (COREG) 25 MG tablet Take 1 tablet (25 mg total) by mouth 2 (two) times daily with a meal. 180 tablet 3  . hydrochlorothiazide (HYDRODIURIL) 25 MG tablet Take 1 tablet (25 mg total) by mouth daily. 90 tablet 3  . losartan (COZAAR) 50 MG tablet Take 1 tablet (50 mg total) by mouth daily. 90 tablet 3  . nicotine (NICODERM CQ - DOSED IN MG/24 HOURS) 14 mg/24hr patch Place 1 patch (14 mg total) onto the skin daily. (Patient not taking: Reported on 04/19/2020) 28 patch 1  . nitroGLYCERIN (NITROSTAT) 0.4 MG SL tablet Place 1 tablet (0.4 mg total) under the tongue every 5 (five) minutes x 3 doses as needed for chest pain. 25 tablet 3   No facility-administered medications prior to visit.     Review of  Systems  Constitutional: Negative.   HENT: Negative.   Respiratory: Positive for shortness of breath and wheezing.   Endocrine: Negative.   Genitourinary: Negative.   Musculoskeletal: Negative.   Skin: Positive for color change. Negative for rash.  Neurological: Negative.   Hematological: Negative.   Psychiatric/Behavioral: Positive for dysphoric mood. Negative for agitation, behavioral problems, confusion, hallucinations, self-injury, sleep disturbance and suicidal ideas. The patient is not nervous/anxious and is not hyperactive.        Objective:   Physical Exam There were no vitals  filed for this visit.  Gen: Pleasant, well-nourished, in no distress,  normal affect  ENT: No lesions,  mouth clear,  oropharynx clear, no postnasal drip  Neck: No JVD, no TMG, no carotid bruits  Lungs: No use of accessory muscles, no dullness to percussion, clear without rales or rhonchi  Cardiovascular: RRR, heart sounds normal, no murmur or gallops, no peripheral edema  Abdomen: soft and NT, no HSM,  BS normal  Musculoskeletal: No deformities, no cyanosis or clubbing  Neuro: alert, non focal  Skin: Warm, no lesions or rashes  No results found.        Assessment & Plan:  I personally reviewed all images and lab data in the [facility] system as well as any outside material available during this office visit and agree with the  radiology impressions.   No problem-specific Assessment & Plan notes found for this encounter.   There are no diagnoses linked to this encounter.

## 2020-05-29 ENCOUNTER — Other Ambulatory Visit: Payer: Self-pay | Admitting: Pharmacist Clinician (PhC)/ Clinical Pharmacy Specialist

## 2020-05-29 ENCOUNTER — Other Ambulatory Visit: Payer: Self-pay

## 2020-05-29 DIAGNOSIS — I25718 Atherosclerosis of autologous vein coronary artery bypass graft(s) with other forms of angina pectoris: Secondary | ICD-10-CM

## 2020-05-29 DIAGNOSIS — I10 Essential (primary) hypertension: Secondary | ICD-10-CM

## 2020-05-29 MED ORDER — HYDROCHLOROTHIAZIDE 25 MG PO TABS: 25.0000 mg | ORAL_TABLET | Freq: Every day | ORAL | 3 refills | Status: DC

## 2020-05-29 MED ORDER — ATORVASTATIN CALCIUM 80 MG PO TABS: 80.0000 mg | ORAL_TABLET | Freq: Every day | ORAL | 3 refills | Status: DC

## 2020-05-29 MED ORDER — CARVEDILOL 25 MG PO TABS: 25.0000 mg | ORAL_TABLET | Freq: Two times a day (BID) | ORAL | 3 refills | Status: DC

## 2020-05-29 MED ORDER — AMLODIPINE BESYLATE 10 MG PO TABS: 10.0000 mg | ORAL_TABLET | Freq: Every day | ORAL | 3 refills | Status: DC

## 2020-05-29 MED ORDER — NITROGLYCERIN 0.4 MG SL SUBL: 0.4000 mg | SUBLINGUAL_TABLET | SUBLINGUAL | 3 refills | Status: DC | PRN

## 2020-05-29 MED ORDER — LOSARTAN POTASSIUM 50 MG PO TABS: 50.0000 mg | ORAL_TABLET | Freq: Every day | ORAL | 3 refills | Status: DC

## 2020-05-29 MED ORDER — AMLODIPINE BESYLATE 10 MG PO TABS
10.0000 mg | ORAL_TABLET | Freq: Every day | ORAL | 3 refills | Status: DC
Start: 2020-05-29 — End: 2020-05-29

## 2020-05-29 MED ORDER — ASPIRIN EC 81 MG PO TBEC: 81.0000 mg | DELAYED_RELEASE_TABLET | Freq: Every day | ORAL | 3 refills | Status: DC

## 2020-05-29 MED ORDER — ASPIRIN EC 81 MG PO TBEC: 81.0000 mg | DELAYED_RELEASE_TABLET | Freq: Every day | ORAL | 11 refills | Status: DC

## 2020-05-29 NOTE — Addendum Note (Signed)
Addended by: Shan Levans E on: 05/29/2020 10:59 AM   Modules accepted: Orders

## 2020-05-29 NOTE — Patient Instructions (Signed)
Visit Information  Mr. Edward Farmer was given information about Medicaid Managed Care team care coordination services as a part of their [facility] Community Plan Medicaid benefit. Edward Farmer verbally consented to engagement with the [facility] Managed Care team.   For questions related to your [facility], please call: [phone] or visit the homepage here: [email or web address]  If you would like to schedule transportation through your [facility], please call the following number at least 2 days in advance of your appointment: [phone]  Mr. Edward Farmer - following are the goals we discussed in your visit today:  Goals Addressed            This Visit's Progress   . Afford my Medications       Timeframe:  Short-Term Goal Priority:  High Start Date:              Today               Expected End Date:          Always             Follow Up Date Monthly   - call for medicine refill 2 or 3 days before it runs out    Why is this important?   . These steps will help you keep on track with your medicines.   Notes: Patient can't afford medications and can't pick them up from the Pharmacy (Due to transportation, not having a car)       Please see education materials related to HTN provided as print materials.   Patient verbalizes understanding of instructions provided today.   The Managed Medicaid care management team will reach out to the patient again over the next 30 days.   Edward Farmer, [facility]  Following is a copy of your plan of care:  Patient Care Plan: Interdisciplinary Collaboration/Care Coordination    Problem Identified: Collaborative Care Coordination   Priority: High  Onset Date: 04/25/2020  Note:   Current Barriers:  . Care Management support, education, and care coordination needs related to  housing assistance Case Manager Clinical Goal(s):  Edward Farmer Over the next 30  days, patient will work with BSW to address needs related to Housing barriers in patient with HTN Interventions:  . Collaborated with BSW to initiate plan of care to address needs related to Housing barriers in patient with HTN Patient Goals/ Self Care Activities:  . Patient will work with BSW to address care coordination needs and will continue to work with the clinical team to address health care and disease management related needs.    Follow up plan: RNCM will follow up with a telephone visit over the next 7 days.     Patient Care Plan: Medication Management    Problem Identified: Health Promotion or Disease Self-Management (General Plan of Care)     Goal: Medication Management   Note:   Current Barriers:  . Unable to independently afford treatment regimen . Unable to independently monitor therapeutic efficacy . Unable to self administer medications as prescribed . Does not adhere to prescribed medication regimen .   Pharmacist Clinical Goal(s):  Edward Farmer Over the next 30days, patient will verbalize ability to afford treatment regimen . contact provider office for questions/concerns as evidenced notation of same in electronic health record through collaboration with PharmD and provider.  .   Interventions: . Inter-disciplinary care team collaboration (see longitudinal plan of care) . Comprehensive medication review performed; medication list  updated in electronic medical record  Verbal consent obtained for [facility] enhanced pharmacy services (medication synchronization, adherence packaging, delivery coordination). A medication sync plan was created to allow patient to get all medications delivered once every 30 to 90 days per patient preference. Patient understands they have freedom to choose pharmacy and clinical pharmacist will coordinate care between all prescribers and [facility].   @RXCPHYPERTENSION @ @RXCPHYPERLIPIDEMIA @  Patient Goals/Self-Care  Activities . Over the next 30 days, patient will:  - take medications as prescribed collaborate with provider on medication access solutions  Follow Up Plan: The care management team will reach out to the patient again over the next 30 days.     Task: Mutually Develop and Achievement of Patient Goals   Note:   Care Management Activities:    - barriers to meeting goals identified - choices provided - difficulty of making life-long changes acknowledged    Notes:

## 2020-05-29 NOTE — Progress Notes (Signed)
[facility]

## 2020-05-29 NOTE — Patient Outreach (Signed)
Medicaid Managed Care    Pharmacy Note  05/29/2020 Name: Edward Farmer MRN: [id number] DOB: August 01, 1960  Edward Farmer is a 60 y.o. year old male who is a primary care patient of Delford Field Charlcie Cradle, MD. The [facility] Managed Care Coordination team was consulted for assistance with disease management and care coordination needs.    Engaged with patient Engaged with patient by telephone for follow up visit in response to referral for case management and/or care coordination services.  Mr. Gallicchio was given information about Managed Medicaid Care Coordination team services today. Edward Farmer agreed to services and verbal consent obtained.   Objective:  Lab Results  Component Value Date   CREATININE 1.04 04/21/2020   CREATININE 1.09 04/20/2020   CREATININE 1.10 04/19/2020    Lab Results  Component Value Date   HGBA1C 6.0 (H) 04/19/2020       Component Value Date/Time   CHOL 186 04/19/2020 1046   CHOL 117 12/29/2019 0935   TRIG 79 04/19/2020 1046   HDL 31 (L) 04/19/2020 1046   HDL 38 (L) 12/29/2019 0935   CHOLHDL 6.0 04/19/2020 1046   VLDL 16 04/19/2020 1046   LDLCALC 139 (H) 04/19/2020 1046   LDLCALC 57 12/29/2019 0935    Other: (TSH, CBC, Vit D, etc.)  Clinical ASCVD: Yes  The 10-year ASCVD risk score [name], et al., 2013) is: 27.8%   Values used to calculate the score:     Age: 33 years     Sex: Male     Is Non-Hispanic African American: Yes     Diabetic: No     Tobacco smoker: Yes     Systolic Blood Pressure: 138 mmHg     Is BP treated: Yes     HDL Cholesterol: 31 mg/dL     Total Cholesterol: 186 mg/dL    Other: ([id number] if Afib, PHQ9 if depression, MMRC or CAT for COPD, ACT, DEXA)  BP Readings from Last 3 Encounters:  04/21/20 138/85  02/11/20 (!) 178/117  12/29/19 116/79    Assessment/Interventions: Review of patient past medical history, allergies, medications, health status, including review of consultants reports, laboratory and other test data,  was performed as part of comprehensive evaluation and provision of chronic care management services.   Cardio    BP Readings from Last 3 Encounters:  04/21/20 138/85  02/11/20 (!) 178/117  12/29/19 116/79    BP: Doesn't check Amlodipine 10mg  ASA 81 Carvedilol 25mg  HCTZ 25mg  Losartan 50mg  December 2021: Patient is facing potential homelessness right now, this is his main focus/priority. When I called, he was walking around trying to find new employment/housing (Lost job after previous Sx and now could lose home). Will focus on housing, then BP. Patient seems compliant on all meds after scare of last Sx Jan 2022: Found housing with . Was able to afford meds last week per patient but when I called both pharmacies said LF was mid December  Lipids      Lab Results  Component Value Date   CHOL 186 04/19/2020   CHOL 117 12/29/2019   CHOL 177 03/12/2018        Lab Results  Component Value Date   HDL 31 (L) 04/19/2020   HDL 38 (L) 12/29/2019   HDL 39 (L) 03/12/2018        Lab Results  Component Value Date   LDLCALC 139 (H) 04/19/2020   LDLCALC 57 12/29/2019   LDLCALC 116 (H) 03/12/2018  Lab Results  Component Value Date   TRIG 79 04/19/2020   TRIG 120 12/29/2019   TRIG 108 03/12/2018        Lab Results  Component Value Date   CHOLHDL 6.0 04/19/2020   CHOLHDL 3.1 12/29/2019   CHOLHDL 4.5 03/12/2018   No results found for: LDLDIRECT  Atorvastatin 80mg  December 2021: Patient is facing potential homelessness right now, this is his main focus/priority. When I called, he was walking around trying to find new employment/housing (Lost job after previous Sx and now could lose home). Will focus on housing, then BP. Patient seems compliant on all meds after scare of last Sx Jan 2022: Found housing with Feb 2022. Was able to afford meds last week per patient but when I called both pharmacies said LF was mid  December   Meds: -Per fill Fx, patient wasn't compliant on summer but states after his Sx that he is compliant now -Patient doesn't have a car and current pharmacy doesn't deliver Plan: Verbal consent obtained for [facility] enhanced pharmacy services (medication synchronization, adherence packaging, delivery coordination). A medication sync plan was created to allow patient to get all medications delivered once every 30 to 90 days per patient preference. Patient understands they have freedom to choose pharmacy and clinical pharmacist will coordinate care between all prescribers and [facility].      SDOH (Social Determinants of Health) assessments and interventions performed:    Care Plan  No Known Allergies  Medications Reviewed Today    Reviewed by January, [facility] (Pharmacist) on 05/29/20 at 1009  Med List Status: <None>  Medication Order Taking? Sig Documenting Provider Last Dose Status Informant  amLODipine (NORVASC) 10 MG tablet 05/31/20 Yes Take 1 tablet (10 mg total) by mouth daily. [id number]., PA-C Taking Active   aspirin EC 81 MG tablet Beatriz Stallion Yes Take 1 tablet (81 mg total) by mouth daily. Swallow whole. [id number], MD Taking Active Self  atorvastatin (LIPITOR) 80 MG tablet [name] Yes Take 1 tablet (80 mg total) by mouth daily at 6 PM. [id number], Riley Nearing., PA-C Taking Active   carvedilol (COREG) 25 MG tablet [name] Yes Take 1 tablet (25 mg total) by mouth 2 (two) times daily with a meal. [id number], MD Taking Active Self  hydrochlorothiazide (HYDRODIURIL) 25 MG tablet [name] Yes Take 1 tablet (25 mg total) by mouth daily. [id number], MD Taking Active Self  losartan (COZAAR) 50 MG tablet [name] Yes Take 1 tablet (50 mg total) by mouth daily. Kroeger, [id number]., PA-C Taking Active   nicotine (NICODERM CQ - DOSED IN MG/24 HOURS) 14 mg/24hr patch [name] No Place 1 patch (14 mg total) onto the skin daily.  Patient  not taking: No sig reported   [id number], MD Not Taking Consider Medication Status and Discontinue Self  nitroGLYCERIN (NITROSTAT) 0.4 MG SL tablet [name] Yes Place 1 tablet (0.4 mg total) under the tongue every 5 (five) minutes x 3 doses as needed for chest pain. [id number]., PA-C Taking Active   Med List Note Beatriz Stallion, CPhT 07/14/16 1712):            Patient Active Problem List   Diagnosis Date Noted  . Hyperlipidemia 02/11/2020  . Prediabetes 02/10/2020  . 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 Essential hypertension 03/12/2018  . Tobacco dependence 03/12/2018  . Hx of CABG 07/17/2016  . Family history of  premature CAD 07/16/2016  . Unstable angina (HCC) 07/14/2016  . Asthma, chronic 01/27/2014  . Obesity, unspecified 01/27/2014    Conditions to be addressed/monitored: HTN and Hypertriglyceridemia  Care Plan : Medication Management  Updates made by [name], RPH since 05/29/2020 12:00 AM    Problem: Health Promotion or Disease Self-Management (General Plan of Care)     Goal: Medication Management   Note:   Current Barriers:  . Unable to independently afford treatment regimen . Unable to independently monitor therapeutic efficacy . Unable to self administer medications as prescribed . Does not adhere to prescribed medication regimen .   Pharmacist Clinical Goal(s):  [name] Over the next 30days, patient will verbalize ability to afford treatment regimen . contact provider office for questions/concerns as evidenced notation of same in electronic health record through collaboration with PharmD and provider.  .   Interventions: . Inter-disciplinary care team collaboration (see longitudinal plan of care) . Comprehensive medication review performed; medication list updated in electronic medical record  Verbal consent obtained for [facility] enhanced pharmacy services  (medication synchronization, adherence packaging, delivery coordination). A medication sync plan was created to allow patient to get all medications delivered once every 30 to 90 days per patient preference. Patient understands they have freedom to choose pharmacy and clinical pharmacist will coordinate care between all prescribers and [facility].   @RXCPHYPERTENSION @ @RXCPHYPERLIPIDEMIA @  Patient Goals/Self-Care Activities . Over the next 30 days, patient will:  - take medications as prescribed collaborate with provider on medication access solutions  Follow Up Plan: The care management team will reach out to the patient again over the next 30 days.     Task: Mutually Develop and Achievement of Patient Goals   Note:   Care Management Activities:    - verbalization of feelings encouraged    Notes:      Medication Assistance: Utilizing Enhanced Pharmacy Services (Delivery)  Follow up: Agree   Plan: The care management team will reach out to the patient again over the next 30 days.   , Pharm.D., Managed Medicaid Pharmacist - [phone]

## 2020-06-12 ENCOUNTER — Other Ambulatory Visit: Payer: Self-pay

## 2020-06-12 ENCOUNTER — Other Ambulatory Visit: Payer: Self-pay | Admitting: *Deleted

## 2020-06-12 NOTE — Patient Outreach (Signed)
Medicaid Managed Care Social Work Note  06/12/2020 Name:  Edward Farmer MRN:  [id number] DOB:  1960/07/30  Edward Farmer is an 60 y.o. year old male who is a primary patient of Edward Field Charlcie Cradle, MD.  The [facility] Managed Care Coordination team was consulted for assistance with:  housing  Edward Farmer was given information about Medicaid Managed CareCoordination services today. Edward Farmer agreed to services and verbal consent obtained.  Engaged with patient  for by telephone forfollow up visit in response to referral for case management and/or care coordination services.   Assessments/Interventions:  Review of past medical history, allergies, medications, health status, including review of consultants reports, laboratory and other test data, was performed as part of comprehensive evaluation and provision of chronic care management services.  SDOH: (Social Determinant of Health) assessments and interventions performed:   Patient stated he is still living in the same place. He has another month there. Patient states he has been unable to get [employer] on the phone, he continues to be on the waitlist.  Advanced Directives Status:  Not addressed in this encounter.  Care Plan                 No Known Allergies  Medications Reviewed Today    Reviewed by Edward Farmer, [facility] (Pharmacist) on 05/29/20 at 1009  Med List Status: <None>  Medication Order Taking? Sig Documenting Provider Last Dose Status Informant  amLODipine (NORVASC) 10 MG tablet [id number] Yes Take 1 tablet (10 mg total) by mouth daily. Beatriz Stallion., PA-C Taking Active   aspirin EC 81 MG tablet [id number] Yes Take 1 tablet (81 mg total) by mouth daily. Swallow whole. [name], MD Taking Active Self  atorvastatin (LIPITOR) 80 MG tablet [id number] Yes Take 1 tablet (80 mg total) by mouth daily at 6 PM. Riley Nearing, [name]., PA-C Taking Active   carvedilol (COREG) 25 MG tablet [id number] Yes Take 1 tablet (25 mg total)  by mouth 2 (two) times daily with a meal. [name], MD Taking Active Self  hydrochlorothiazide (HYDRODIURIL) 25 MG tablet [id number] Yes Take 1 tablet (25 mg total) by mouth daily. [name], MD Taking Active Self  losartan (COZAAR) 50 MG tablet [id number] Yes Take 1 tablet (50 mg total) by mouth daily. Kroeger, [name]., PA-C Taking Active   nicotine (NICODERM CQ - DOSED IN MG/24 HOURS) 14 mg/24hr patch [id number] No Place 1 patch (14 mg total) onto the skin daily.  Patient not taking: No sig reported   [name], MD Not Taking Consider Medication Status and Discontinue Self  nitroGLYCERIN (NITROSTAT) 0.4 MG SL tablet [id number] Yes Place 1 tablet (0.4 mg total) under the tongue every 5 (five) minutes x 3 doses as needed for chest pain. Beatriz Stallion., PA-C Taking Active   Med List Note Edward Farmer, CPhT 07/14/16 1712):            Patient Active Problem List   Diagnosis Date Noted  . Hyperlipidemia 02/11/2020  . Prediabetes 02/10/2020  . 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 Essential hypertension 03/12/2018  . Tobacco dependence 03/12/2018  . Hx of CABG 07/17/2016  . Family history of premature CAD 07/16/2016  . Unstable angina (HCC) 07/14/2016  . Asthma, chronic 01/27/2014  . Obesity, unspecified 01/27/2014    Conditions to be addressed/monitored per PCP order:  housing  There are no care plans that you  recently modified to display for this patient.   Follow up:  Patient agrees to Care Plan and Follow-up.  Plan: The Managed Medicaid care management team will reach out to the patient again over the next 30 days.  Date/time of next scheduled Social Work care management/care coordination outreach:  07/10/2020  Edward Farmer, BSW, MHA [facility]  [place]  High Risk Managed Medicaid Team

## 2020-06-12 NOTE — Patient Outreach (Signed)
Care Coordination  06/12/2020  AMISH MINTZER 01-04-1961 [id number]   RNCM had a successful outreach today with Mr. Sheehan. However, he was busy at this time and requested to be called during the morning on another day. Telephone visit rescheduled for 06/16/20 @ 9am. Mr Hlavaty agreed to this time and date. RNCM will reach out again at this scheduled time.   Estanislado Emms RN, BSN [place]  [facility]

## 2020-06-12 NOTE — Patient Instructions (Signed)
Visit Information  Edward Farmer was given information about Medicaid Managed Care team care coordination services as a part of their [facility] Community Plan Medicaid benefit. Edward Farmer verbally consented to engagement with the [facility] Managed Care team.   For questions related to your [facility], please call: [phone] or visit the homepage here: [email or web address]  If you would like to schedule transportation through your [facility], please call the following number at least 2 days in advance of your appointment: [phone]  Edward Farmer - following are the goals we discussed in your visit today:  Goals Addressed   None       Social Worker will follow up in 30 days.   Edward Farmer  Following is a copy of your plan of care:

## 2020-06-16 ENCOUNTER — Other Ambulatory Visit: Payer: Self-pay | Admitting: *Deleted

## 2020-06-16 NOTE — Patient Instructions (Signed)
Visit Information  Mr. ARCH METHOT  - as a part of your Medicaid benefit, you are eligible for care management and care coordination services at no cost or copay. I was unable to reach you by phone today but would be happy to help you with your health related needs. Please feel free to call me @ [phone].   A member of the Managed Medicaid care management team will reach out to you again over the next 7-14 days.   Estanislado Emms RN, BSN [place]  [facility]

## 2020-06-16 NOTE — Patient Outreach (Signed)
Care Coordination  06/16/2020  Edward Farmer Apr 17, 1961 [id number]    Medicaid Managed Care   Unsuccessful Outreach Note  06/16/2020 Name: Edward Farmer MRN: [id number] DOB: 1961/01/24  Referred by: [name], MD Reason for referral : High Risk Managed Medicaid (Unsuccessful RNCM initial outreach)   An unsuccessful telephone outreach was attempted today. The patient was referred to the case management team for assistance with care management and care coordination.   Follow Up Plan: The care management team will reach out to the patient again over the next 7-14 days.   Estanislado Emms RN, BSN [place]  [facility]

## 2020-06-22 ENCOUNTER — Other Ambulatory Visit: Payer: Self-pay

## 2020-07-03 ENCOUNTER — Other Ambulatory Visit: Payer: Self-pay

## 2020-07-05 ENCOUNTER — Ambulatory Visit: Payer: Medicaid Other | Admitting: Critical Care Medicine

## 2020-07-05 ENCOUNTER — Other Ambulatory Visit: Payer: Self-pay

## 2020-07-05 NOTE — Progress Notes (Deleted)
Subjective:    Patient ID: Edward Farmer, male    DOB: 07/29/1960, 60 y.o.   MRN: [id number]  12/29/2019 This is a pleasant black male who I follow in the [facility] clinic comes in today to establish for primary care.  He is 60 years old has a history of hypertension, coronary artery disease status post bypass graft in 2018.   The patient was started on amlodipine 10 mg daily Coreg 25 mg twice daily and hydrochlorthiazide 25 mg daily at the shelter clinic.  Note there is a family history of premature coronary disease.  The patient still has active smoking at this time.  Patient comes in on arrival with blood pressure 116/80.  He has had no complaints.  He is doing well from a medical standpoint.  He does have a history of hepatitis C but was treated for this.  He is down to 3 cigarettes daily and wishes a refill on his nicotine patch.  He is committed to quitting smoking at this time.  Note the patient did have a Covid vaccine series already.  However he is declining a flu vaccine at this visit wishes to think about this.  He does wish to have an albuterol refill because he has history of asthma and the heat has been difficult for his breathing    The patient is on atorvastatin 20 mg daily and needs a follow-up of lipid panel  The patient will need cardiology follow-up as he is not seen cardiology in over 3 years  07/05/2020  Essential hypertension Essential hypertension under good control will refill current medications and not make further changes  We will obtain complete metabolic panel complete blood count and thyroid study  Asthma, chronic Asthma stable at this time we will refill the patient's albuterol  Obesity, unspecified Dietary recommendations were given for hypertension and also to lose weight  Tobacco dependence    Current smoking consumption amount: 3 to 5 cigarettes daily  Dicsussion on advise to quit smoking and smoking impacts: Impacts on  cardiovascular and lung health  Patient's willingness to quit: Patient wishes to quit now  Methods to quit smoking discussed: Behavioral modification and nicotine replacement therapy  Medication management of smoking session drugs discussed: Recommending nicotine patch and the patient wishes a prescription  Resources provided:  AVS   Setting quit date quit date being set within the next month  Follow-up arranged follow-up in the clinic in   Time spent counseling the patient:      [name] was seen today for establish care.  Diagnoses and all orders for this visit:  Encounter to establish care  Essential hypertension -     CBC with Differential -     Comprehensive metabolic panel -     Lipid Panel -     Thyroid Panel With TSH  Elevated lipoprotein(a) -     Lipid Panel  History of prediabetes -     Hemoglobin A1c  Colon cancer screening -     Cancel: Ambulatory referral to Gastroenterology -     Cologuard  Tobacco dependence -     nicotine (NICODERM CQ - DOSED IN MG/24 HOURS) 14 mg/24hr patch; Place 1 patch (14 mg total) onto the skin daily.  Mild intermittent chronic asthma without complication  Class 1 obesity due to excess calories without serious comorbidity with body mass index (BMI) of 32.0 to 32.9 in adult  Other orders -     triamcinolone cream (KENALOG) 0.1 %;  Apply 1 application topically 2 (two) times daily.     Past Medical History:  Diagnosis Date  . Asthma   . Bradycardia 07/14/2016   on BB. Rate improved with discontinuation.   . Coronary artery disease   . Hypertension   . Tobacco abuse      Family History  Problem Relation Age of Onset  . Migraines Mother   . Heart attack Father      Social History   Socioeconomic History  . Marital status: Legally Separated    Spouse name: Not on file  . Number of children: Not on file  . Years of education: Not on file  . Highest education level: Not on file  Occupational History  . Not on  file  Tobacco Use  . Smoking status: Current Every Day Smoker    Packs/day: 0.25    Types: Cigars  . Smokeless tobacco: Never Used  . Tobacco comment: cutting back  Vaping Use  . Vaping Use: Never used  Substance and Sexual Activity  . Alcohol use: Yes    Alcohol/week: 0.0 standard drinks    Comment: occ  . Drug use: No  . Sexual activity: Not on file  Other Topics Concern  . Not on file  Social History Narrative  . Not on file   Social Determinants of Health   Financial Resource Strain: Not on file  Food Insecurity: Not on file  Transportation Needs: No Transportation Needs  . Lack of Transportation (Medical): No  . Lack of Transportation (Non-Medical): No  Physical Activity: Not on file  Stress: Not on file  Social Connections: Not on file  Intimate Partner Violence: Not on file     No Known Allergies   Outpatient Medications Prior to Visit  Medication Sig Dispense Refill  . amLODipine (NORVASC) 10 MG tablet Take 1 tablet (10 mg total) by mouth daily. 90 tablet 3  . aspirin EC 81 MG tablet Take 1 tablet (81 mg total) by mouth daily. Swallow whole. 90 tablet 3  . atorvastatin (LIPITOR) 80 MG tablet Take 1 tablet (80 mg total) by mouth daily at 6 PM. 90 tablet 3  . carvedilol (COREG) 25 MG tablet Take 1 tablet (25 mg total) by mouth 2 (two) times daily with a meal. 180 tablet 3  . hydrochlorothiazide (HYDRODIURIL) 25 MG tablet Take 1 tablet (25 mg total) by mouth daily. 90 tablet 3  . losartan (COZAAR) 50 MG tablet Take 1 tablet (50 mg total) by mouth daily. 90 tablet 3  . nitroGLYCERIN (NITROSTAT) 0.4 MG SL tablet Place 1 tablet (0.4 mg total) under the tongue every 5 (five) minutes x 3 doses as needed for chest pain. 25 tablet 3   No facility-administered medications prior to visit.     Review of Systems  Constitutional: Negative.   HENT: Negative.   Respiratory: Positive for shortness of breath and wheezing.   Endocrine: Negative.   Genitourinary: Negative.    Musculoskeletal: Negative.   Skin: Positive for color change. Negative for rash.  Neurological: Negative.   Hematological: Negative.   Psychiatric/Behavioral: Positive for dysphoric mood. Negative for agitation, behavioral problems, confusion, hallucinations, self-injury, sleep disturbance and suicidal ideas. The patient is not nervous/anxious and is not hyperactive.        Objective:   Physical Exam There were no vitals filed for this visit.  Gen: Pleasant, well-nourished, in no distress,  normal affect  ENT: No lesions,  mouth clear,  oropharynx clear, no postnasal drip  Neck:  No JVD, no TMG, no carotid bruits  Lungs: No use of accessory muscles, no dullness to percussion, clear without rales or rhonchi  Cardiovascular: RRR, heart sounds normal, no murmur or gallops, no peripheral edema  Abdomen: soft and NT, no HSM,  BS normal  Musculoskeletal: No deformities, no cyanosis or clubbing  Neuro: alert, non focal  Skin: Warm, no lesions or rashes  No results found.        Assessment & Plan:  I personally reviewed all images and lab data in the [facility] system as well as any outside material available during this office visit and agree with the  radiology impressions.   No problem-specific Assessment & Plan notes found for this encounter.   There are no diagnoses linked to this encounter.

## 2020-07-05 NOTE — Patient Outreach (Signed)
Medicaid Managed Care Social Work Note  07/05/2020 Name:  Edward Farmer MRN:  [id number] DOB:  Aug 14, 1960  Edward Farmer is an 60 y.o. year old male who is a primary patient of Edward Field Charlcie Cradle, MD.  The [facility] Managed Care Coordination team was consulted for assistance with:  housing  Mr. Edward Farmer was given information about Medicaid Managed CareCoordination services today. Edward Farmer agreed to services and verbal consent obtained.  Engaged with patient  for by telephone forfollow up visit in response to referral for case management and/or care coordination services.   Assessments/Interventions:  Review of past medical history, allergies, medications, health status, including review of consultants reports, laboratory and other test data, was performed as part of comprehensive evaluation and provision of chronic care management services.  SDOH: (Social Determinant of Health) assessments and interventions performed:   Patient stated he is doing good. He is still living in the same place. He still has not received his money from [employer] yet, but has an appointment on 07/07/20 and is hoping to receive good new. Patient states he did have to scramble money to pay rent for this month, but he was able to pay it.  Advanced Directives Status:  Not addressed in this encounter.  Care Plan                 No Known Allergies  Medications Reviewed Today    Reviewed by Edward Farmer, [facility] (Pharmacist) on 05/29/20 at 1009  Med List Status: <None>  Medication Order Taking? Sig Documenting Provider Last Dose Status Informant  amLODipine (NORVASC) 10 MG tablet [id number] Yes Take 1 tablet (10 mg total) by mouth daily. Edward Farmer., PA-C Taking Active   aspirin EC 81 MG tablet [id number] Yes Take 1 tablet (81 mg total) by mouth daily. Swallow whole. Edward Frisk, MD Taking Active Self  atorvastatin (LIPITOR) 80 MG tablet [id number] Yes Take 1 tablet (80 mg total) by mouth daily at 6 PM.  Edward Farmer, Edward Farmer., PA-C Taking Active   carvedilol (COREG) 25 MG tablet [id number] Yes Take 1 tablet (25 mg total) by mouth 2 (two) times daily with a meal. Edward Frisk, MD Taking Active Self  hydrochlorothiazide (HYDRODIURIL) 25 MG tablet [id number] Yes Take 1 tablet (25 mg total) by mouth daily. Edward Frisk, MD Taking Active Self  losartan (COZAAR) 50 MG tablet [id number] Yes Take 1 tablet (50 mg total) by mouth daily. Edward Farmer, Edward Farmer., PA-C Taking Active   nicotine (NICODERM CQ - DOSED IN MG/24 HOURS) 14 mg/24hr patch [id number] No Place 1 patch (14 mg total) onto the skin daily.  Patient not taking: No sig reported   Edward Frisk, MD Not Taking Consider Medication Status and Discontinue Self  nitroGLYCERIN (NITROSTAT) 0.4 MG SL tablet [id number] Yes Place 1 tablet (0.4 mg total) under the tongue every 5 (five) minutes x 3 doses as needed for chest pain. Edward Farmer., PA-C Taking Active   Med List Note Edward Farmer, CPhT 07/14/16 1712):            Patient Active Problem List   Diagnosis Date Noted  . Hyperlipidemia 02/11/2020  . Prediabetes 02/10/2020  . 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 Essential hypertension 03/12/2018  . Tobacco dependence 03/12/2018  . Hx of CABG 07/17/2016  . Family history of premature CAD 07/16/2016  . Unstable angina (HCC) 07/14/2016  . Asthma, chronic 01/27/2014  .  Obesity, unspecified 01/27/2014    Conditions to be addressed/monitored per PCP order:  housing  There are no care plans that you recently modified to display for this patient.   Follow up:  Patient agrees to Care Plan and Follow-up.  Plan: The Managed Medicaid care management team will reach out to the patient again over the next 30 days.  Date/time of next scheduled Social Work care management/care coordination outreach:  08/10/20  Edward Farmer, BSW, MHA [facility]  [facility]  High Risk Managed Medicaid Team

## 2020-07-05 NOTE — Patient Instructions (Signed)
Visit Information  [name] was given information about Medicaid Managed Care team care coordination services as a part of their [facility] Community Plan Medicaid benefit. [name] verbally consented to engagement with the [facility] Managed Care team.   For questions related to your [facility], please call: [phone] or visit the homepage here: [email or web address]  If you would like to schedule transportation through your [facility], please call the following number at least 2 days in advance of your appointment: [phone].   [name] - following are the goals we discussed in your visit today:  Goals Addressed   None       Social Worker will follow up in 30 days.   [name]  Following is a copy of your plan of care:

## 2020-07-10 ENCOUNTER — Ambulatory Visit: Payer: Self-pay

## 2020-07-21 ENCOUNTER — Other Ambulatory Visit: Payer: Medicaid Other

## 2020-07-21 ENCOUNTER — Other Ambulatory Visit: Payer: Self-pay

## 2020-07-21 NOTE — Patient Outreach (Signed)
Compliant on meds, doesn't get paid until 1st of month. Meds due on 28th, would like them delivered on the 1st.   Coordinated with [employer] to get set up. F/U 1 Month

## 2020-08-04 ENCOUNTER — Other Ambulatory Visit: Payer: Self-pay | Admitting: *Deleted

## 2020-08-04 NOTE — Patient Instructions (Signed)
Visit Information  Mr. TAB RYLEE  - as a part of your Medicaid benefit, you are eligible for care management and care coordination services at no cost or copay. I was unable to reach you by phone today but would be happy to help you with your health related needs. Please feel free to call me @ [phone].   A member of the Managed Medicaid care management team will reach out to you again over the next 7-14 days.   Estanislado Emms RN, BSN [place]  [facility]

## 2020-08-04 NOTE — Patient Outreach (Signed)
Care Coordination  08/04/2020  Edward Farmer 23-Aug-1960 [id number]    Medicaid Managed Care   Unsuccessful Outreach Note  08/04/2020 Name: Edward Farmer MRN: [id number] DOB: 10-07-1960  Referred by: [name], MD Reason for referral : High Risk Managed Medicaid (Unsuccessful RNCM outreach)   A second unsuccessful telephone outreach was attempted today. The patient was referred to the case management team for assistance with care management and care coordination.   Follow Up Plan: A HIPAA compliant phone message was left for the patient providing contact information and requesting a return call.  The care management team will reach out to the patient again over the next 7-14 days.   Estanislado Emms RN, BSN [place]  [facility]

## 2020-08-09 NOTE — Congregational Nurse Program (Signed)
  Dept: [phone]   Congregational Nurse Program Note  Date of Encounter: 07/26/2020  Past Medical History: Past Medical History:  Diagnosis Date  . Asthma   . Bradycardia 07/14/2016   on BB. Rate improved with discontinuation.   . Coronary artery disease   . Hypertension   . Tobacco abuse     Encounter Details:  CNP Questionnaire - 08/09/20 1700      Questionnaire   Do you give verbal consent to treat you today? Yes    Visit Setting Other    Location Patient Served At [facility]    Patient Status Homeless    Medical Provider Yes    Insurance Medicaid    Intervention Counsel;Educate;Support    Housing/Utilities No permanent housing    Transportation Need transportation assistance    Referrals PCP -           Nurse introduced self to client as he was preparing to leave the site . Ask that he stop by Nursing office  When he returns to center to discuss role and ways I can be of assistance to him during his stay.  Client agrees,follow next week !

## 2020-08-09 NOTE — Congregational Nurse Program (Signed)
  Dept: [phone]   Congregational Nurse Program Note  Date of Encounter: 08/09/2020  Past Medical History: Past Medical History:  Diagnosis Date  . Asthma   . Bradycardia 07/14/2016   on BB. Rate improved with discontinuation.   . Coronary artery disease   . Hypertension   . Tobacco abuse     Encounter Details:  CNP Questionnaire - 08/09/20 1642      Questionnaire   Do you give verbal consent to treat you today? Yes    Visit Setting Other    Location Patient Served At [facility]    Patient Status Homeless    Medical Provider Yes    Insurance Medicaid    Intervention Counsel;Educate;Support    Housing/Utilities No permanent housing    Transportation Need transportation assistance    Referrals PCP -          Initial visit with nurse today states he is doing fairly well ,adjusting to [facility] ,states he has been homeless for about 10 years ,living here and there . He has lived with friends  And it works for [employer] .Has been to several shelters in [place] and surrounding area. .Depends on public transportation . Had heart surgery in [address] ? By pass surgery ,seen by Dr Delford Field at [facility] ,states his blood pressure is always high. Nurse to do chart review  No other known health issues. No formal support system , ,states his church is very supportive and is like his family  States he is very happy with where he is at present .Has set aside several goals  First to take advantage of his stay at the [facility] and save his money to get his own place. He states I will be getting my place to stay and not depending on others. client receives disability and may be able to work to supplement it when he gets his own place. On a scale of 0-10 feels he is at about an 8 now in his life ,fairly happy with self. Nurse will monitor blood pressure weekly.

## 2020-08-10 ENCOUNTER — Other Ambulatory Visit: Payer: Self-pay

## 2020-08-10 NOTE — Patient Instructions (Signed)
Visit Information  Mr. Edward Farmer  - as a part of your Medicaid benefit, you are eligible for care management and care coordination services at no cost or copay. I was unable to reach you by phone today but would be happy to help you with your health related needs. Please feel free to call me @ [phone].   A member of the Managed Medicaid care management team will reach out to you again over the next 30 days.   Gus Puma, BSW, [place] [facility]  [place]  High Risk Managed Medicaid Team

## 2020-08-10 NOTE — Patient Outreach (Signed)
Care Coordination  08/10/2020  EDDI HYMES 1960-09-23 [id number]   Medicaid Managed Care   Unsuccessful Outreach Note  08/10/2020 Name: Edward Farmer MRN: [id number] DOB: 1961/04/18  Referred by: [name], MD Reason for referral : High Risk Managed Medicaid (HR MM Unsuccessful Telephone Outreach)   An unsuccessful telephone outreach was attempted today. The patient was referred to the case management team for assistance with care management and care coordination.   Follow Up Plan: The care management team will reach out to the patient again over the next 30 days.   Gus Puma, BSW, [facility]  [place]  High Risk Managed Medicaid Team

## 2020-08-23 NOTE — Congregational Nurse Program (Signed)
  Dept: [phone]   Congregational Nurse Program Note  Date of Encounter: 08/23/2020  Past Medical History: Past Medical History:  Diagnosis Date  . Asthma   . Bradycardia 07/14/2016   on BB. Rate improved with discontinuation.   . Coronary artery disease   . Hypertension   . Tobacco abuse     Encounter Details:  CNP Questionnaire - 08/23/20 1936      Questionnaire   Do you give verbal consent to treat you today? Yes    Visit Setting Other    Location Patient Served At [facility]    Patient Status Homeless    Medical Provider Yes    Insurance Medicaid    Intervention Counsel;Educate;Support    Housing/Utilities No permanent housing    Transportation Need transportation assistance    Referrals PCP - [place]         blood pressure follow up ,blood elevated ,states he has walked downtown today ,came back felt a little dizzy ,checked blood sugar level  99 mg ,@ 5;30 pm counseled ,did smoke cigar while out today ,counseled regarding cigar use and blood pressure. No chest pain,nopain any where he states.  counseled regarding other symptoms of heart attack ,to rest and if symptoms get worst use mobile [employer] or understands use of 911.  Follow weekly.

## 2020-08-24 ENCOUNTER — Ambulatory Visit: Payer: Medicaid Other | Admitting: Critical Care Medicine

## 2020-08-24 ENCOUNTER — Other Ambulatory Visit: Payer: Self-pay

## 2020-08-24 VITALS — BP 149/113 | HR 88 | Ht 70.0 in | Wt 220.0 lb

## 2020-08-24 DIAGNOSIS — I10 Essential (primary) hypertension: Secondary | ICD-10-CM

## 2020-08-24 DIAGNOSIS — E782 Mixed hyperlipidemia: Secondary | ICD-10-CM

## 2020-08-24 DIAGNOSIS — F17200 Nicotine dependence, unspecified, uncomplicated: Secondary | ICD-10-CM

## 2020-08-24 DIAGNOSIS — I25718 Atherosclerosis of autologous vein coronary artery bypass graft(s) with other forms of angina pectoris: Secondary | ICD-10-CM

## 2020-08-24 DIAGNOSIS — Z6832 Body mass index (BMI) 32.0-32.9, adult: Secondary | ICD-10-CM

## 2020-08-24 DIAGNOSIS — L309 Dermatitis, unspecified: Secondary | ICD-10-CM

## 2020-08-24 MED ORDER — CARVEDILOL 25 MG PO TABS: 25.0000 mg | ORAL_TABLET | Freq: Two times a day (BID) | ORAL | 3 refills | Status: DC

## 2020-08-24 MED ORDER — ATORVASTATIN CALCIUM 80 MG PO TABS: 80.0000 mg | ORAL_TABLET | Freq: Every day | ORAL | 3 refills | Status: DC

## 2020-08-24 MED ORDER — ASPIRIN EC 81 MG PO TBEC: 81.0000 mg | DELAYED_RELEASE_TABLET | Freq: Every day | ORAL | 3 refills | Status: DC

## 2020-08-24 MED ORDER — AMLODIPINE BESYLATE 10 MG PO TABS: 10.0000 mg | ORAL_TABLET | Freq: Every day | ORAL | 3 refills | Status: DC

## 2020-08-24 MED ORDER — TRIAMCINOLONE ACETONIDE 0.1 % EX CREA: 1.0000 "application " | TOPICAL_CREAM | Freq: Two times a day (BID) | CUTANEOUS | 3 refills | Status: DC

## 2020-08-24 MED ORDER — VALSARTAN-HYDROCHLOROTHIAZIDE 320-25 MG PO TABS: 1.0000 | ORAL_TABLET | Freq: Every day | ORAL | 3 refills | Status: DC

## 2020-08-24 NOTE — Assessment & Plan Note (Signed)
Elevated BMI patient given a DASH diet to follow

## 2020-08-24 NOTE — Assessment & Plan Note (Signed)
Dermatitis of the anterior upper chest  Prescribed triamcinolone cream

## 2020-08-24 NOTE — Assessment & Plan Note (Addendum)
History unstable angina now quiesced sent but needs better blood pressure control  Continue aspirin 81 mg daily  See hypertension assessment

## 2020-08-24 NOTE — Assessment & Plan Note (Signed)
  .   Current smoking consumption amount: 2 cigarettes a week  . Dicsussion on advise to quit smoking and smoking impacts: Cardiovascular impacts  . Patient's willingness to quit: Willing to quit  . Methods to quit smoking discussed: Behavioral modification  . Medication management of smoking session drugs discussed: None  . Resources provided:  AVS   . Setting quit date not established  . Follow-up arranged 1 month  Time spent counseling the patient: 5 minutes

## 2020-08-24 NOTE — Patient Instructions (Addendum)
[facility] to you here at [facility]  Your losartan and hydrochlorothiazide will be changed to valsartan HCT 1 daily  Please remember to take the Coreg twice daily for your blood pressure  A new cream for the rash on your neck will be issued  Please have the nurse at the shelter check your blood pressure again next week  An appointment with Dr. Delford Field will be made in the next month  Follow a healthy low-salt diet as outlined below  Smoking Tobacco Information, Adult Smoking tobacco can be harmful to your health. Tobacco contains a poisonous (toxic), colorless chemical called nicotine. Nicotine is addictive. It changes the brain and can make it hard to stop smoking. Tobacco also has other toxic chemicals that can hurt your body and raise your risk of many cancers. How can smoking tobacco affect me? Smoking tobacco puts you at risk for:  Cancer. Smoking is most commonly associated with lung cancer, but can also lead to cancer in other parts of the body.  Chronic obstructive pulmonary disease (COPD). This is a long-term lung condition that makes it hard to breathe. It also gets worse over time.  High blood pressure (hypertension), heart disease, stroke, or heart attack.  Lung infections, such as pneumonia.  Cataracts. This is when the lenses in the eyes become clouded.  Digestive problems. This may include peptic ulcers, heartburn, and gastroesophageal reflux disease (GERD).  Oral health problems, such as gum disease and tooth loss.  Loss of taste and smell. Smoking can affect your appearance by causing:  Wrinkles.  Yellow or stained teeth, fingers, and fingernails. Smoking tobacco can also affect your social life, because:  It may be challenging to find places to smoke when away from home. Many workplaces, [employer], hotels, and public places are tobacco-free.  Smoking is expensive. This is due to  the cost of tobacco and the long-term costs of treating health problems from smoking.  Secondhand smoke may affect those around you. Secondhand smoke can cause lung cancer, breathing problems, and heart disease. Children of smokers have a higher risk for: ? Sudden infant death syndrome (SIDS). ? Ear infections. ? Lung infections. If you currently smoke tobacco, quitting now can help you:  Lead a longer and healthier life.  Look, smell, breathe, and feel better over time.  Save money.  Protect others from the harms of secondhand smoke. What actions can I take to prevent health problems? Quit smoking  Do not start smoking. Quit if you already do.  Make a plan to quit smoking and commit to it. Look for programs to help you and ask your health care provider for recommendations and ideas.  Set a date and write down all the reasons you want to quit.  Let your friends and family know you are quitting so they can help and support you. Consider finding friends who also want to quit. It can be easier to quit with someone else, so that you can support each other.  Talk with your health care provider about using nicotine replacement medicines to help you quit, such as gum, lozenges, patches, sprays, or pills.  Do not replace cigarette smoking with electronic cigarettes, which are commonly called e-cigarettes. The safety of e-cigarettes is not known, and some may contain harmful chemicals.  If you try to quit but return to smoking, stay positive. It is common to slip up when you first quit, so take it one day  at a time.  Be prepared for cravings. When you feel the urge to smoke, chew gum or suck on hard candy.   Lifestyle  Stay busy and take care of your body.  Drink enough fluid to keep your urine pale yellow.  Get plenty of exercise and eat a healthy diet. This can help prevent weight gain after quitting.  Monitor your eating habits. Quitting smoking can cause you to have a larger  appetite than when you smoke.  Find ways to relax. Go out with friends or family to a movie or a restaurant where people do not smoke.  Ask your health care provider about having regular tests (screenings) to check for cancer. This may include blood tests, imaging tests, and other tests.  Find ways to manage your stress, such as meditation, yoga, or exercise. Where to find support To get support to quit smoking, consider:  Asking your health care provider for more information and resources.  Taking classes to learn more about quitting smoking.  Looking for local organizations that offer resources about quitting smoking.  Joining a support group for people who want to quit smoking in your local community.  Calling the smokefree.gov counselor helpline: 1-800-Quit-Now (930)524-5512(1-478-603-6626) Where to find more information You may find more information about quitting smoking from:  HelpGuide.org: www.helpguide.org  BankRights.uySmokefree.gov: smokefree.gov  American Lung Association: www.lung.org Contact a health care provider if you:  Have problems breathing.  Notice that your lips, nose, or fingers turn blue.  Have chest pain.  Are coughing up blood.  Feel faint or you pass out.  Have other health changes that cause you to worry. Summary  Smoking tobacco can negatively affect your health, the health of those around you, your finances, and your social life.  Do not start smoking. Quit if you already do. If you need help quitting, ask your health care provider.  Think about joining a support group for people who want to quit smoking in your local community. There are many effective programs that will help you to quit this behavior. This information is not intended to replace advice given to you by your health care provider. Make sure you discuss any questions you have with your health care provider. Document Revised: 01/15/2019 Document Reviewed: 05/07/2016 Elsevier Patient Education  2021  Elsevier Inc.  https://www.mata.com/https://www.nhlbi.nih.gov/files/docs/public/heart/dash_brief.pdf">  DASH Eating Plan DASH stands for Dietary Approaches to Stop Hypertension. The DASH eating plan is a healthy eating plan that has been shown to:  Reduce high blood pressure (hypertension).  Reduce your risk for type 2 diabetes, heart disease, and stroke.  Help with weight loss. What are tips for following this plan? Reading food labels  Check food labels for the amount of salt (sodium) per serving. Choose foods with less than 5 percent of the Daily Value of sodium. Generally, foods with less than 300 milligrams (mg) of sodium per serving fit into this eating plan.  To find whole grains, look for the word "whole" as the first word in the ingredient list. Shopping  Buy products labeled as "low-sodium" or "no salt added."  Buy fresh foods. Avoid canned foods and pre-made or frozen meals. Cooking  Avoid adding salt when cooking. Use salt-free seasonings or herbs instead of table salt or sea salt. Check with your health care provider or pharmacist before using salt substitutes.  Do not fry foods. Cook foods using healthy methods such as baking, boiling, grilling, roasting, and broiling instead.  Cook with heart-healthy oils, such as olive, canola, avocado, soybean,  or sunflower oil. Meal planning  Eat a balanced diet that includes: ? 4 or more servings of fruits and 4 or more servings of vegetables each day. Try to fill one-half of your plate with fruits and vegetables. ? 6-8 servings of whole grains each day. ? Less than 6 oz (170 g) of lean meat, poultry, or fish each day. A 3-oz (85-g) serving of meat is about the same size as a deck of cards. One egg equals 1 oz (28 g). ? 2-3 servings of low-fat dairy each day. One serving is 1 cup (237 mL). ? 1 serving of nuts, seeds, or beans 5 times each week. ? 2-3 servings of heart-healthy fats. Healthy fats called omega-3 fatty acids are found in foods such as  walnuts, flaxseeds, fortified milks, and eggs. These fats are also found in cold-water fish, such as sardines, salmon, and mackerel.  Limit how much you eat of: ? Canned or prepackaged foods. ? Food that is high in trans fat, such as some fried foods. ? Food that is high in saturated fat, such as fatty meat. ? Desserts and other sweets, sugary drinks, and other foods with added sugar. ? Full-fat dairy products.  Do not salt foods before eating.  Do not eat more than 4 egg yolks a week.  Try to eat at least 2 vegetarian meals a week.  Eat more home-cooked food and less restaurant, buffet, and fast food.   Lifestyle  When eating at a restaurant, ask that your food be prepared with less salt or no salt, if possible.  If you drink alcohol: ? Limit how much you use to:  0-1 drink a day for women who are not pregnant.  0-2 drinks a day for men. ? Be aware of how much alcohol is in your drink. In the [place] one drink equals one 12 oz bottle of beer (355 mL), one 5 oz glass of wine (148 mL), or one 1 oz glass of hard liquor (44 mL). General information  Avoid eating more than 2,300 mg of salt a day. If you have hypertension, you may need to reduce your sodium intake to 1,500 mg a day.  Work with your health care provider to maintain a healthy body weight or to lose weight. Ask what an ideal weight is for you.  Get at least 30 minutes of exercise that causes your heart to beat faster (aerobic exercise) most days of the week. Activities may include walking, swimming, or biking.  Work with your health care provider or dietitian to adjust your eating plan to your individual calorie needs. What foods should I eat? Fruits All fresh, dried, or frozen fruit. Canned fruit in natural juice (without added sugar). Vegetables Fresh or frozen vegetables (raw, steamed, roasted, or grilled). Low-sodium or reduced-sodium tomato and vegetable juice. Low-sodium or reduced-sodium tomato sauce and tomato  paste. Low-sodium or reduced-sodium canned vegetables. Grains Whole-grain or whole-wheat bread. Whole-grain or whole-wheat pasta. Brown rice. Orpah Cobb. Bulgur. Whole-grain and low-sodium cereals. Pita bread. Low-fat, low-sodium crackers. Whole-wheat flour tortillas. Meats and other proteins Skinless chicken or [place]. Ground chicken or [place]. Pork with fat trimmed off. Fish and seafood. Egg whites. Dried beans, peas, or lentils. Unsalted nuts, nut butters, and seeds. Unsalted canned beans. Lean cuts of beef with fat trimmed off. Low-sodium, lean precooked or cured meat, such as sausages or meat loaves. Dairy Low-fat (1%) or fat-free (skim) milk. Reduced-fat, low-fat, or fat-free cheeses. Nonfat, low-sodium ricotta or cottage cheese. Low-fat or nonfat yogurt.  Low-fat, low-sodium cheese. Fats and oils Soft margarine without trans fats. Vegetable oil. Reduced-fat, low-fat, or light mayonnaise and salad dressings (reduced-sodium). Canola, safflower, olive, avocado, soybean, and sunflower oils. Avocado. Seasonings and condiments Herbs. Spices. Seasoning mixes without salt. Other foods Unsalted popcorn and pretzels. Fat-free sweets. The items listed above may not be a complete list of foods and beverages you can eat. Contact a dietitian for more information. What foods should I avoid? Fruits Canned fruit in a light or heavy syrup. Fried fruit. Fruit in cream or butter sauce. Vegetables Creamed or fried vegetables. Vegetables in a cheese sauce. Regular canned vegetables (not low-sodium or reduced-sodium). Regular canned tomato sauce and paste (not low-sodium or reduced-sodium). Regular tomato and vegetable juice (not low-sodium or reduced-sodium). Rosita Fire. Olives. Grains Baked goods made with fat, such as croissants, muffins, or some breads. Dry pasta or rice meal packs. Meats and other proteins Fatty cuts of meat. Ribs. Fried meat. Tomasa Blase. Bologna, salami, and other precooked or cured meats,  such as sausages or meat loaves. Fat from the back of a pig (fatback). Bratwurst. Salted nuts and seeds. Canned beans with added salt. Canned or smoked fish. Whole eggs or egg yolks. Chicken or [place] with skin. Dairy Whole or 2% milk, cream, and half-and-half. Whole or full-fat cream cheese. Whole-fat or sweetened yogurt. Full-fat cheese. Nondairy creamers. Whipped toppings. Processed cheese and cheese spreads. Fats and oils Butter. Stick margarine. Lard. Shortening. Ghee. Bacon fat. Tropical oils, such as coconut, palm kernel, or palm oil. Seasonings and condiments Onion salt, garlic salt, seasoned salt, table salt, and sea salt. Worcestershire sauce. Tartar sauce. Barbecue sauce. Teriyaki sauce. Soy sauce, including reduced-sodium. Steak sauce. Canned and packaged gravies. Fish sauce. Oyster sauce. Cocktail sauce. Store-bought horseradish. Ketchup. Mustard. Meat flavorings and tenderizers. Bouillon cubes. Hot sauces. Pre-made or packaged marinades. Pre-made or packaged taco seasonings. Relishes. Regular salad dressings. Other foods Salted popcorn and pretzels. The items listed above may not be a complete list of foods and beverages you should avoid. Contact a dietitian for more information. Where to find more information  National Heart, Lung, and Blood Institute: PopSteam.is  American Heart Association: www.heart.org  Academy of Nutrition and Dietetics: www.eatright.org  National Kidney Foundation: www.kidney.org Summary  The DASH eating plan is a healthy eating plan that has been shown to reduce high blood pressure (hypertension). It may also reduce your risk for type 2 diabetes, heart disease, and stroke.  When on the DASH eating plan, aim to eat more fresh fruits and vegetables, whole grains, lean proteins, low-fat dairy, and heart-healthy fats.  With the DASH eating plan, you should limit salt (sodium) intake to 2,300 mg a day. If you have hypertension, you may need to reduce  your sodium intake to 1,500 mg a day.  Work with your health care provider or dietitian to adjust your eating plan to your individual calorie needs. This information is not intended to replace advice given to you by your health care provider. Make sure you discuss any questions you have with your health care provider. Document Revised: 03/26/2019 Document Reviewed: 03/26/2019 Elsevier Patient Education  2021 [employer].

## 2020-08-24 NOTE — Assessment & Plan Note (Signed)
Continue statin therapy.

## 2020-08-24 NOTE — Progress Notes (Signed)
Subjective:    Patient ID: Edward Farmer, male    DOB: 1960/05/15, 60 y.o.   MRN: [id number]  This is a 60 year old male primary care patient of mine who I first saw in the homeless shelter clinic early in 2021 he then [facility] but only saw him 1 time.  History of hypertension and this progressed and unfortunately ran out of medications came to the hospital April 18, 2020 and was admitted below is documentation from the final assessment of the hospitalization discharge summary  04/2021 [place] Summary: Echo: EF 50-55%, no regional wall motion abnormalities  Left heart cath  -Diffusely diseased nonobstructive RCA  -45% left main  -Ostial LAD 80%  -Patent LIMA LAD  -30% circumflex      Assessment & Plan:    Unstable Angina/hypertensive crisis:  -[name] was admitted with chest pain and elevated blood pressure.  Troponins were minimally elevated.  EKG was unchanged.  Due to prior history of left main disease and CABG she underwent coronary angiography.  He was found to have diffuse obstructive disease in a nondominant RCA.  His left main lesion was unchanged.  His cath was basically unchanged.  -Symptoms are likely related to angina from uncontrolled hypertension.  -Blood pressure was controlled with the addition of losartan.  He will continue his home amlodipine, Coreg and HCTZ at discharge as well.  -LDL not at goal.  Lipitor was increased.  He will continue on aspirin moving forward.  -Right femoral cath site was clean and dry without hematoma or bruit, good pulses    2.  Elevated LVEDP/hypertension  -Reports from the catheter LVEDP of 30 mmHg.  IVC was collapsible.  BMP was negative for heart failure.  -He did undergo IV diuresis x1.  -He was euvolemic on the day of discharge.  Overall, findings are consistent with heart failure.  I recommended to continue his home HCTZ.  He will follow-up with his primary cardiologist.  -Losartan  was added to his blood pressure regimen.    3.  Hyperlipidemia  -Lipitor increased to 80 mg daily.  Will need repeat fasting lipid.  LDL was not at goal.    4.  Tobacco abuse  -Smoking cessation was advised.   Since discharge the patient has been maintaining his medications but having difficult to access them at times he is now with manage Medicaid.  He just moved to the [employer] 3 weeks ago and is in a much better state here his smoking is down to about 2 cigarettes a week he is not drinking alcohol no longer using cocaine still complains of a rash in the anterior neck upper chest area he denies leg edema no chest pain no shortness of breath however he has had occasional chest pain in the distant past for which nitroglycerin helps  The patient had previous coronary artery bypass grafting in the past and that he has lima to his LAD.  On arrival to the unit he is 149/113 Numbers of been similar with the Congregational nurse who checks his blood pressure in the homeless shelter    Past Medical History:  Diagnosis Date  . Asthma   . Bradycardia 07/14/2016   on BB. Rate improved with discontinuation.   . Coronary artery disease   . Hypertension   . Tobacco abuse      Family History  Problem Relation Age of Onset  . Migraines Mother   . Heart attack Father  Social History   Socioeconomic History  . Marital status: Legally Separated    Spouse name: Not on file  . Number of children: Not on file  . Years of education: Not on file  . Highest education level: Not on file  Occupational History  . Not on file  Tobacco Use  . Smoking status: Current Every Day Smoker    Packs/day: 0.25    Types: Cigars  . Smokeless tobacco: Never Used  . Tobacco comment: cutting back  Vaping Use  . Vaping Use: Never used  Substance and Sexual Activity  . Alcohol use: Yes    Alcohol/week: 0.0 standard drinks    Comment: occ  . Drug use: No  . Sexual activity: Not on file  Other  Topics Concern  . Not on file  Social History Narrative  . Not on file   Social Determinants of Health   Financial Resource Strain: Not on file  Food Insecurity: Not on file  Transportation Needs: No Transportation Needs  . Lack of Transportation (Medical): No  . Lack of Transportation (Non-Medical): No  Physical Activity: Not on file  Stress: Not on file  Social Connections: Not on file  Intimate Partner Violence: Not on file     No Known Allergies   Outpatient Medications Prior to Visit  Medication Sig Dispense Refill  . nitroGLYCERIN (NITROSTAT) 0.4 MG SL tablet Place 1 tablet (0.4 mg total) under the tongue every 5 (five) minutes x 3 doses as needed for chest pain. 25 tablet 3  . amLODipine (NORVASC) 10 MG tablet Take 1 tablet (10 mg total) by mouth daily. 90 tablet 3  . aspirin EC 81 MG tablet Take 1 tablet (81 mg total) by mouth daily. Swallow whole. 90 tablet 3  . atorvastatin (LIPITOR) 80 MG tablet Take 1 tablet (80 mg total) by mouth daily at 6 PM. 90 tablet 3  . hydrochlorothiazide (HYDRODIURIL) 25 MG tablet Take 1 tablet (25 mg total) by mouth daily. 90 tablet 3  . losartan (COZAAR) 50 MG tablet Take 1 tablet (50 mg total) by mouth daily. 90 tablet 3  . carvedilol (COREG) 25 MG tablet Take 1 tablet (25 mg total) by mouth 2 (two) times daily with a meal. (Patient not taking: Reported on 08/24/2020) 180 tablet 3   No facility-administered medications prior to visit.      Review of Systems Constitutional:   No  weight loss, night sweats,  Fevers, chills, fatigue, lassitude. HEENT:   No headaches,  Difficulty swallowing,  Tooth/dental problems,  Sore throat,                No sneezing, itching, ear ache, nasal congestion, post nasal drip,   CV:   chest pain,  Orthopnea, PND, swelling in lower extremities, anasarca, dizziness, palpitations  GI  No heartburn, indigestion, abdominal pain, nausea, vomiting, diarrhea, change in bowel habits, loss of appetite  Resp: No  shortness of breath with exertion or at rest.  No excess mucus, no productive cough,  No non-productive cough,  No coughing up of blood.  No change in color of mucus.  No wheezing.  No chest wall deformity  Skin: no rash or lesions.  GU: no dysuria, change in color of urine, no urgency or frequency.  No flank pain.  MS:  No joint pain or swelling.  No decreased range of motion.  No back pain.  Psych:  No change in mood or affect. No depression or anxiety.  No memory loss.  Objective:   Physical Exam  Vitals:   08/24/20 1537  BP: (!) 149/113  Pulse: 88  SpO2: 94%  Weight: 220 lb (99.8 kg)  Height: 5' 10"  (1.778 m)    Gen: Pleasant, well-nourished, in no distress,  normal affect  ENT: No lesions,  mouth clear,  oropharynx clear, no postnasal drip  Neck: No JVD, no TMG, no carotid bruits  Lungs: No use of accessory muscles, no dullness to percussion, clear without rales or rhonchi  Cardiovascular: RRR, heart sounds normal, no murmur or gallops, no peripheral edema  Abdomen: soft and NT, no HSM,  BS normal  Musculoskeletal: No deformities, no cyanosis or clubbing  Neuro: alert, non focal  Skin: Warm, no lesions or rashes BMP Latest Ref Rng & Units 04/21/2020 04/20/2020 04/19/2020  Glucose 70 - 99 mg/dL 102(H) 98 115(H)  BUN 6 - 20 mg/dL 13 11 12   Creatinine 0.61 - 1.24 mg/dL 1.04 1.09 1.10  BUN/Creat Ratio 10 - 24 - - -  Sodium 135 - 145 mmol/L 136 135 133(L)  Potassium 3.5 - 5.1 mmol/L 4.0 4.2 4.2  Chloride 98 - 111 mmol/L 103 106 103  CO2 22 - 32 mmol/L 21(L) 20(L) 21(L)  Calcium 8.9 - 10.3 mg/dL 9.0 8.8(L) 8.7(L)   Hepatic Function Latest Ref Rng & Units 12/29/2019 03/12/2018 11/30/2017  Total Protein 6.0 - 8.5 g/dL 7.0 7.1 6.8  Albumin 3.8 - 4.9 g/dL 4.1 4.0 3.1(L)  AST 0 - 40 IU/L 23 14 23   ALT 0 - 44 IU/L 29 23 19   Alk Phosphatase 48 - 121 IU/L 94 91 87  Total Bilirubin 0.0 - 1.2 mg/dL 0.5 0.4 0.9  Bilirubin, Direct 0.00 - 0.40 mg/dL - 0.10 -   CBC Latest  Ref Rng & Units 04/20/2020 04/19/2020 04/18/2020  WBC 4.0 - 10.5 K/uL 8.3 8.8 10.6(H)  Hemoglobin 13.0 - 17.0 g/dL 14.1 14.4 14.8  Hematocrit 39.0 - 52.0 % 42.4 46.2 45.9  Platelets 150 - 400 K/uL 204 210 218   Wt Readings from Last 3 Encounters:  08/24/20 220 lb (99.8 kg)  04/21/20 215 lb (97.5 kg)  02/11/20 219 lb (99.3 kg)        Assessment & Plan:  I personally reviewed all images and lab data in the [facility] system as well as any outside material available during this office visit and agree with the  radiology impressions.   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 Current smoking consumption amount: 2 cigarettes a week  . Dicsussion on advise to quit smoking and smoking impacts: Cardiovascular impacts  . Patient's willingness to quit: Willing to quit  . Methods to quit smoking discussed: Behavioral modification  . Medication management of smoking session drugs discussed: None  . Resources provided:  AVS   . Setting quit date not established  . Follow-up arranged 1 month  Time spent counseling the patient: 5 minutes    Hyperlipidemia Continue statin therapy  Dermatitis Dermatitis of the anterior upper chest  Prescribed triamcinolone cream   Able was  seen today for hypertension.  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  Essential hypertension -     carvedilol (COREG) 25 MG tablet; Take 1 tablet (25 mg total) by mouth 2 (two) times daily with a meal.  BMI 32.0-32.9,adult  Tobacco dependence  Mixed hyperlipidemia  Dermatitis  Other orders -     amLODipine (NORVASC) 10 MG tablet; Take 1 tablet (10 mg total) by mouth daily. -     aspirin EC 81 MG tablet; Take 1 tablet (81 mg total) by mouth daily. Swallow whole. -     atorvastatin (LIPITOR) 80 MG tablet; Take 1 tablet (80 mg total) by mouth daily at 6 PM. -     valsartan-hydrochlorothiazide (DIOVAN-HCT) 320-25 MG tablet; Take 1 tablet by mouth daily. -     triamcinolone cream (KENALOG) 0.1 %; Apply 1 application topically 2 (two) times daily. To affected area  I spent 35 minutes with this patient complex medical decision making high risk of rehospitalization and death medication management education of the patient interview and exam reviewing all prior records since his last visit in August 2021

## 2020-08-24 NOTE — Assessment & Plan Note (Signed)
Plan to change to valsartan HCT 320/25 discontinue hydrocal thiazide discontinue losartan  Continue amlodipine 10 mg daily continue Coreg 25 mg twice daily  Reestablish in the office short-term follow-up

## 2020-08-30 ENCOUNTER — Telehealth: Payer: Self-pay

## 2020-08-30 NOTE — Congregational Nurse Program (Signed)
  Dept: [phone]   Congregational Nurse Program Note  Date of Encounter: 08/29/2020  Past Medical History: Past Medical History:  Diagnosis Date  . Asthma   . Bradycardia 07/14/2016   on BB. Rate improved with discontinuation.   . Coronary artery disease   . Hypertension   . Tobacco abuse     Encounter Details:  CNP Questionnaire - 08/30/20 1707      Questionnaire   Visit Setting Other    Location Patient Served At [facility]    Patient Status Homeless    Medical Provider Yes    Insurance Medicaid    Intervention Counsel;Educate;Support    Housing/Utilities No permanent housing    Transportation Need transportation assistance    Medication Have medication insecurities;Provided medication assistance (Pharmacies, drug rep, etc.)    Referrals PCP - [place];Medication Assistance         follow up on visit to mobile [place] last week ,states new medication ordered but not sure who is delivering them or what ? Nurse reviewed paper work medications ar [facility]  Located at [facility] ,nurse will call tomorrow and follow up . Client has not started new blood pressure medications ,counseled regarding importance! Blood pressure still elevated. Follow up om medications tomorrow.

## 2020-08-30 NOTE — Telephone Encounter (Signed)
Pharmacy states medication delivery was attempted several times and tried calling client . Nurse clarified address and phone number .  Notified client that medications would be delivered today ,will not have co pay until Friday ,callled pharmacy again medications will be delivered Friday ,client has $16.00 co pay. Client understands importance of getting medications and taking them . Will follow up next week

## 2020-08-31 DIAGNOSIS — Z20822 Contact with and (suspected) exposure to covid-19: Secondary | ICD-10-CM | POA: Diagnosis not present

## 2020-09-04 DIAGNOSIS — Z20822 Contact with and (suspected) exposure to covid-19: Secondary | ICD-10-CM | POA: Diagnosis not present

## 2020-09-05 NOTE — Congregational Nurse Program (Signed)
  Dept: [phone]   Congregational Nurse Program Note  Date of Encounter: 09/05/2020  Past Medical History: Past Medical History:  Diagnosis Date  . Asthma   . Bradycardia 07/14/2016   on BB. Rate improved with discontinuation.   . Coronary artery disease   . Hypertension   . Tobacco abuse     Encounter Details:  CNP Questionnaire - 09/05/20 1536      Questionnaire   Do you give verbal consent to treat you today? Yes    Visit Setting Other    Location Patient Served At [facility]    Patient Status Homeless    Medical Provider Yes    Insurance Medicaid    Intervention Counsel;Educate;Support    Housing/Utilities No permanent housing    Transportation Need transportation assistance    Medication Have medication insecurities    Referrals PCP - [place]         In for blood pressure check and medication box review. Reviewed medication list in Epic with client taking right medication ,had some duplicates .One medication not on medication list  Losartan Potassium 50 mgTA  Will check with PCP if he should continue to take it and let client know.. Will send note to PCP .,note sent  Follow tomorrow ,counseled regarding blood pressure down today !!! Took a walk downtown today encouraged to take breaks when walking long distance .

## 2020-09-06 NOTE — Congregational Nurse Program (Signed)
  Dept: [phone]   Congregational Nurse Program Note  Date of Encounter: 09/06/2020  Past Medical History: Past Medical History:  Diagnosis Date  . Asthma   . Bradycardia 07/14/2016   on BB. Rate improved with discontinuation.   . Coronary artery disease   . Hypertension   . Tobacco abuse     Encounter Details:  CNP Questionnaire - 09/06/20 1600      Questionnaire   Do you give verbal consent to treat you today? Yes    Visit Setting Other    Location Patient Served At [facility]    Patient Status Homeless    Medical Provider Yes    Insurance Medicaid    Intervention Counsel;Educate;Support    Housing/Utilities No permanent housing    Referrals PCP - [place]           Blood pressure check today .Doing ok  138/86 pulse 75 Blood pressure going down ,taking medications  Follow weekly

## 2020-09-07 ENCOUNTER — Other Ambulatory Visit: Payer: Self-pay

## 2020-09-11 DIAGNOSIS — Z20822 Contact with and (suspected) exposure to covid-19: Secondary | ICD-10-CM | POA: Diagnosis not present

## 2020-09-18 DIAGNOSIS — Z20822 Contact with and (suspected) exposure to covid-19: Secondary | ICD-10-CM | POA: Diagnosis not present

## 2020-09-19 NOTE — Congregational Nurse Program (Signed)
  Dept: [phone]   Congregational Nurse Program Note  Date of Encounter: 09/19/2020  Past Medical History: Past Medical History:  Diagnosis Date  . Asthma   . Bradycardia 07/14/2016   on BB. Rate improved with discontinuation.   . Coronary artery disease   . Hypertension   . Tobacco abuse     Encounter Details:  CNP Questionnaire - 09/19/20 1400      Questionnaire   Do you give verbal consent to treat you today? Yes    Visit Setting Other    Location Patient Served At [facility]    Patient Status Homeless    Medical Provider Yes    Insurance Medicaid    Intervention Counsel;Educate;Support    Housing/Utilities No permanent housing    Transportation Need transportation assistance    Referrals PCP - [place]          Client in for blood pressure check. Counseled blood pressure has gone way down since medication change 09/02/20. Taking his medications ,doing well walking some each day. .Stopped in last week when nurse was out and had [employer] dept to take blood pressure ,couldn't remember values but felt that was a good resource.  Follow weekly.

## 2020-09-25 DIAGNOSIS — Z20822 Contact with and (suspected) exposure to covid-19: Secondary | ICD-10-CM | POA: Diagnosis not present

## 2020-09-26 NOTE — Congregational Nurse Program (Signed)
  Dept: [phone]   Congregational Nurse Program Note  Date of Encounter: 09/26/2020  Past Medical History: Past Medical History:  Diagnosis Date  . Asthma   . Bradycardia 07/14/2016   on BB. Rate improved with discontinuation.   . Coronary artery disease   . Hypertension   . Tobacco abuse     Encounter Details:  CNP Questionnaire - 09/26/20 1743      Questionnaire   Do you give verbal consent to treat you today? Yes    Visit Setting Other    Location Patient Served At [facility]    Patient Status Homeless    Medical Provider Yes    Insurance Medicaid    Intervention Counsel;Educate;Support    Housing/Utilities No permanent housing    Referrals PCP - [place]         weekly blood pressure monitoring . Client states he is doing very well,taking his medications and has medications on hand. Counseled regarding his reading today and showed previous readings of blood pressure .Very pleased with progress and medications.  Continue to monitor

## 2020-10-03 DIAGNOSIS — Z20822 Contact with and (suspected) exposure to covid-19: Secondary | ICD-10-CM | POA: Diagnosis not present

## 2020-10-03 NOTE — Congregational Nurse Program (Signed)
  Dept: [phone]   Congregational Nurse Program Note  Date of Encounter: 10/03/2020  Past Medical History: Past Medical History:  Diagnosis Date  . Asthma   . Bradycardia 07/14/2016   on BB. Rate improved with discontinuation.   . Coronary artery disease   . Hypertension   . Tobacco abuse     Encounter Details:  CNP Questionnaire - 10/03/20 1400      Questionnaire   Do you give verbal consent to treat you today? Yes    Visit Setting Other    Location Patient Served At [facility]    Patient Status Homeless    Medical Provider Yes    Insurance Medicaid    Intervention Counsel;Educate;Support    Housing/Utilities No permanent housing    Referrals PCP - [place]         blood pressure monitoring , good control ,taking medications ,feels good ! Follow weekly

## 2020-10-10 NOTE — Congregational Nurse Program (Signed)
  Dept: [phone]   Congregational Nurse Program Note  Date of Encounter: 10/10/2020  Past Medical History: Past Medical History:  Diagnosis Date  . Asthma   . Bradycardia 07/14/2016   on BB. Rate improved with discontinuation.   . Coronary artery disease   . Hypertension   . Tobacco abuse     Encounter Details:  CNP Questionnaire - 10/10/20 1400      Questionnaire   Do you give verbal consent to treat you today? Yes    Visit Setting Other    Location Patient Served At [facility]    Patient Status Homeless    Medical Provider Yes    Insurance Medicaid    Intervention Counsel;Educate;Refer;Support    Housing/Utilities No permanent housing    Referrals PCP - [place];Other         blood pressure check  Doing well  Foot pain today needs toenails cut ,pain ,thick growth ,wanting referral to foot care ,will see PCP this week encouraged to mention referral for foot pain ,reflux. Counseled regarding podiatry care and acid reflux. Took Pepto Bismol some relief recommended Tums , will purchase but also plans to mention to PCP this week . 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 Taking medications ,blood pressure has stabilized .   Follow weekly

## 2020-10-12 ENCOUNTER — Ambulatory Visit: Payer: Medicaid Other | Admitting: Critical Care Medicine

## 2020-10-12 NOTE — Progress Notes (Deleted)
Subjective:    Patient ID: Edward Farmer, male    DOB: 06/08/1960, 60 y.o.   MRN: [id number]  This is a 60 year old male primary care patient of mine who I first saw in the homeless shelter clinic early in 2021 he then [facility] but only saw him 1 time.  History of hypertension and this progressed and unfortunately ran out of medications came to the hospital April 18, 2020 and was admitted below is documentation from the final assessment of the hospitalization discharge summary  04/2021 [place] Summary: Echo: EF 50-55%, no regional wall motion abnormalities  Left heart cath  -Diffusely diseased nonobstructive RCA  -45% left main  -Ostial LAD 80%  -Patent LIMA LAD  -30% circumflex        Assessment & Plan:     Unstable Angina/hypertensive crisis:  -Edward Farmer was admitted with chest pain and elevated blood pressure.  Troponins were minimally elevated.  EKG was unchanged.  Due to prior history of left main disease and CABG she underwent coronary angiography.  He was found to have diffuse obstructive disease in a nondominant RCA.  His left main lesion was unchanged.  His cath was basically unchanged.  -Symptoms are likely related to angina from uncontrolled hypertension.  -Blood pressure was controlled with the addition of losartan.  He will continue his home amlodipine, Coreg and HCTZ at discharge as well.  -LDL not at goal.  Lipitor was increased.  He will continue on aspirin moving forward.  -Right femoral cath site was clean and dry without hematoma or bruit, good pulses     2.  Elevated LVEDP/hypertension  -Reports from the catheter LVEDP of 30 mmHg.  IVC was collapsible.  BMP was negative for heart failure.  -He did undergo IV diuresis x1.  -He was euvolemic on the day of discharge.  Overall, findings are consistent with heart failure.  I recommended to continue his home HCTZ.  He will follow-up with his primary cardiologist.  -Losartan  was added to his blood pressure regimen.     3.  Hyperlipidemia  -Lipitor increased to 80 mg daily.  Will need repeat fasting lipid.  LDL was not at goal.     4.  Tobacco abuse  -Smoking cessation was advised.   Since discharge the patient has been maintaining his medications but having difficult to access them at times he is now with manage Medicaid.  He just moved to the [employer] 3 weeks ago and is in a much better state here his smoking is down to about 2 cigarettes a week he is not drinking alcohol no longer using cocaine still complains of a rash in the anterior neck upper chest area he denies leg edema no chest pain no shortness of breath however he has had occasional chest pain in the distant past for which nitroglycerin helps  The patient had previous coronary artery bypass grafting in the past and that he has lima to his LAD.  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 ([place]) History unstable angina now quiesced sent but needs better blood pressure control  Continue aspirin 81 mg daily  See hypertension assessment  Essential hypertension Plan to change to  valsartan HCT 320/25 discontinue hydrocal thiazide discontinue losartan  Continue amlodipine 10 mg daily continue Coreg 25 mg twice daily  Reestablish in the office short-term follow-up  Tobacco dependence     Current smoking consumption amount: 2 cigarettes a week   Dicsussion on advise to quit smoking and smoking impacts: Cardiovascular impacts   Patient's willingness to quit: Willing to quit   Methods to quit smoking discussed: Behavioral modification   Medication management of smoking session drugs discussed: None   Resources provided:   AVS    Setting quit date not established   Follow-up arranged 1 month  Time spent counseling the patient: 5 minutes    Hyperlipidemia Continue statin therapy  Dermatitis Dermatitis of the anterior upper chest  Prescribed triamcinolone cream   Anav was seen today for hypertension.  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  Essential hypertension -     carvedilol (COREG) 25 MG tablet; Take 1 tablet (25 mg total) by mouth 2 (two) times daily with a meal.  BMI 32.0-32.9,adult  Tobacco dependence  Mixed hyperlipidemia  Dermatitis  Other orders -     amLODipine (NORVASC) 10 MG tablet; Take 1 tablet (10 mg total) by mouth daily. -     aspirin EC 81 MG tablet; Take 1 tablet (81 mg total) by mouth daily. Swallow whole. -     atorvastatin (LIPITOR) 80 MG tablet; Take 1 tablet (80 mg total) by mouth daily at 6 PM. -     valsartan-hydrochlorothiazide (DIOVAN-HCT) 320-25 MG tablet; Take 1 tablet by mouth daily. -     triamcinolone cream (KENALOG) 0.1 %; Apply 1 application topically 2 (two) times daily. To affected area  I spent 35 minutes with this patient complex medical decision making high risk of rehospitalization and death medication management education of the patient interview and exam reviewing all prior records since his last visit in August 2021    Past Medical History:  Diagnosis Date   Asthma    Bradycardia 07/14/2016   on BB. Rate improved with discontinuation.    Coronary artery disease    Hypertension    Tobacco abuse      Family History  Problem Relation Age of Onset   Migraines Mother    Heart attack Father      Social History   Socioeconomic History   Marital status: Legally Separated    Spouse name: Not on file   Number of children: Not on  file   Years of education: Not on file   Highest education level: Not on file  Occupational History   Not on file  Tobacco Use   Smoking status: Every Day    Packs/day: 0.25    Pack years: 0.00    Types: Cigars, Cigarettes   Smokeless tobacco: Never   Tobacco comments:    cutting back  Vaping Use   Vaping Use: Never used  Substance and Sexual Activity   Alcohol use: Yes    Alcohol/week: 0.0 standard drinks    Comment: occ   Drug use: No   Sexual activity: Not on file  Other Topics Concern   Not on file  Social History Narrative   Not on file   Social Determinants of Health   Financial Resource Strain: Not on  file  Food Insecurity: Not on file  Transportation Needs: No Transportation Needs   Lack of Transportation (Medical): No   Lack of Transportation (Non-Medical): No  Physical Activity: Not on file  Stress: Not on file  Social Connections: Not on file  Intimate Partner Violence: Not on file     No Known Allergies   Outpatient Medications Prior to Visit  Medication Sig Dispense Refill   amLODipine (NORVASC) 10 MG tablet Take 1 tablet (10 mg total) by mouth daily. 90 tablet 3   aspirin EC 81 MG tablet Take 1 tablet (81 mg total) by mouth daily. Swallow whole. 90 tablet 3   atorvastatin (LIPITOR) 80 MG tablet Take 1 tablet (80 mg total) by mouth daily at 6 PM. 90 tablet 3   carvedilol (COREG) 25 MG tablet Take 1 tablet (25 mg total) by mouth 2 (two) times daily with a meal. 180 tablet 3   nitroGLYCERIN (NITROSTAT) 0.4 MG SL tablet Place 1 tablet (0.4 mg total) under the tongue every 5 (five) minutes x 3 doses as needed for chest pain. 25 tablet 3   triamcinolone cream (KENALOG) 0.1 % Apply 1 application topically 2 (two) times daily. To affected area 45 g 3   valsartan-hydrochlorothiazide (DIOVAN-HCT) 320-25 MG tablet Take 1 tablet by mouth daily. 90 tablet 3   No facility-administered medications prior to visit.      Review of Systems Constitutional:   No   weight loss, night sweats,  Fevers, chills, fatigue, lassitude. HEENT:   No headaches,  Difficulty swallowing,  Tooth/dental problems,  Sore throat,                No sneezing, itching, ear ache, nasal congestion, post nasal drip,   CV:   chest pain,  Orthopnea, PND, swelling in lower extremities, anasarca, dizziness, palpitations  GI  No heartburn, indigestion, abdominal pain, nausea, vomiting, diarrhea, change in bowel habits, loss of appetite  Resp: No shortness of breath with exertion or at rest.  No excess mucus, no productive cough,  No non-productive cough,  No coughing up of blood.  No change in color of mucus.  No wheezing.  No chest wall deformity  Skin: no rash or lesions.  GU: no dysuria, change in color of urine, no urgency or frequency.  No flank pain.  MS:  No joint pain or swelling.  No decreased range of motion.  No back pain.  Psych:  No change in mood or affect. No depression or anxiety.  No memory loss.     Objective:   Physical Exam  There were no vitals filed for this visit.   Gen: Pleasant, well-nourished, in no distress,  normal affect  ENT: No lesions,  mouth clear,  oropharynx clear, no postnasal drip  Neck: No JVD, no TMG, no carotid bruits  Lungs: No use of accessory muscles, no dullness to percussion, clear without rales or rhonchi  Cardiovascular: RRR, heart sounds normal, no murmur or gallops, no peripheral edema  Abdomen: soft and NT, no HSM,  BS normal  Musculoskeletal: No deformities, no cyanosis or clubbing  Neuro: alert, non focal  Skin: Warm, no lesions or rashes BMP Latest Ref Rng & Units 04/21/2020 04/20/2020 04/19/2020  Glucose 70 - 99 mg/dL 102(H) 98 115(H)  BUN 6 - 20 mg/dL 13 11 12   Creatinine 0.61 - 1.24 mg/dL 1.04 1.09 1.10  BUN/Creat Ratio 10 - 24 - - -  Sodium 135 - 145 mmol/L 136 135 133(L)  Potassium 3.5 -  5.1 mmol/L 4.0 4.2 4.2  Chloride 98 - 111 mmol/L 103 106 103  CO2 22 - 32 mmol/L 21(L) 20(L) 21(L)  Calcium 8.9 -  10.3 mg/dL 9.0 8.8(L) 8.7(L)   Hepatic Function Latest Ref Rng & Units 12/29/2019 03/12/2018 11/30/2017  Total Protein 6.0 - 8.5 g/dL 7.0 7.1 6.8  Albumin 3.8 - 4.9 g/dL 4.1 4.0 3.1(L)  AST 0 - 40 IU/L 23 14 23   ALT 0 - 44 IU/L 29 23 19   Alk Phosphatase 48 - 121 IU/L 94 91 87  Total Bilirubin 0.0 - 1.2 mg/dL 0.5 0.4 0.9  Bilirubin, Direct 0.00 - 0.40 mg/dL - 0.10 -   CBC Latest Ref Rng & Units 04/20/2020 04/19/2020 04/18/2020  WBC 4.0 - 10.5 K/uL 8.3 8.8 10.6(H)  Hemoglobin 13.0 - 17.0 g/dL 14.1 14.4 14.8  Hematocrit 39.0 - 52.0 % 42.4 46.2 45.9  Platelets 150 - 400 K/uL 204 210 218   Wt Readings from Last 3 Encounters:  09/06/20 220 lb (99.8 kg)  08/24/20 220 lb (99.8 kg)  04/21/20 215 lb (97.5 kg)        Assessment & Plan:  I personally reviewed all images and lab data in the [facility] system as well as any outside material available during this office visit and agree with the  radiology impressions.   No problem-specific Assessment & Plan notes found for this encounter.   There are no diagnoses linked to this encounter. I spent 35 minutes with this patient complex medical decision making high risk of rehospitalization and death medication management education of the patient interview and exam reviewing all prior records since his last visit in August 2021

## 2020-10-16 DIAGNOSIS — Z20822 Contact with and (suspected) exposure to covid-19: Secondary | ICD-10-CM | POA: Diagnosis not present

## 2020-10-19 NOTE — Congregational Nurse Program (Signed)
  Dept: [phone]   Congregational Nurse Program Note  Date of Encounter: 10/19/2020  Past Medical History: Past Medical History:  Diagnosis Date   Asthma    Bradycardia 07/14/2016   on BB. Rate improved with discontinuation.    Coronary artery disease    Hypertension    Tobacco abuse     Encounter Details:  CNP Questionnaire - 10/17/20 0200       Questionnaire   Do you give verbal consent to treat you today? Yes    Visit Setting Other    Location Patient Served At [facility]    Patient Status Homeless    Medical Provider Yes    Insurance Medicaid    Intervention Counsel;Educate;Support    Housing/Utilities No permanent housing    Referrals PCP - [place]           Blood pressure follow up Missed PCP appointment last week states he didn't cancel had to much going on Today being placed in hotel due to environmental issues with [employer] . Nurse counseled regarding blood pressure and will maintain contact  by phone and visits to monitor blood pressure. Will need to reschedule PCP viist

## 2020-10-23 ENCOUNTER — Ambulatory Visit: Payer: Medicaid Other | Admitting: Critical Care Medicine

## 2020-11-20 DIAGNOSIS — Z20822 Contact with and (suspected) exposure to covid-19: Secondary | ICD-10-CM | POA: Diagnosis not present

## 2020-11-22 ENCOUNTER — Other Ambulatory Visit: Payer: Medicaid Other

## 2020-11-23 NOTE — Patient Outreach (Signed)
Patient did not answer phone but due for meds on 11/29/20  Amlodipine   10 mg  Atorvastatin   80 mg  Aspirin   81 mg  Valsartan/HCTZ  320-25 mg  Carvedilol   25 mg

## 2020-11-27 DIAGNOSIS — Z20822 Contact with and (suspected) exposure to covid-19: Secondary | ICD-10-CM | POA: Diagnosis not present

## 2020-12-07 DIAGNOSIS — Z20822 Contact with and (suspected) exposure to covid-19: Secondary | ICD-10-CM | POA: Diagnosis not present

## 2020-12-11 DIAGNOSIS — Z20822 Contact with and (suspected) exposure to covid-19: Secondary | ICD-10-CM | POA: Diagnosis not present

## 2020-12-13 ENCOUNTER — Other Ambulatory Visit: Payer: Self-pay | Admitting: Critical Care Medicine

## 2020-12-13 ENCOUNTER — Other Ambulatory Visit (HOSPITAL_COMMUNITY): Payer: Self-pay

## 2020-12-13 ENCOUNTER — Encounter: Payer: Self-pay | Admitting: Physician Assistant

## 2020-12-13 DIAGNOSIS — I10 Essential (primary) hypertension: Secondary | ICD-10-CM

## 2020-12-13 DIAGNOSIS — I25718 Atherosclerosis of autologous vein coronary artery bypass graft(s) with other forms of angina pectoris: Secondary | ICD-10-CM

## 2020-12-13 MED ORDER — CARVEDILOL 25 MG PO TABS
25.0000 mg | ORAL_TABLET | Freq: Two times a day (BID) | ORAL | 3 refills | Status: DC
  Filled 2020-12-13 – 2020-12-21 (×2): qty 120, 60d supply, fill #0

## 2020-12-13 MED ORDER — AMLODIPINE BESYLATE 10 MG PO TABS
10.0000 mg | ORAL_TABLET | Freq: Every day | ORAL | 3 refills | Status: DC
  Filled 2020-12-13: qty 60, 60d supply, fill #0

## 2020-12-13 MED ORDER — ISOSORBIDE MONONITRATE ER 30 MG PO TB24
30.0000 mg | ORAL_TABLET | Freq: Every day | ORAL | 1 refills | Status: DC
  Filled 2020-12-13: qty 30, 30d supply, fill #0
  Filled 2021-01-11: qty 30, 30d supply, fill #1

## 2020-12-13 MED ORDER — TRIAMCINOLONE ACETONIDE 0.1 % EX CREA
1.0000 "application " | TOPICAL_CREAM | Freq: Two times a day (BID) | CUTANEOUS | 0 refills | Status: DC
  Filled 2020-12-13: qty 30, 15d supply, fill #0

## 2020-12-13 MED ORDER — ATORVASTATIN CALCIUM 80 MG PO TABS
80.0000 mg | ORAL_TABLET | Freq: Every day | ORAL | 1 refills | Status: DC
  Filled 2020-12-13 – 2020-12-27 (×3): qty 60, 60d supply, fill #0

## 2020-12-13 MED ORDER — VALSARTAN-HYDROCHLOROTHIAZIDE 320-25 MG PO TABS
1.0000 | ORAL_TABLET | Freq: Every day | ORAL | 3 refills | Status: DC
Start: 2020-12-13 — End: 2021-01-30
  Filled 2020-12-13: qty 60, 60d supply, fill #0

## 2020-12-13 MED ORDER — NITROGLYCERIN 0.4 MG SL SUBL
0.4000 mg | SUBLINGUAL_TABLET | SUBLINGUAL | 3 refills | Status: AC | PRN
  Filled 2020-12-13: qty 25, 20d supply, fill #0

## 2020-12-13 NOTE — Progress Notes (Signed)
Pt has been out of all of his BP meds for several weeks.   He is not currently taking anything.   He has Medicaid, but cannot afford the copays, even though only $3.   He will take the meds if he can get them, he felt better on the rx.  We will renew his medications, including ASA 81 mg, Nitro, Diovan/HCTZ 320/25 mg qd, amlodipine 10 mg qd  BP 164/96, HR 82, 100% O 2 sats  He occasionally gets chest pain. He gets is when he walks up steps or exerts himself. Sometimes the pain is severe.  He does not have nitro to take >> we will send rx in for that and add Imdur.   Miss Myriam Jacobson will deliver the meds tomorrow. A concern is that he should be using the mail-order pharmacy, but the rx will take 3 weeks to get here.  PCP:  [name], MD  PCP Cardiology:  [name], MD    [name], PA-C 12/13/2020 2:57 PM

## 2020-12-14 ENCOUNTER — Other Ambulatory Visit (HOSPITAL_COMMUNITY): Payer: Self-pay

## 2020-12-15 ENCOUNTER — Other Ambulatory Visit: Payer: Self-pay

## 2020-12-15 ENCOUNTER — Emergency Department (HOSPITAL_COMMUNITY)
Admission: EM | Admit: 2020-12-15 | Discharge: 2020-12-15 | Disposition: A | Discharge status: Home / Self Care | Payer: Medicaid Other | Attending: Emergency Medicine | Admitting: Emergency Medicine

## 2020-12-15 ENCOUNTER — Encounter (HOSPITAL_COMMUNITY): Payer: Self-pay | Admitting: Emergency Medicine

## 2020-12-15 ENCOUNTER — Emergency Department (HOSPITAL_COMMUNITY): Payer: Medicaid Other

## 2020-12-15 DIAGNOSIS — I119 Hypertensive heart disease without heart failure: Secondary | ICD-10-CM | POA: Insufficient documentation

## 2020-12-15 DIAGNOSIS — W19XXXA Unspecified fall, initial encounter: Secondary | ICD-10-CM | POA: Insufficient documentation

## 2020-12-15 DIAGNOSIS — S8001XA Contusion of right knee, initial encounter: Secondary | ICD-10-CM | POA: Diagnosis not present

## 2020-12-15 DIAGNOSIS — Z043 Encounter for examination and observation following other accident: Secondary | ICD-10-CM | POA: Diagnosis not present

## 2020-12-15 DIAGNOSIS — Z7982 Long term (current) use of aspirin: Secondary | ICD-10-CM | POA: Insufficient documentation

## 2020-12-15 DIAGNOSIS — S8991XA Unspecified injury of right lower leg, initial encounter: Secondary | ICD-10-CM | POA: Diagnosis present

## 2020-12-15 DIAGNOSIS — I10 Essential (primary) hypertension: Secondary | ICD-10-CM | POA: Diagnosis not present

## 2020-12-15 DIAGNOSIS — Z79899 Other long term (current) drug therapy: Secondary | ICD-10-CM | POA: Diagnosis not present

## 2020-12-15 DIAGNOSIS — F17210 Nicotine dependence, cigarettes, uncomplicated: Secondary | ICD-10-CM | POA: Insufficient documentation

## 2020-12-15 DIAGNOSIS — I2510 Atherosclerotic heart disease of native coronary artery without angina pectoris: Secondary | ICD-10-CM | POA: Insufficient documentation

## 2020-12-15 DIAGNOSIS — Y9359 Activity, other involving other sports and athletics played individually: Secondary | ICD-10-CM | POA: Insufficient documentation

## 2020-12-15 DIAGNOSIS — Z951 Presence of aortocoronary bypass graft: Secondary | ICD-10-CM | POA: Insufficient documentation

## 2020-12-15 DIAGNOSIS — J45909 Unspecified asthma, uncomplicated: Secondary | ICD-10-CM | POA: Insufficient documentation

## 2020-12-15 MED ORDER — HYDROCODONE-ACETAMINOPHEN 5-325 MG PO TABS
1.0000 | ORAL_TABLET | Freq: Once | ORAL | Status: AC
  Administered 2020-12-15: 1 via ORAL
  Filled 2020-12-15: qty 1

## 2020-12-15 MED ORDER — DICLOFENAC SODIUM 1 % EX GEL
2.0000 g | Freq: Four times a day (QID) | CUTANEOUS | 0 refills | Status: DC
  Filled 2020-12-15: qty 100, 12d supply, fill #0

## 2020-12-15 MED ORDER — IBUPROFEN 800 MG PO TABS
800.0000 mg | ORAL_TABLET | Freq: Once | ORAL | Status: AC
  Administered 2020-12-15: 800 mg via ORAL
  Filled 2020-12-15: qty 1

## 2020-12-15 NOTE — ED Notes (Signed)
RN applied knee brace, pt stated "that feels better already." Pt verbalized understanding of d/c instructions, meds and followup care. Denies questions. No distress noted. MD aware of pt BP, has chronic uncontrolled HTN, no orders for this. Instructed to follow up w PCP. Pt declined w/c to exit. Steady slow gait to exit.

## 2020-12-15 NOTE — ED Provider Notes (Signed)
[facility] EMERGENCY DEPARTMENT Provider Note   CSN: [id number] Arrival date & time: 12/15/20  0831     History No chief complaint on file.   Edward Farmer is a 60 y.o. male with a past medical history of hypertension presenting to the ED with a chief complaint of right knee pain.  States that he was skating 2 weeks ago when he fell onto his right knee.  Since then the pain has gradually improved up until yesterday when he was at work and he reinjured it after he hit it on some equipment.  He has been ambulatory since then but pain is worse with movement and ambulation.  He has taken NSAIDs with some improvement in his symptoms.  No prior fracture, dislocation or procedure in the area.  States that the Ace wrap is helping with his discomfort.  Denies any numbness or weakness, other injuries.  HPI     Past Medical History:  Diagnosis Date   Asthma    Bradycardia 07/14/2016   on BB. Rate improved with discontinuation.    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 OFF PUMP. LIMA TO LAD.;  Surgeon: Delight Ovens, MD;  Location: [facility] OR;  Service: Open Heart Surgery;  Laterality: N/A;   LEFT HEART CATH AND CORONARY ANGIOGRAPHY N/A 07/16/2016   Procedure: Left Heart Cath and Coronary Angiography;  Surgeon: Marykay Lex, MD;  Location: [facility] INVASIVE CV LAB;   Service: Cardiovascular;  Laterality: N/A;   LEFT HEART CATH AND CORONARY ANGIOGRAPHY N/A 04/19/2020   Procedure: LEFT HEART CATH AND CORONARY ANGIOGRAPHY;  Surgeon: Lennette Bihari, MD;  Location: [facility] INVASIVE CV LAB;  Service: Cardiovascular;  Laterality: N/A;   TEE WITHOUT CARDIOVERSION N/A 07/17/2016   Procedure: TRANSESOPHAGEAL ECHOCARDIOGRAM (TEE);  Surgeon: Delight Ovens, MD;  Location: [facility] OR;  Service: Open Heart Surgery;  Laterality: N/A;       Family History  Problem Relation Age of Onset   Migraines Mother    Heart attack Father     Social History   Tobacco Use   Smoking status: Every Day    Packs/day: 0.25    Types: Cigars, Cigarettes   Smokeless tobacco: Never   Tobacco comments:    cutting back  Vaping Use   Vaping Use: Never used  Substance Use Topics   Alcohol use: Yes    Alcohol/week: 0.0 standard drinks    Comment: occ   Drug use: No    Home Medications Prior to Admission medications   Medication Sig Start Date End Date Taking? Authorizing Provider  diclofenac Sodium (VOLTAREN) 1 % GEL Apply 2 g topically 4 (four) times daily. 12/15/20  Yes Nasri Boakye, PA-C  amLODipine (NORVASC) 10 MG tablet Take 1 tablet (10 mg total) by mouth daily. 12/13/20   [name], MD  aspirin EC 81 MG tablet  Take 1 tablet (81 mg total) by mouth daily. Swallow whole. 08/24/20   [name], MD  atorvastatin (LIPITOR) 80 MG tablet Take 1 tablet (80 mg total) by mouth daily at 6 PM. 12/13/20   [name], MD  carvedilol (COREG) 25 MG tablet Take 1 tablet (25 mg total) by mouth 2 (two) times daily with a meal. 12/13/20   [name], MD  isosorbide mononitrate (IMDUR) 30 MG 24 hr tablet Take 1 tablet (30 mg total) by mouth daily. 12/13/20   [name], MD  nitroGLYCERIN (NITROSTAT) 0.4 MG SL tablet Place 1 tablet (0.4 mg total) under the tongue every 5 (five) minutes x 3 doses as needed for chest pain. 12/13/20   [name], MD  triamcinolone  cream (KENALOG) 0.1 % Apply 1 application topically 2 (two) times daily. 12/13/20   [name], MD  valsartan-hydrochlorothiazide (DIOVAN-HCT) 320-25 MG tablet Take 1 tablet by mouth daily. 12/13/20   [name], MD    Allergies    Patient has no known allergies.  Review of Systems   Review of Systems  Constitutional:  Negative for chills and fever.  Musculoskeletal:  Positive for arthralgias.  Neurological:  Negative for weakness and numbness.   Physical Exam Updated Vital Signs BP (!) 162/112 (BP Location: Right Arm)   Pulse 80   Temp 98.4 F (36.9 C)   Resp 16   SpO2 95%   Physical Exam Vitals and nursing note reviewed.  Constitutional:      General: He is not in acute distress.    Appearance: He is well-developed. He is not diaphoretic.  HENT:     Head: Normocephalic and atraumatic.  Eyes:     General: No scleral icterus.    Conjunctiva/sclera: Conjunctivae normal.  Pulmonary:     Effort: Pulmonary effort is normal. No respiratory distress.  Musculoskeletal:        General: Tenderness present. No swelling.     Cervical back: Normal range of motion.     Comments: 2+ DP pulse palpated bilaterally.  Diffuse tenderness palpation of the right knee without overlying skin changes.  No deformities.  No edema or erythema, warmth of joint.  No calf tenderness bilaterally.  Pain with flexion actively of the knee but able to passively flex.  Able to extend without difficulty.  Ambulatory.  Skin:    Findings: No rash.  Neurological:     Mental Status: He is alert.    ED Results / Procedures / Treatments   Labs (all labs ordered are listed, but only abnormal results are displayed) Labs Reviewed - No data to display  EKG None  Radiology DG Knee Complete 4 Views Right  Result Date: 12/15/2020 CLINICAL DATA:  Fall EXAM: RIGHT KNEE - COMPLETE 4+ VIEW COMPARISON:  None. FINDINGS: There is no acute fracture or dislocation. Alignment is normal. The joint spaces are  preserved. There is mild superior patellar enthesopathy. There is no significant effusion. Vascular calcifications are noted. The soft tissues are otherwise unremarkable. IMPRESSION: No acute fracture or dislocation. Electronically Signed   By: Lesia Hausen MD   On: 12/15/2020 09:19    Procedures Procedures   Medications Ordered in ED Medications  HYDROcodone-acetaminophen (NORCO/VICODIN) 5-325 MG per tablet 1 tablet (has no administration in time range)  ibuprofen (ADVIL) tablet 800 mg (800 mg Oral Given 12/15/20 0911)    ED Course  I have reviewed the triage vital signs and the nursing notes.  Pertinent labs &  imaging results that were available during my care of the patient were reviewed by me and considered in my medical decision making (see chart for details).    MDM Rules/Calculators/A&P                           60 year old male presenting to the ED for right knee pain after hitting his knee yesterday.  Larey Seat on it while skating 2 weeks ago and pain had improved up until yesterday.  Denies any other injuries.  Pain is improved with compression.  On exam there is diffuse tenderness of the knee.  No overlying skin changes.  Reports pain with active flexion.  Area is neurovascularly intact.  Equal and intact distal pulses bilaterally.  X-rays negative for acute abnormality.  Suspect symptoms due to contusion.  Will treat with NSAIDs and compression.  Doubt infectious or vascular cause.  Return precautions given.    Patient is hemodynamically stable, in NAD, and able to ambulate in the ED. Evaluation does not show pathology that would require ongoing emergent intervention or inpatient treatment. I explained the diagnosis to the patient. Pain has been managed and has no complaints prior to discharge. Patient is comfortable with above plan and is stable for discharge at this time. All questions were answered prior to disposition. Strict return precautions for returning to the ED were  discussed. Encouraged follow up with PCP.   An After Visit Summary was printed and given to the patient.   Portions of this note were generated with Scientist, clinical (histocompatibility and immunogenetics). Dictation errors may occur despite best attempts at proofreading.  Final Clinical Impression(s) / ED Diagnoses Final diagnoses:  Contusion of right knee, initial encounter    Rx / DC Orders ED Discharge Orders          Ordered    diclofenac Sodium (VOLTAREN) 1 % GEL  4 times daily        12/15/20 0936             Dietrich Pates, PA-C 12/15/20 0936    Pollyann Savoy, MD 12/15/20 1009

## 2020-12-15 NOTE — ED Triage Notes (Signed)
Pt here with c/o right knee pain after falling on it 2 weeks ago , knee is swollen and hurts to walk on it

## 2020-12-15 NOTE — ED Notes (Signed)
Pt will be coming to hallway from xray

## 2020-12-15 NOTE — Discharge Instructions (Addendum)
Use of Voltaren gel as needed. Follow-up with your primary care provider. Return to the ER if you start to experience worsening pain, swelling, redness or warmth of your joint or additional injuries.

## 2020-12-18 ENCOUNTER — Telehealth: Payer: Self-pay

## 2020-12-18 NOTE — Telephone Encounter (Signed)
Transition Care Management Follow-up Telephone Call Date of discharge and from where: 12/15/2020-[place] How have you been since you were released from the hospital? Patient stated he is doing ok.  Any questions or concerns? No  Items Reviewed: Did the pt receive and understand the discharge instructions provided? Yes  Medications obtained and verified? Yes  Other? No  Any new allergies since your discharge? No  Dietary orders reviewed? N/A Do you have support at home? Yes   Home Care and Equipment/Supplies: Were home health services ordered? not applicable If so, what is the name of the agency? N/A  Has the agency set up a time to come to the patient's home? not applicable Were any new equipment or medical supplies ordered?  No What is the name of the medical supply agency? N/A Were you able to get the supplies/equipment? not applicable Do you have any questions related to the use of the equipment or supplies? No  Functional Questionnaire: (I = Independent and D = Dependent) ADLs: I  Bathing/Dressing- I  Meal Prep- I  Eating- I  Maintaining continence- I  Transferring/Ambulation- I  Managing Meds- I  Follow up appointments reviewed:  PCP Hospital f/u appt confirmed? No   Specialist Hospital f/u appt confirmed? No   Are transportation arrangements needed? No  If their condition worsens, is the pt aware to call PCP or go to the Emergency Dept.? Yes Was the patient provided with contact information for the PCP's office or ED? Yes Was to pt encouraged to call back with questions or concerns? Yes

## 2020-12-20 ENCOUNTER — Encounter: Payer: Self-pay | Admitting: Family Medicine

## 2020-12-20 NOTE — Progress Notes (Signed)
Seen at [facility] Mr Philson with hx CABG, HTN, tobacco dependence, obesity  Recent fall with right knee pain  Seen in ED and dx with knee contusion Given knee imobilizer, not sure if this is helping No locking, clicking, popping.  He's noticed swelling, but thinks it's improving.   Has been using APAP, ibuprofen, exedrin, aleve - seems like he's been taking Lynae Pederson lot (he's not able to quantify well).  He's noticed some stomach irritation since he's been taking so much.  No nausea/vomiting.  He's also noticed constipation. Denies CP, SOB - feeling well overall.  Meds reviewed  Vitals:   12/20/20 1933  BP: (!) 160/110  Pulse: 97  SpO2: 95%   NAD Unlabored RRR No LEE No apparent deficits, moving all extremities R knee with immobilizer over shin - knee wrapped with ace, not significantly TTP, not obviously swollen.  Able to actively range knee.    AP Hypertensive Urgency BP significantly elevated - he's on 5 BP meds amlodipine, coreg, imdur, valsartan-HCTZ.  Asx.  Looked at pill box, not clear if he's taking meds correctly (all medicines appeared to be there except imdur - aspirin/nitro were not there, but that was intentional) .   Myriam Jacobson, Congregational Nurse, going to review meds with him tomorrow, will make sure he has all medications.  Will follow blood pressure closely, but as he's already on 5 meds, will make sure he's taking properly   R Knee Pain  Suspected contusion, improving  I think ok to d/c knee immobilizer  Voltaren, lidocaine patch given   Abdominal Discomfort  Constipation Suspect related to NSAIDs Stop all NSAIDs Consider additional w/u, adding PPI if persistent Milk of mag, probiotics    Current Outpatient Medications:    amLODipine (NORVASC) 10 MG tablet, Take 1 tablet (10 mg total) by mouth daily., Disp: 60 tablet, Rfl: 3   aspirin EC 81 MG tablet, Take 1 tablet (81 mg total) by mouth daily. Swallow whole., Disp: 90 tablet, Rfl: 3   atorvastatin (LIPITOR)  80 MG tablet, Take 1 tablet (80 mg total) by mouth daily at 6 PM., Disp: 60 tablet, Rfl: 1   carvedilol (COREG) 25 MG tablet, Take 1 tablet (25 mg total) by mouth 2 (two) times daily with Mindel Friscia meal., Disp: 120 tablet, Rfl: 3   diclofenac Sodium (VOLTAREN) 1 % GEL, Apply 2 g topically 4 (four) times daily., Disp: 100 g, Rfl: 0   isosorbide mononitrate (IMDUR) 30 MG 24 hr tablet, Take 1 tablet (30 mg total) by mouth daily., Disp: 30 tablet, Rfl: 1   nitroGLYCERIN (NITROSTAT) 0.4 MG SL tablet, Place 1 tablet (0.4 mg total) under the tongue every 5 (five) minutes x 3 doses as needed for chest pain., Disp: 25 tablet, Rfl: 3   triamcinolone cream (KENALOG) 0.1 %, Apply 1 application topically 2 (two) times daily., Disp: 30 g, Rfl: 0   valsartan-hydrochlorothiazide (DIOVAN-HCT) 320-25 MG tablet, Take 1 tablet by mouth daily., Disp: 60 tablet, Rfl: 3

## 2020-12-21 ENCOUNTER — Other Ambulatory Visit (HOSPITAL_COMMUNITY): Payer: Self-pay

## 2020-12-22 ENCOUNTER — Other Ambulatory Visit: Payer: Self-pay

## 2020-12-25 ENCOUNTER — Telehealth (INDEPENDENT_AMBULATORY_CARE_PROVIDER_SITE_OTHER): Payer: Self-pay | Admitting: Critical Care Medicine

## 2020-12-25 NOTE — Telephone Encounter (Signed)
Appt was scheduled per PCP's request. Pt states he'd like to speak w/ Nurse Harriett Sine? Regarding medications and side effects. Pt states he's been feeling nauseous. (Pt was asked which one and pt said all of them*Pt did not take them today and pt feels fine. Pt would like to see PCP before if PCP deems necessary? Please advise and thank you.

## 2020-12-27 ENCOUNTER — Other Ambulatory Visit (HOSPITAL_COMMUNITY): Payer: Self-pay

## 2020-12-27 ENCOUNTER — Other Ambulatory Visit: Payer: Self-pay | Admitting: Critical Care Medicine

## 2020-12-27 ENCOUNTER — Encounter: Payer: Self-pay | Admitting: Physician Assistant

## 2020-12-27 DIAGNOSIS — I10 Essential (primary) hypertension: Secondary | ICD-10-CM

## 2020-12-27 DIAGNOSIS — I25718 Atherosclerosis of autologous vein coronary artery bypass graft(s) with other forms of angina pectoris: Secondary | ICD-10-CM

## 2020-12-27 MED ORDER — CARVEDILOL 25 MG PO TABS
25.0000 mg | ORAL_TABLET | Freq: Two times a day (BID) | ORAL | 3 refills | Status: DC
  Filled 2020-12-27: qty 120, 60d supply, fill #0
  Filled 2020-12-28: qty 68, 34d supply, fill #0

## 2020-12-27 MED ORDER — ATORVASTATIN CALCIUM 80 MG PO TABS
80.0000 mg | ORAL_TABLET | Freq: Every day | ORAL | 1 refills | Status: DC
  Filled 2020-12-27: qty 60, 60d supply, fill #0

## 2020-12-27 NOTE — Progress Notes (Addendum)
Pt seen by Dr Delford Field.  Pt c/o feeling bad. He got a pill organizer because his pills were very mixed up, in the wrong bottles.  Since using the organizer, he has felt bad, felt weak in the morning about 2 hr after he takes all the meds. He stopped everything.  He has not taken any meds in 3 days.   BP today 160/94.   He has not had to take nitro.   Upon review of his meds, he has ATV 80 tabs in am and pm meds. This med is not in the current med bottles.   Upon review, this is atorvastatin 80 mg qd, he may have been taking it twice a day.   This med will be held for now, restart on Sunday.   Continue Coreg 25 mg bid, amlodipine 10 mg qd, ASA 81 mg qd, Imdur 30 mg qd and valsartan/HCTZ 320-25 mg qd.  Upon review of the med box, there were 2 different brands of Coreg and the meds were not clear.  The pill boxes were redone. He was combining meds into 1 bottle when they got low, so there was more than 1 drug in the bottles.     He does not wake w/ LE edema. He can walk a block, but has to stop on the way back. He was normally is able to [facility] for 2 hours without stopping. He has not mowed in 3 weeks, and when he went to [place] today, he had to stop before he was finished.   CATH 04/19/2020 w/ LVEDP 30-32 Diagnostic Dominance: Left  ECHO 1. Left ventricular ejection fraction, by estimation, is 50 to 55%. The left ventricle has low normal function. The left ventricle has no regional wall motion abnormalities. There is moderate left ventricular hypertrophy. Left ventricular diastolic parameters are consistent with Grade I diastolic dysfunction (impaired  relaxation).   Once his BP is better controlled and meds are being taken consistently, if he still has the DOE, may need to consider an anginal source.   No clear CHF on exam.   Keep appt w/ Cards on Friday.  [name], PA-C 12/27/2020 4:34 PM

## 2020-12-28 ENCOUNTER — Other Ambulatory Visit (HOSPITAL_COMMUNITY): Payer: Self-pay

## 2020-12-29 ENCOUNTER — Ambulatory Visit (INDEPENDENT_AMBULATORY_CARE_PROVIDER_SITE_OTHER): Payer: Medicaid Other | Admitting: Medical

## 2020-12-29 ENCOUNTER — Encounter: Payer: Self-pay | Admitting: Medical

## 2020-12-29 ENCOUNTER — Other Ambulatory Visit: Payer: Self-pay

## 2020-12-29 VITALS — BP 143/86 | HR 82 | Ht 70.0 in | Wt 225.4 lb

## 2020-12-29 DIAGNOSIS — I10 Essential (primary) hypertension: Secondary | ICD-10-CM | POA: Diagnosis not present

## 2020-12-29 DIAGNOSIS — R0683 Snoring: Secondary | ICD-10-CM

## 2020-12-29 DIAGNOSIS — I25718 Atherosclerosis of autologous vein coronary artery bypass graft(s) with other forms of angina pectoris: Secondary | ICD-10-CM | POA: Diagnosis not present

## 2020-12-29 DIAGNOSIS — R072 Precordial pain: Secondary | ICD-10-CM

## 2020-12-29 DIAGNOSIS — Z951 Presence of aortocoronary bypass graft: Secondary | ICD-10-CM | POA: Diagnosis not present

## 2020-12-29 DIAGNOSIS — R0600 Dyspnea, unspecified: Secondary | ICD-10-CM

## 2020-12-29 DIAGNOSIS — I5032 Chronic diastolic (congestive) heart failure: Secondary | ICD-10-CM | POA: Diagnosis not present

## 2020-12-29 DIAGNOSIS — E782 Mixed hyperlipidemia: Secondary | ICD-10-CM

## 2020-12-29 DIAGNOSIS — F17200 Nicotine dependence, unspecified, uncomplicated: Secondary | ICD-10-CM

## 2020-12-29 DIAGNOSIS — R400 Somnolence: Secondary | ICD-10-CM

## 2020-12-29 NOTE — Patient Instructions (Signed)
Medication Instructions:  Your physician recommends that you continue on your current medications as directed. Please refer to the Current Medication list given to you today.  *If you need a refill on your cardiac medications before your next appointment, please call your pharmacy*   Lab Work: Fasting lipid and CMET next week If you have labs (blood work) drawn today and your tests are completely normal, you will receive your results only by: MyChart Message (if you have MyChart) OR A paper copy in the mail If you have any lab test that is abnormal or we need to change your treatment, we will call you to review the results.   Testing/Procedures: Your physician has requested that you have a lexiscan myoview. For further information please visit [email or web address] Please follow instruction sheet, as given.   Your physician has recommended that you have a sleep study. This test records several body functions during sleep, including: brain activity, eye movement, oxygen and carbon dioxide blood levels, heart rate and rhythm, breathing rate and rhythm, the flow of air through your mouth and nose, snoring, body muscle movements, and chest and belly movement.    Follow-Up: At [facility], you and your health needs are our priority.  As part of our continuing mission to provide you with exceptional heart care, we have created designated Provider Care Teams.  These Care Teams include your primary Cardiologist (physician) and Advanced Practice Providers (APPs -  Physician Assistants and Nurse Practitioners) who all work together to provide you with the care you need, when you need it.   Your next appointment:   4 month(s)  The format for your next appointment:   In Person  Provider:   [name], MD   Other Instructions Keep a log of your blood pressure readings and call us if your readings are consistently greater that 130/80.

## 2020-12-29 NOTE — Progress Notes (Signed)
Cardiology Office Note   Date:  12/29/2020   ID:  Edward Farmer, DOB 06/03/1960, MRN [id number]  PCP:  [name], MD  Cardiologist:  [name], MD EP: None  Chief Complaint  Patient presents with   Follow-up    CAD, DOE, chest pain       History of Present Illness: Edward Farmer is a 60 y.o. male with a PMH of CAD s/p CABG (LIMA to LAD), HTN, HLD, and tobacco abuse, who presents for follow-up of his CAD.  He was last evaluated by cardiology during an admission to the hospital 04/2020 after presenting with symptoms c/f unstable angina. HsTrop peaked at 54 and BP was elevated on admission. He underwent LHC 04/19/20 which showed severe native multivessel disease with 80% ostial LAD and 70% stenosis scattered throughout the RCA (2 proximal lesions and 1 mRCA), as well as 45% LM and 30% D1 and 1st LPL stenosis with patent LIMA to LAD. LVEDP was noted to be significantly elevated with moderate MR. Echo showed EF 50-55%, no RWMA, moderate LVH, G1DD, normal RV size/function, tribial MR, and borderline aortic root of 61mm with ascending aorta measuring 78mm. He was recommended to aggressive risk factor modifications including smoking cessation and BP/lipid control. He was started on losartan that admission, in addition to carvedilol, amlodipine, and HCTZ for improved BP control and atorvastatin was increased to 80mg  daily for improved cholesterol control. He was also ordered for an outpatient sleep study. He missed an outpatient appointment 04/2020 for post-hospital follow-up. More recently he has been followed in the [facility] clinic and reported feeling poorly at his visit 12/27/20. It was discovered that he was taking his medications inappropriately due to a mix up of his pills. Notably he was thought to be taking his atorvastatin BID and perhaps extra carvedilol. He noted increased DOE at that visit.   He presents today for follow-up. He reports increased DOE over the past  couple months. He has also been experiencing intermittent sharp left sided chest pain which occurs at rest and with activity, only lasting for seconds at a time. When asked about symptoms prior to his CABG, he felt his chest pain and DOE is similar to early symptoms in hindsight. He also has some orthopnea and PND but no LE edema. He describes symptoms very concerning for OSA including snoring, apneic episodes, PND, and daytime somnolence with very high Epworth score. He has no complaints of palpitations, dizziness, lightheadedness, or syncope. He reported checking his BP at the fire station yesterday and it was in the 120s/80s.   Past Medical History:  Diagnosis Date   Asthma    Bradycardia 07/14/2016   on BB. Rate improved with discontinuation.    Coronary artery disease    Hypertension    Tobacco abuse     Past Surgical History:  Procedure Laterality Date   CORONARY ARTERY BYPASS GRAFT N/A 07/17/2016   Procedure: CORONARY ARTERY BYPASS GRAFTING (CABG) x 1. OFF PUMP. LIMA TO LAD.;  Surgeon: 07/19/2016, MD;  Location: [facility] OR;  Service: Open Heart Surgery;  Laterality: N/A;   LEFT HEART CATH AND CORONARY ANGIOGRAPHY N/A 07/16/2016   Procedure: Left Heart Cath and Coronary Angiography;  Surgeon: 07/18/2016, MD;  Location: [facility] INVASIVE CV LAB;  Service: Cardiovascular;  Laterality: N/A;   LEFT HEART CATH AND CORONARY ANGIOGRAPHY N/A 04/19/2020   Procedure: LEFT HEART CATH AND CORONARY ANGIOGRAPHY;  Surgeon: 04/21/2020, MD;  Location: [facility] INVASIVE  CV LAB;  Service: Cardiovascular;  Laterality: N/A;   TEE WITHOUT CARDIOVERSION N/A 07/17/2016   Procedure: TRANSESOPHAGEAL ECHOCARDIOGRAM (TEE);  Surgeon: Delight OvensEdward B [name], MD;  [facility];  Service: Open Heart Surgery;  Laterality: N/A;     Current Outpatient Medications  Medication Sig Dispense Refill   amLODipine (NORVASC) 10 MG tablet Take 1 tablet (10 mg total) by mouth daily. 60 tablet 3   aspirin EC 81 MG tablet Take 1  tablet (81 mg total) by mouth daily. Swallow whole. 90 tablet 3   atorvastatin (LIPITOR) 80 MG tablet Take 1 tablet (80 mg total) by mouth daily at 6 PM. 60 tablet 1   carvedilol (COREG) 25 MG tablet Take 1 tablet (25 mg total) by mouth 2 (two) times daily with a meal. 120 tablet 3   diclofenac Sodium (VOLTAREN) 1 % GEL Apply 2 g topically 4 (four) times daily. 100 g 0   isosorbide mononitrate (IMDUR) 30 MG 24 hr tablet Take 1 tablet (30 mg total) by mouth daily. 30 tablet 1   nitroGLYCERIN (NITROSTAT) 0.4 MG SL tablet Place 1 tablet (0.4 mg total) under the tongue every 5 (five) minutes x 3 doses as needed for chest pain. 25 tablet 3   triamcinolone cream (KENALOG) 0.1 % Apply 1 application topically 2 (two) times daily. 30 g 0   valsartan-hydrochlorothiazide (DIOVAN-HCT) 320-25 MG tablet Take 1 tablet by mouth daily. 60 tablet 3   No current facility-administered medications for this visit.    Allergies:   Patient has no known allergies.    Social History:  The patient  reports that he has been smoking cigars and cigarettes. He has been smoking an average of .25 packs per day. He has never used smokeless tobacco. He reports current alcohol use. He reports that he does not use drugs.   Family History:  The patient's family history includes Heart attack in his father; Migraines in his mother.    ROS:  Please see the history of present illness.   Otherwise, review of systems are positive for none.   All other systems are reviewed and negative.    PHYSICAL EXAM: VS:  BP (!) 143/86 (BP Location: Right Arm, Patient Position: Sitting, Cuff Size: Normal)   Pulse 82   Ht 5\' 10"  (1.778 m)   Wt 225 lb 6.4 oz (102.2 kg)   SpO2 94%   BMI 32.34 kg/m  , BMI Body mass index is 32.34 kg/m. GEN: Well nourished, well developed, in no acute distress HEENT: normal Neck: no JVD, carotid bruits, or masses Cardiac: RRR; no murmurs, rubs, or gallops, no edema  Respiratory:  clear to auscultation  bilaterally, normal work of breathing GI: soft, obese, nontender, nondistended, + BS MS: no deformity or atrophy Skin: warm and dry, no rash Neuro:  Strength and sensation are intact Psych: euthymic mood, full affect   EKG:  EKG is ordered today. The ekg ordered today demonstrates sinus rhythm, rate 70 bpm, chronic anterolateral TWI, no STE/D, Qtc 462; no significant change from previous   Recent Labs: 04/19/2020: B Natriuretic Peptide 32.5 04/20/2020: Hemoglobin 14.1; Platelets 204 04/21/2020: BUN 13; Creatinine, Ser 1.04; Potassium 4.0; Sodium 136    Lipid Panel    Component Value Date/Time   CHOL 186 04/19/2020 1046   CHOL 117 12/29/2019 0935   TRIG 79 04/19/2020 1046   HDL 31 (L) 04/19/2020 1046   HDL 38 (L) 12/29/2019 0935   CHOLHDL 6.0 04/19/2020 1046   VLDL 16 04/19/2020 1046  LDLCALC 139 (H) 04/19/2020 1046   LDLCALC 57 12/29/2019 0935      Wt Readings from Last 3 Encounters:  12/29/20 225 lb 6.4 oz (102.2 kg)  09/06/20 220 lb (99.8 kg)  08/24/20 220 lb (99.8 kg)      Other studies Reviewed: Additional studies/ records that were reviewed today include:   Left heart catheterization 04/19/20: Prox RCA-1 lesion is 70% stenosed. Prox RCA-2 lesion is 70% stenosed. Mid RCA lesion is 70% stenosed. RV Branch lesion is 50% stenosed. Ost LAD lesion is 80% stenosed. Mid LM to Dist LM lesion is 45% stenosed. 1st LPL lesion is 30% stenosed. 1st Diag lesion is 30% stenosed.   The present study demonstrates no significant change in the previous 45 - 50% distal left main stenosis.   The LAD ostium has 80% stenosis.  There is mild 30% narrowing in the first diagonal branch.  Competitive filling is present in the mid LAD due to LIMA flow.   There was a large ramus immediate vessel with significant tortuosity without significant stenoses.   The left circumflex vessel is a dominant vessel which has mild luminal irregularity.  There is 30% stenosis in the distal  marginal branch.   The RCA is nondominant but diffusely diseased with stenoses of 70% in the proximal mid and mid distal segment with 70% marginal stenoses.   Patent LIMA to LAD.   Mild LV dysfunction with EF estimated in the 45 to 50% range.  Appears to be mild anterolateral hypocontractility.  LVEDP is significantly elevated at 30 to 32 mm.  There appears to be 2+ angiographic mitral regurgitation.   RECOMMENDATION: Increase medical therapy for underlying CAD.  Diuresis with significant LVEDP elevation.  2D echo Doppler study to further assess systolic and diastolic function and mitral regurgitation.  Smoking cessation is imperative.  Aggressive blood pressure control and lipid management.   Diagnostic Dominance: Left       Echocardiogram: 04/20/20: 1. Left ventricular ejection fraction, by estimation, is 50 to 55%. The  left ventricle has low normal function. The left ventricle has no regional  wall motion abnormalities. There is moderate left ventricular hypertrophy.  Left ventricular diastolic  parameters are consistent with Grade I diastolic dysfunction (impaired  relaxation).   2. Right ventricular systolic function is normal. The right ventricular  size is normal.   3. The mitral valve is normal in structure. Trivial mitral valve  regurgitation. No evidence of mitral stenosis.   4. The aortic valve is tricuspid. Aortic valve regurgitation is not  visualized. No aortic stenosis is present.   5. There is borderline dilatation of the aortic root, measuring 38 mm.  There is mild dilatation of the ascending aorta, measuring 39 mm.   6. The inferior vena cava is normal in size with greater than 50%  respiratory variability, suggesting right atrial pressure of 3 mmHg.  _____________    ASSESSMENT AND PLAN:  1. CAD s/p CABG in 2018: had some DOE recently as well as fleeting sharp left sided chest pain which occurs with activity and rest but no recent SL nitro use. In hindsight  he thinks he may have had similar symptoms early on prior to his CABG. - Will check a NST to r/o ischemia and evaluate EF - Shared Decision Making/Informed Consent{ The risks [chest pain, shortness of breath, cardiac arrhythmias, dizziness, blood pressure fluctuations, myocardial infarction, stroke/transient ischemic attack, nausea, vomiting, allergic reaction, radiation exposure, metallic taste sensation and life-threatening complications (estimated to be 1 in  10,000)], benefits (risk stratification, diagnosing coronary artery disease, treatment guidance) and alternatives of a nuclear stress test were discussed in detail with Mr. Frayne and he agrees to proceed. - Continue aspirin and statin - Continue Bblocker and imdur  2. Chronic diastolic CHF: had some DOE recently - Will check a NST as above to evaluate for ischemia and EF - if reassuring, would consider a referral to pulmonology given longstanding history of tobacco use - Continue carvedilol and valsartan-HCTZ  3. HTN: BP 143/86 today. Recent medication mix up which seems to be straightened out after a visit at the [facility]. BP yesterday was 120s/80s. He was provided a BP cuff in office today for close monitoring at home - Instructed patient to keep a BP log and notify the office if BP consistently >130/80 - Continue carvedilol, amlodipine, and valsartan-HCTZ - Consider addition of spironolactone if BP is elevated as he is on maximum therapy of above medications - Low salt diet encouraged.  4. HLD: LDL 139 04/2020 - has not had repeat lipids since statin adjustment during admission 04/2020. Was likely taking atorvastatin 80mg  BID recently which was corrected to daily at a visit with [facility] clinic 12/27/20 - Continue atorvastatin 80mg  daily - Will check FLP/CMET to ensure liver function is stable - he will return next week at his convenience  5. Tobacco abuse: has cut back from 2 ppd to 1 cigarette per day which he was  congratulated for.  - Continue to encourage cessation   6. Suspected OSA: highly suspicious symptoms for OSA given snoring, apneic episodes, PND, and daytime somnolence with very high Epworth score. - Will order a sleep study    Current medicines are reviewed at length with the patient today.  The patient does not have concerns regarding medicines.  The following changes have been made:  As above  Labs/ tests ordered today include:   Orders Placed This Encounter  Procedures   Lipid panel   Comprehensive metabolic panel   Cardiac Stress Test: Informed Consent Details: Physician/Practitioner Attestation; Transcribe to consent form and obtain patient signature   MYOCARDIAL PERFUSION IMAGING   EKG 12-Lead   Split night study     Disposition:   FU with Dr. 12/29/20 in 4 months  Signed, , PA-C  12/29/2020 12:46 PM

## 2020-12-29 NOTE — Telephone Encounter (Signed)
Pt states he spoke to Dr. Delford Field regarding his medications already.

## 2021-01-02 DIAGNOSIS — E782 Mixed hyperlipidemia: Secondary | ICD-10-CM | POA: Diagnosis not present

## 2021-01-02 DIAGNOSIS — Z951 Presence of aortocoronary bypass graft: Secondary | ICD-10-CM | POA: Diagnosis not present

## 2021-01-02 DIAGNOSIS — I10 Essential (primary) hypertension: Secondary | ICD-10-CM | POA: Diagnosis not present

## 2021-01-02 DIAGNOSIS — I25718 Atherosclerosis of autologous vein coronary artery bypass graft(s) with other forms of angina pectoris: Secondary | ICD-10-CM | POA: Diagnosis not present

## 2021-01-02 DIAGNOSIS — I5032 Chronic diastolic (congestive) heart failure: Secondary | ICD-10-CM | POA: Diagnosis not present

## 2021-01-02 LAB — LIPID PANEL
Chol/HDL Ratio: 4.8 ratio (ref 0.0–5.0)
Cholesterol, Total: 157 mg/dL (ref 100–199)
HDL: 33 mg/dL — ABNORMAL LOW (ref >39)
LDL Chol Calc (NIH): 104 mg/dL — ABNORMAL HIGH (ref 0–99)
Triglycerides: 108 mg/dL (ref 0–149)
VLDL Cholesterol Cal: 20 mg/dL (ref 5–40)

## 2021-01-02 LAB — COMPREHENSIVE METABOLIC PANEL
ALT: 19 IU/L (ref 0–44)
AST: 14 IU/L (ref 0–40)
Albumin/Globulin Ratio: 1.3 (ref 1.2–2.2)
Albumin: 3.9 g/dL (ref 3.8–4.8)
Alkaline Phosphatase: 101 IU/L (ref 44–121)
BUN/Creatinine Ratio: 15 (ref 10–24)
BUN: 13 mg/dL (ref 8–27)
Bilirubin Total: <0.2 mg/dL (ref 0.0–1.2)
CO2: 24 mmol/L (ref 20–29)
Calcium: 9.5 mg/dL (ref 8.6–10.2)
Chloride: 102 mmol/L (ref 96–106)
Creatinine, Ser: 0.89 mg/dL (ref 0.76–1.27)
Globulin, Total: 2.9 g/dL (ref 1.5–4.5)
Glucose: 89 mg/dL (ref 65–99)
Potassium: 4.4 mmol/L (ref 3.5–5.2)
Sodium: 138 mmol/L (ref 134–144)
Total Protein: 6.8 g/dL (ref 6.0–8.5)
eGFR: 97 mL/min/{1.73_m2} (ref >59)

## 2021-01-03 ENCOUNTER — Encounter: Payer: Self-pay | Admitting: Physician Assistant

## 2021-01-03 NOTE — Progress Notes (Signed)
Edward Farmer missed 2 sets of night pills because he was not there.   Other than that, has been feeling ok and taking his meds.  Kept Cards appt, but Edward Farmer said they were coming and then said the ride was cancelled. He was able to get another ride.   BP has been ok, got a BP cuff from [facility], SBP 113-134 Will continue to check daily.  Has Lexi MV ordered for 09/06, was encouraged to keep this, transportation arranged.  Sleep study recommended, but not scheduled yet. Encouraged him to do this, should be done at facility, not appropriate to do at the shelter.  Med box filled, refills have been ordered.   Having GI issues, some abd pain and occ constipation, no blood, no black tarry stools. Has Culturelle, will take it and come back next week if no better.  [name], PA-C 01/03/2021 4:10 PM

## 2021-01-04 ENCOUNTER — Other Ambulatory Visit: Payer: Self-pay

## 2021-01-04 ENCOUNTER — Telehealth: Payer: Self-pay

## 2021-01-04 DIAGNOSIS — Z20822 Contact with and (suspected) exposure to covid-19: Secondary | ICD-10-CM | POA: Diagnosis not present

## 2021-01-04 DIAGNOSIS — Z789 Other specified health status: Secondary | ICD-10-CM

## 2021-01-04 DIAGNOSIS — E782 Mixed hyperlipidemia: Secondary | ICD-10-CM

## 2021-01-04 NOTE — Telephone Encounter (Addendum)
Called patient and could not leave a voice message due to voice mailbox is not set up. I will try calling patient again at a later time.    Placed referral order   Released to MyChart   ----- Message from Beatriz Stallion, PA-C sent at 01/02/2021  8:09 PM EDT ----- Please notify the patient that his bad cholesterol continues to be poorly controlled despite being on high dose cholesterol medicine. At this point, I would like to refer him to the lipid clinic for PCSK9-inhibitor consideration given history of CAD s/p CABG. Thank you!

## 2021-01-05 NOTE — Telephone Encounter (Signed)
Patient is returning call.  °

## 2021-01-05 NOTE — Telephone Encounter (Signed)
Returned call to patient. Made patient aware of results and recommendations. Patient verbalized understanding. Referral is already in.

## 2021-01-09 ENCOUNTER — Telehealth (HOSPITAL_COMMUNITY): Payer: Self-pay | Admitting: *Deleted

## 2021-01-09 NOTE — Telephone Encounter (Signed)
Close encounter 

## 2021-01-10 ENCOUNTER — Telehealth: Payer: Self-pay | Admitting: *Deleted

## 2021-01-10 ENCOUNTER — Encounter: Payer: Self-pay | Admitting: Critical Care Medicine

## 2021-01-10 ENCOUNTER — Ambulatory Visit (HOSPITAL_COMMUNITY)
Admission: RE | Admit: 2021-01-10 | Payer: Medicaid Other | Source: Ambulatory Visit | Attending: Medical | Admitting: Medical

## 2021-01-10 NOTE — Telephone Encounter (Signed)
Prior Authorization for split night sleep study sent to [facility] via web portal. Tracking Number [id number].

## 2021-01-11 ENCOUNTER — Other Ambulatory Visit (HOSPITAL_COMMUNITY): Payer: Self-pay

## 2021-01-11 NOTE — Progress Notes (Signed)
Patient ID: Edward Farmer, male   DOB: [birth year], 60 y.o.   MRN: [id number] This is a 60 year old male seen in the [facility] clinic he has underlying cardiovascular disease been having chest pain and shortness of breath which was thought to be an angina equivalent he has a myocardial perfusion scan upcoming and has reengaged with cardiology we have been organizing his medications through a pill organizer and this was achieved yet again today we also went over all the patient's medications and he is been compliant with all medications.  He indicates he may be moving out of the shelter soon and about to achieve housing.  He does not need any refills at this time  Patient was reminded of upcoming appointments with me on September 12 and he will keep his appointment with cardiology on September 9 for his myocardial perfusion scan  On exam blood pressure 140/90 pulse 77 saturation 96% room air chest was clear cardiac exam was unremarkable there was no edema  Impression is that of coronary disease angina equivalent dyspnea plan is for the patient to keep his myocardial perfusion scan upcoming no medication changes were made and he was reminded of his upcoming appointments

## 2021-01-12 ENCOUNTER — Ambulatory Visit (HOSPITAL_COMMUNITY)
Admission: RE | Admit: 2021-01-12 | Discharge: 2021-01-12 | Disposition: A | Discharge status: Home / Self Care | Payer: Medicaid Other | Source: Ambulatory Visit | Attending: Cardiovascular Disease | Admitting: Cardiovascular Disease

## 2021-01-12 ENCOUNTER — Other Ambulatory Visit: Payer: Self-pay

## 2021-01-12 DIAGNOSIS — R072 Precordial pain: Secondary | ICD-10-CM | POA: Insufficient documentation

## 2021-01-12 LAB — MYOCARDIAL PERFUSION IMAGING
LV dias vol: 120 mL (ref 62–150)
LV sys vol: 60 mL
Nuc Stress EF: 50 %
Peak HR: 83 {beats}/min
Rest HR: 61 {beats}/min
Rest Nuclear Isotope Dose: 10.9 mCi
SDS: 1
SRS: 2
SSS: 3
ST Depression (mm): 0 mm
Stress Nuclear Isotope Dose: 28.7 mCi
TID: 1.12

## 2021-01-12 MED ORDER — TECHNETIUM TC 99M TETROFOSMIN IV KIT
28.7000 | PACK | Freq: Once | INTRAVENOUS | Status: AC | PRN
  Administered 2021-01-12: 28.7 via INTRAVENOUS
  Filled 2021-01-12: qty 29

## 2021-01-12 MED ORDER — REGADENOSON 0.4 MG/5ML IV SOLN
0.4000 mg | Freq: Once | INTRAVENOUS | Status: AC
  Administered 2021-01-12: 0.4 mg via INTRAVENOUS

## 2021-01-12 MED ORDER — TECHNETIUM TC 99M TETROFOSMIN IV KIT
10.9000 | PACK | Freq: Once | INTRAVENOUS | Status: AC | PRN
  Administered 2021-01-12: 10.9 via INTRAVENOUS
  Filled 2021-01-12: qty 11

## 2021-01-12 NOTE — Telephone Encounter (Signed)
Received approval from [facility] Medicaid for sleep study. Appointment made and patient notified. Auth # [id number]. Valid dates 02/02/21 to 03/05/21.

## 2021-01-15 DIAGNOSIS — Z20822 Contact with and (suspected) exposure to covid-19: Secondary | ICD-10-CM | POA: Diagnosis not present

## 2021-01-17 ENCOUNTER — Encounter: Payer: Self-pay | Admitting: Physician Assistant

## 2021-01-17 ENCOUNTER — Other Ambulatory Visit: Payer: Self-pay | Admitting: *Deleted

## 2021-01-17 NOTE — Progress Notes (Signed)
Pt c/o constipation, was given Senna-S, he is to take 3 capsules daily.  He was given a written reminder of his appt w/ Dr Delford Field on 09/27.  He is requesting help w/ his medications. He has a daily pill box, Miss Myriam Jacobson filled it for him.  He is waking up during the night, has appt scheduled for split night study 10/25.  Will change his address in the computer to [place] and have packet sent her.   BP 135/85 95% on room air.

## 2021-01-22 DIAGNOSIS — Z20822 Contact with and (suspected) exposure to covid-19: Secondary | ICD-10-CM | POA: Diagnosis not present

## 2021-01-24 ENCOUNTER — Encounter: Payer: Self-pay | Admitting: Physician Assistant

## 2021-01-24 NOTE — Progress Notes (Signed)
Pt still not breathing that well.  Had MV 09/09:  MYOCARDIAL PERFUSION IMAGING  Result Date: 01/12/2021   Findings are consistent with no prior ischemia and no prior myocardial infarction. The study is low risk.   No ST deviation was noted.   LV perfusion is normal. There is no evidence of ischemia. There is no evidence of infarction.   Nuclear stress EF: 50 %. The left ventricular ejection fraction is mildly decreased (45-54%). No ischemia or infarct Apical hypokinesis with EF 50% similar to previous cath/echo    He has a sleep study scheduled, 10/25 at 8 pm.  He is hopeful that he will get an apartment soon, was told it would be 2 weeks, that was 2 weeks ago.  He says he is compliant w/ meds and filling the pill boxes appropriately. Was asked to make the boxes available next week to be checked.   Was reminded of appt w/ Dr Delford Field on 09/27 at 4 pm  He continues to smoke, but denies ETOH or drugs.  BP 135/85, 72 hr , 98% O2  Pt continues to complain of constipation. The Dulcolax is not helping. Dulcolax increased to 3 capsules bid.  He will pick up Miralax next Tues at Dr Lynelle Doctor office.  He was given bus passes for his appt w/ Dr Delford Field.

## 2021-01-26 DIAGNOSIS — Z20822 Contact with and (suspected) exposure to covid-19: Secondary | ICD-10-CM | POA: Diagnosis not present

## 2021-01-30 ENCOUNTER — Other Ambulatory Visit: Payer: Self-pay

## 2021-01-30 ENCOUNTER — Ambulatory Visit: Payer: Medicaid Other | Attending: Critical Care Medicine | Admitting: Critical Care Medicine

## 2021-01-30 ENCOUNTER — Encounter: Payer: Self-pay | Admitting: Critical Care Medicine

## 2021-01-30 VITALS — BP 140/100 | HR 79 | Resp 16 | Wt 226.6 lb

## 2021-01-30 DIAGNOSIS — Z6832 Body mass index (BMI) 32.0-32.9, adult: Secondary | ICD-10-CM

## 2021-01-30 DIAGNOSIS — I25718 Atherosclerosis of autologous vein coronary artery bypass graft(s) with other forms of angina pectoris: Secondary | ICD-10-CM | POA: Diagnosis not present

## 2021-01-30 DIAGNOSIS — Z8249 Family history of ischemic heart disease and other diseases of the circulatory system: Secondary | ICD-10-CM | POA: Diagnosis not present

## 2021-01-30 DIAGNOSIS — F17210 Nicotine dependence, cigarettes, uncomplicated: Secondary | ICD-10-CM | POA: Diagnosis not present

## 2021-01-30 DIAGNOSIS — Z7982 Long term (current) use of aspirin: Secondary | ICD-10-CM | POA: Insufficient documentation

## 2021-01-30 DIAGNOSIS — F17200 Nicotine dependence, unspecified, uncomplicated: Secondary | ICD-10-CM

## 2021-01-30 DIAGNOSIS — L309 Dermatitis, unspecified: Secondary | ICD-10-CM

## 2021-01-30 DIAGNOSIS — Z760 Encounter for issue of repeat prescription: Secondary | ICD-10-CM | POA: Diagnosis not present

## 2021-01-30 DIAGNOSIS — I10 Essential (primary) hypertension: Secondary | ICD-10-CM | POA: Insufficient documentation

## 2021-01-30 DIAGNOSIS — I25118 Atherosclerotic heart disease of native coronary artery with other forms of angina pectoris: Secondary | ICD-10-CM | POA: Diagnosis not present

## 2021-01-30 DIAGNOSIS — Z951 Presence of aortocoronary bypass graft: Secondary | ICD-10-CM | POA: Insufficient documentation

## 2021-01-30 DIAGNOSIS — Z716 Tobacco abuse counseling: Secondary | ICD-10-CM | POA: Diagnosis not present

## 2021-01-30 DIAGNOSIS — Z79899 Other long term (current) drug therapy: Secondary | ICD-10-CM | POA: Diagnosis not present

## 2021-01-30 MED ORDER — ISOSORBIDE MONONITRATE ER 30 MG PO TB24
30.0000 mg | ORAL_TABLET | Freq: Every day | ORAL | 2 refills | Status: DC
Start: 2021-01-30 — End: 2021-05-15
  Filled 2021-01-30: qty 60, 60d supply, fill #0

## 2021-01-30 MED ORDER — ASPIRIN EC 81 MG PO TBEC
81.0000 mg | DELAYED_RELEASE_TABLET | Freq: Every day | ORAL | 3 refills | Status: DC
  Filled 2021-01-30: qty 90, 90d supply, fill #0

## 2021-01-30 MED ORDER — ATORVASTATIN CALCIUM 80 MG PO TABS
80.0000 mg | ORAL_TABLET | Freq: Every day | ORAL | 1 refills | Status: DC
  Filled 2021-01-30: qty 60, 60d supply, fill #0

## 2021-01-30 MED ORDER — VALSARTAN-HYDROCHLOROTHIAZIDE 320-25 MG PO TABS
1.0000 | ORAL_TABLET | Freq: Every day | ORAL | 3 refills | Status: DC
  Filled 2021-01-30: qty 60, 60d supply, fill #0

## 2021-01-30 MED ORDER — AMLODIPINE BESYLATE 10 MG PO TABS
10.0000 mg | ORAL_TABLET | Freq: Every day | ORAL | 3 refills | Status: DC
  Filled 2021-01-30: qty 60, 60d supply, fill #0

## 2021-01-30 MED ORDER — CARVEDILOL 25 MG PO TABS
25.0000 mg | ORAL_TABLET | Freq: Two times a day (BID) | ORAL | 3 refills | Status: DC
  Filled 2021-01-30: qty 120, 60d supply, fill #0

## 2021-01-30 NOTE — Assessment & Plan Note (Signed)
Diet will be improved once he leaves the shelter

## 2021-01-30 NOTE — Assessment & Plan Note (Signed)
  .   Current smoking consumption amount: 2 cigarettes a week  . Dicsussion on advise to quit smoking and smoking impacts: Cardiovascular impacts  . Patient's willingness to quit: Willing to quit  . Methods to quit smoking discussed: Behavioral modification  . Medication management of smoking session drugs discussed: None  . Resources provided:  AVS   . Setting quit date not established  . Follow-up arranged 1 month  Time spent counseling the patient: 5 minutes   

## 2021-01-30 NOTE — Progress Notes (Signed)
Established Patient Office Visit  Subjective:  Patient ID: Edward Farmer, male    DOB: February 05, 1961  Age: 60 y.o. MRN: [id number]  CC:  Chief Complaint  Patient presents with   Hypertension   Medication Refill    HPI Edward Farmer presents for primary care follow-up and medication refill.  Patient is out of a couple of his blood pressure medicines and so his blood pressure today is elevated at 140/100.  The good news is he is about to have placement into an apartment through the [employer].  Below is the last visit with me at the [facility] seen [facility]/10/[facility] This is a 60 year old male seen in the [facility] he has underlying cardiovascular disease been having chest pain and shortness of breath which was thought to be an angina equivalent he has a myocardial perfusion scan upcoming and has reengaged with cardiology we have been organizing his medications through a pill organizer and this was achieved yet again today we also went over all the patient's medications and he is been compliant with all medications.  He indicates he may be moving out of the shelter soon and about to achieve housing.  He does not need any refills at this time   Patient was reminded of upcoming appointments with me on September 12 and he will keep his appointment with cardiology on September 9 for his myocardial perfusion scan   On exam blood pressure 140/90 pulse 77 saturation 96% room air chest was clear cardiac exam was unremarkable there was no edema  Impression is that of coronary disease angina equivalent dyspnea plan is for the patient to keep his myocardial perfusion scan upcoming no medication changes were made and he was reminded of his upcoming appointments  The myocardial perfusion scan was normal.  He does have an upcoming split-night CPAP sleep study that is pending.  Patient has shortness of breath with exertion only there are no other  complaints.  He is down to 1 cigarette daily.   Past Medical History:  Diagnosis Date   Asthma    Bradycardia 07/14/2016   on BB. Rate improved with discontinuation.    Coronary artery disease    Hypertension    Tobacco abuse     Past Surgical History:  Procedure Laterality Date   CORONARY ARTERY BYPASS GRAFT N/A 07/17/2016   Procedure: CORONARY ARTERY BYPASS GRAFTING (CABG) x 1. OFF PUMP. LIMA TO LAD.;  Surgeon: [name], MD;  Location: [facility];  Service: Open Heart Surgery;  Laterality: N/A;   LEFT HEART CATH AND CORONARY ANGIOGRAPHY N/A 07/16/2016   Procedure: Left Heart Cath and Coronary Angiography;  Surgeon: [name], MD;  Location: [facility];  Service: Cardiovascular;  Laterality: N/A;   LEFT HEART CATH AND CORONARY ANGIOGRAPHY N/A 04/19/2020   Procedure: LEFT HEART CATH AND CORONARY ANGIOGRAPHY;  Surgeon: [name], MD;  Location: [facility];  Service: Cardiovascular;  Laterality: N/A;   TEE WITHOUT CARDIOVERSION N/A 07/17/2016   Procedure: TRANSESOPHAGEAL ECHOCARDIOGRAM (TEE);  Surgeon: [name], MD;  Location: [facility];  Service: Open Heart Surgery;  Laterality: N/A;    Family History  Problem Relation Age of Onset   Migraines Mother    Heart attack Father     Social History   Socioeconomic History   Marital status: Legally Separated    Spouse name: Not on file   Number of children: Not on  file   Years of education: Not on file   Highest education level: Not on file  Occupational History   Not on file  Tobacco Use   Smoking status: Every Day    Packs/day: 0.25    Types: Cigars, Cigarettes   Smokeless tobacco: Never   Tobacco comments:    cutting back  Vaping Use   Vaping Use: Never used  Substance and Sexual Activity   Alcohol use: Yes    Alcohol/week: 0.0 standard drinks    Comment: occ   Drug use: No   Sexual activity: Not on file  Other Topics Concern   Not on file  Social History Narrative   Not on file    Social Determinants of Health   Financial Resource Strain: Not on file  Food Insecurity: Not on file  Transportation Needs: No Transportation Needs   Lack of Transportation (Medical): No   Lack of Transportation (Non-Medical): No  Physical Activity: Not on file  Stress: Not on file  Social Connections: Not on file  Intimate Partner Violence: Not on file    Outpatient Medications Prior to Visit  Medication Sig Dispense Refill   diclofenac Sodium (VOLTAREN) 1 % GEL Apply 2 g topically 4 (four) times daily. 100 g 0   nitroGLYCERIN (NITROSTAT) 0.4 MG SL tablet Place 1 tablet (0.4 mg total) under the tongue every 5 (five) minutes x 3 doses as needed for chest pain. 25 tablet 3   triamcinolone cream (KENALOG) 0.1 % Apply 1 application topically 2 (two) times daily. 30 g 0   amLODipine (NORVASC) 10 MG tablet Take 1 tablet (10 mg total) by mouth daily. 60 tablet 3   aspirin EC 81 MG tablet Take 1 tablet (81 mg total) by mouth daily. Swallow whole. 90 tablet 3   atorvastatin (LIPITOR) 80 MG tablet Take 1 tablet (80 mg total) by mouth daily at 6 PM. 60 tablet 1   carvedilol (COREG) 25 MG tablet Take 1 tablet (25 mg total) by mouth 2 (two) times daily with a meal. 120 tablet 3   isosorbide mononitrate (IMDUR) 30 MG 24 hr tablet Take 1 tablet (30 mg total) by mouth daily. 30 tablet 1   valsartan-hydrochlorothiazide (DIOVAN-HCT) 320-25 MG tablet Take 1 tablet by mouth daily. 60 tablet 3   No facility-administered medications prior to visit.    No Known Allergies  ROS Review of Systems  Constitutional: Negative.   HENT: Negative.  Negative for ear pain, postnasal drip, rhinorrhea, sinus pressure, sore throat, trouble swallowing and voice change.   Eyes: Negative.   Respiratory:  Positive for shortness of breath. Negative for apnea, cough, choking, chest tightness, wheezing and stridor.   Cardiovascular: Negative.  Negative for chest pain, palpitations and leg swelling.  Gastrointestinal:  Negative.  Negative for abdominal distention, abdominal pain, nausea and vomiting.  Genitourinary: Negative.   Musculoskeletal: Negative.  Negative for arthralgias and myalgias.  Skin: Negative.  Negative for rash.  Allergic/Immunologic: Negative.  Negative for environmental allergies and food allergies.  Neurological: Negative.  Negative for dizziness, syncope, weakness and headaches.  Hematological: Negative.  Negative for adenopathy. Does not bruise/bleed easily.  Psychiatric/Behavioral: Negative.  Negative for agitation and sleep disturbance. The patient is not nervous/anxious.      Objective:    Physical Exam Vitals reviewed.  Constitutional:      Appearance: Normal appearance. He is well-developed. He is obese. He is not diaphoretic.  HENT:     Head: Normocephalic and atraumatic.  Nose: No nasal deformity, septal deviation, mucosal edema or rhinorrhea.     Right Sinus: No maxillary sinus tenderness or frontal sinus tenderness.     Left Sinus: No maxillary sinus tenderness or frontal sinus tenderness.     Mouth/Throat:     Pharynx: No oropharyngeal exudate.     Comments: Poor dentition Eyes:     General: No scleral icterus.    Conjunctiva/sclera: Conjunctivae normal.     Pupils: Pupils are equal, round, and reactive to light.  Neck:     Thyroid: No thyromegaly.     Vascular: No carotid bruit or JVD.     Trachea: Trachea normal. No tracheal tenderness or tracheal deviation.  Cardiovascular:     Rate and Rhythm: Normal rate and regular rhythm.     Chest Wall: PMI is not displaced.     Pulses: Normal pulses. No decreased pulses.     Heart sounds: Normal heart sounds, S1 normal and S2 normal. Heart sounds not distant. No murmur heard. No systolic murmur is present.  No diastolic murmur is present.    No friction rub. No gallop. No S3 or S4 sounds.  Pulmonary:     Effort: No tachypnea, accessory muscle usage or respiratory distress.     Breath sounds: No stridor. No  decreased breath sounds, wheezing, rhonchi or rales.  Chest:     Chest wall: No tenderness.  Abdominal:     General: Bowel sounds are normal. There is no distension.     Palpations: Abdomen is soft. Abdomen is not rigid.     Tenderness: There is no abdominal tenderness. There is no guarding or rebound.  Musculoskeletal:        General: Normal range of motion.     Cervical back: Normal range of motion and neck supple. No edema, erythema or rigidity. No muscular tenderness. Normal range of motion.  Lymphadenopathy:     Head:     Right side of head: No submental or submandibular adenopathy.     Left side of head: No submental or submandibular adenopathy.     Cervical: No cervical adenopathy.  Skin:    General: Skin is warm and dry.     Coloration: Skin is not pale.     Findings: No rash.     Nails: There is no clubbing.  Neurological:     Mental Status: He is alert and oriented to person, place, and time.     Sensory: No sensory deficit.  Psychiatric:        Speech: Speech normal.        Behavior: Behavior normal.    BP (!) 140/100   Pulse 79   Resp 16   Wt 226 lb 9.6 oz (102.8 kg)   SpO2 95%   BMI 32.51 kg/m  Wt Readings from Last 3 Encounters:  01/30/21 226 lb 9.6 oz (102.8 kg)  01/12/21 225 lb (102.1 kg)  12/29/20 225 lb 6.4 oz (102.2 kg)     Health Maintenance Due  Topic Date Due   Fecal DNA (Cologuard)  Never done   Zoster Vaccines- Shingrix (1 of 2) Never done   COVID-19 Vaccine (2 - Booster for Janssen series) 01/19/2020   INFLUENZA VACCINE  12/04/2020    There are no preventive care reminders to display for this patient.  Lab Results  Component Value Date   TSH 1.210 12/29/2019   Lab Results  Component Value Date   WBC 8.3 04/20/2020   HGB 14.1 04/20/2020   HCT  42.4 04/20/2020   MCV 88.0 04/20/2020   PLT 204 04/20/2020   Lab Results  Component Value Date   NA 138 01/02/2021   K 4.4 01/02/2021   CO2 24 01/02/2021   GLUCOSE 89 01/02/2021   BUN  13 01/02/2021   CREATININE 0.89 01/02/2021   BILITOT <0.2 01/02/2021   ALKPHOS 101 01/02/2021   AST 14 01/02/2021   ALT 19 01/02/2021   PROT 6.8 01/02/2021   ALBUMIN 3.9 01/02/2021   CALCIUM 9.5 01/02/2021   ANIONGAP 12 04/21/2020   EGFR 97 01/02/2021   Lab Results  Component Value Date   CHOL 157 01/02/2021   Lab Results  Component Value Date   HDL 33 (L) 01/02/2021   Lab Results  Component Value Date   LDLCALC 104 (H) 01/02/2021   Lab Results  Component Value Date   TRIG 108 01/02/2021   Lab Results  Component Value Date   CHOLHDL 4.8 01/02/2021   Lab Results  Component Value Date   HGBA1C 6.0 (H) 04/19/2020    Imaging Results  MYOCARDIAL PERFUSION IMAGING   Result Date: 01/12/2021   Findings are consistent with no prior ischemia and no prior myocardial infarction. The study is low risk.   No ST deviation was noted.   LV perfusion is normal. There is no evidence of ischemia. There is no evidence of infarction.   Nuclear stress EF: 50 %. The left ventricular ejection fraction is mildly decreased (45-54%). No ischemia or infarct Apical hypokinesis with EF 50% similar to previous cath/echo         Assessment & Plan:   Problem List Items Addressed This Visit       Cardiovascular and Mediastinum   Essential hypertension    Hypertension stable at this time no changes in medications refills will be given  Blood pressure is up because he is out of his beta-blocker and ARB      Relevant Medications   amLODipine (NORVASC) 10 MG tablet   aspirin EC 81 MG tablet   atorvastatin (LIPITOR) 80 MG tablet   carvedilol (COREG) 25 MG tablet   isosorbide mononitrate (IMDUR) 30 MG 24 hr tablet   valsartan-hydrochlorothiazide (DIOVAN-HCT) 320-25 MG tablet   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)   Relevant Medications   amLODipine (NORVASC) 10 MG tablet   aspirin EC 81 MG tablet   atorvastatin (LIPITOR) 80 MG tablet    carvedilol (COREG) 25 MG tablet   isosorbide mononitrate (IMDUR) 30 MG 24 hr tablet   valsartan-hydrochlorothiazide (DIOVAN-HCT) 320-25 MG tablet     Musculoskeletal and Integument   Dermatitis    Stable off steroid cream        Other   BMI 32.0-32.9,adult    Diet will be improved once he leaves the shelter      Tobacco dependence       Current smoking consumption amount: 2 cigarettes a week  Dicsussion on advise to quit smoking and smoking impacts: Cardiovascular impacts  Patient's willingness to quit: Willing to quit  Methods to quit smoking discussed: Behavioral modification  Medication management of smoking session drugs discussed: None  Resources provided:  AVS   Setting quit date not established  Follow-up arranged 1 month  Time spent counseling the patient: 5 minutes         Meds ordered this encounter  Medications   amLODipine (NORVASC) 10 MG tablet    Sig: Take 1 tablet (10 mg total) by mouth  daily.    Dispense:  60 tablet    Refill:  3   aspirin EC 81 MG tablet    Sig: Take 1 tablet (81 mg total) by mouth daily. Swallow whole.    Dispense:  90 tablet    Refill:  3   atorvastatin (LIPITOR) 80 MG tablet    Sig: Take 1 tablet (80 mg total) by mouth daily at 6 PM.    Dispense:  60 tablet    Refill:  1   carvedilol (COREG) 25 MG tablet    Sig: Take 1 tablet (25 mg total) by mouth 2 (two) times daily with a meal.    Dispense:  120 tablet    Refill:  3   isosorbide mononitrate (IMDUR) 30 MG 24 hr tablet    Sig: Take 1 tablet (30 mg total) by mouth daily.    Dispense:  60 tablet    Refill:  2   valsartan-hydrochlorothiazide (DIOVAN-HCT) 320-25 MG tablet    Sig: Take 1 tablet by mouth daily.    Dispense:  60 tablet    Refill:  3   Plan Cologuard colon cancer screening once he obtains housing  Follow-up: Return in about 2 months (around 04/01/2021).    [name], MD

## 2021-01-30 NOTE — Assessment & Plan Note (Signed)
Stable off steroid cream

## 2021-01-30 NOTE — Assessment & Plan Note (Addendum)
Hypertension stable at this time no changes in medications refills will be given  Blood pressure is up because he is out of his beta-blocker and ARB

## 2021-01-30 NOTE — Patient Instructions (Signed)
Refills on all medications sent to our pharmacy  Bus passes given for trip back tomorrow to pick up medications  You declined a flu vaccine  Keep your sleep study appointment  A cologuard colon cancer test will be ordered once you get into your apartment  Return Dr Delford Field 2 months

## 2021-01-31 ENCOUNTER — Other Ambulatory Visit: Payer: Self-pay

## 2021-02-07 ENCOUNTER — Other Ambulatory Visit: Payer: Self-pay

## 2021-02-16 DIAGNOSIS — Z1152 Encounter for screening for COVID-19: Secondary | ICD-10-CM | POA: Diagnosis not present

## 2021-02-27 ENCOUNTER — Other Ambulatory Visit: Payer: Self-pay

## 2021-02-27 ENCOUNTER — Ambulatory Visit (HOSPITAL_BASED_OUTPATIENT_CLINIC_OR_DEPARTMENT_OTHER): Payer: Medicaid Other | Attending: Medical | Admitting: Cardiovascular Disease

## 2021-02-27 DIAGNOSIS — R0600 Dyspnea, unspecified: Secondary | ICD-10-CM

## 2021-02-27 DIAGNOSIS — G4733 Obstructive sleep apnea (adult) (pediatric): Secondary | ICD-10-CM | POA: Diagnosis not present

## 2021-02-27 DIAGNOSIS — R0683 Snoring: Secondary | ICD-10-CM | POA: Diagnosis not present

## 2021-02-27 DIAGNOSIS — R400 Somnolence: Secondary | ICD-10-CM

## 2021-03-12 DIAGNOSIS — Z1152 Encounter for screening for COVID-19: Secondary | ICD-10-CM | POA: Diagnosis not present

## 2021-03-19 ENCOUNTER — Telehealth: Payer: Self-pay | Admitting: Critical Care Medicine

## 2021-03-19 NOTE — Telephone Encounter (Signed)
Patient called has has sleep study. He is wanting to know when and if he will receive cpcp machine. Please call back

## 2021-03-20 ENCOUNTER — Telehealth: Payer: Self-pay | Admitting: Cardiovascular Disease

## 2021-03-20 NOTE — Telephone Encounter (Signed)
See encounter

## 2021-03-20 NOTE — Telephone Encounter (Signed)
Patient calling for sleep study results. °

## 2021-03-20 NOTE — Telephone Encounter (Signed)
Returned a call to the patient. Informed him that his sleep study has not been read. He was very understanding stating that he only called because he was told he would have the results in 7-10 days. I apologized for not having his results and told him that once it has been read, it will be given to me to contact him with results and recommendations. Patient voiced understanding and thanked me for calling him.

## 2021-03-20 NOTE — Telephone Encounter (Signed)
Waiting on Dr Tresa Endo to read the study it has not been read as of yet

## 2021-03-20 NOTE — Telephone Encounter (Signed)
Will forward to pcp for prescription for cpap.

## 2021-03-21 ENCOUNTER — Encounter (HOSPITAL_BASED_OUTPATIENT_CLINIC_OR_DEPARTMENT_OTHER): Payer: Self-pay | Admitting: Cardiovascular Disease

## 2021-03-21 DIAGNOSIS — Z1152 Encounter for screening for COVID-19: Secondary | ICD-10-CM | POA: Diagnosis not present

## 2021-03-21 NOTE — Telephone Encounter (Signed)
Called Pt to let him know the information, he informed me that Dr.Kelly had called him yesterday.

## 2021-03-21 NOTE — Procedures (Signed)
Patient Name: Edward Farmer, Edward Farmer Date: 02/27/2021 Gender: Male D.O.B: 08/23/1960 Age (years): 60 Referring Provider: Roby Lofts PA-C Height (inches): 69 Interpreting Physician: Shelva Majestic MD, ABSM Weight (lbs): 220 RPSGT: Laren Everts BMI: 32 MRN: [id number] Neck Size: 16.50  CLINICAL INFORMATION The patient is referred for a split night study with BPAP.  MEDICATIONS amLODipine (NORVASC) 10 MG tablet aspirin EC 81 MG tablet atorvastatin (LIPITOR) 80 MG tablet carvedilol (COREG) 25 MG tablet diclofenac Sodium (VOLTAREN) 1 % GEL isosorbide mononitrate (IMDUR) 30 MG 24 hr tablet nitroGLYCERIN (NITROSTAT) 0.4 MG SL tablet triamcinolone cream (KENALOG) 0.1 % valsartan-hydrochlorothiazide (DIOVAN-HCT) 320-25 MG tablet Medications self-administered by patient taken the night of the study : N/A  SLEEP STUDY TECHNIQUE As per the AASM Manual for the Scoring of Sleep and Associated Events v2.3 (April 2016) with a hypopnea requiring 4% desaturations.  The channels recorded and monitored were frontal, central and occipital EEG, electrooculogram (EOG), submentalis EMG (chin), nasal and oral airflow, thoracic and abdominal wall motion, anterior tibialis EMG, snore microphone, electrocardiogram, and pulse oximetry. Bi-level positive airway pressure (BiPAP) was initiated when the patient met split night criteria and was titrated according to treat sleep-disordered breathing.  RESPIRATORY PARAMETERS Diagnostic Total AHI (/hr): 88.8 RDI (/hr): 88.8 OA Index (/hr): 54.8 CA Index (/hr): 2.3 REM AHI (/hr): 70.0 NREM AHI (/hr): 91.8 Supine AHI (/hr): 88.0 Non-supine AHI (/hr): 90.40 Min O2 Sat (%): 62.0 Mean O2 (%): 89.2 Time below 88% (min): 46.6   Titration Optimal IPAP Pressure (cm): 26 Optimal EPAP Pressure (cm): 22 AHI at Optimal Pressure (/hr): 6.1 Min O2 at Optimal Pressure (%): 90.0 Sleep % at Optimal (%): 81 Supine % at Optimal (%): 100   SLEEP  ARCHITECTURE The study was initiated at 9:44:42 PM and terminated at 4:39:52 AM. The total recorded time was 415.2 minutes. EEG confirmed total sleep time was 349 minutes yielding a sleep efficiency of 84.1%%. Sleep onset after lights out was 13.1 minutes with a REM latency of 126.5 minutes. The patient spent 24.4%% of the night in stage N1 sleep, 48.3%% in stage N2 sleep, 0.0%% in stage N3 and 27.4% in REM. Wake after sleep onset (WASO) was 53.1 minutes. The Arousal Index was 53.1/hour.  LEG MOVEMENT DATA The total Periodic Limb Movements of Sleep (PLMS) were 0. The PLMS index was 0.0 .  CARDIAC DATA The 2 lead EKG demonstrated sinus rhythm. The mean heart rate was 100.0 beats per minute. Other EKG findings include: PVCs.  IMPRESSIONS - Severe obstructive sleep apnea occurred during the diagnostic portion of the study (AHI 88.8 /hour). CPAP was initiated at 5 cm and titrated to 20 cm before transitioning to BiPAP. BiPAP was titrated to 26/22 cm of water. - No significant central sleep apnea occurred during the diagnostic portion of the study (CAI = 2.3/hour). - Severe oxygen desaturation was noted during the diagnostic portion of the study to a nadir of 76% with NREM sleep and 62% during REM sleep. - The patient snored with moderate snoring volume during the diagnostic portion of the study. - EKG findings include PVCs. - Clinically significant periodic limb movements of sleep did not occur during the study.  DIAGNOSIS - Obstructive Sleep Apnea (G47.33)  RECOMMENDATIONS - Recommend a trial of BiPAP therapy with EPAP min of 20, pressure support of 4 and IPAP max of 26 cm H2O with heated humidification. A  Medium size Fisher&Paykel Full Face Mask F&P Vitera (new) mask was used for the titration. -  Effort should be made to optimize nasal and orpharyngeal patency. - Avoid alcohol, sedatives and other CNS depressants that may worsen sleep apnea and disrupt normal sleep architecture. - Sleep  hygiene should be reviewed to assess factors that may improve sleep quality. - Weight management (BMI 32) and regular exercise should be initiated or continued. - Recommend a download in 30 days and sleep clinic evaluation after one month of therapy.   [Electronically signed] 03/21/2021 06:38 PM  Shelva Majestic MD, [facility], [place], American Board of Sleep Medicine   NPI: [id number]  [place] PH: [phone]   FX: [phone] [place]

## 2021-03-28 ENCOUNTER — Telehealth: Payer: Self-pay | Admitting: *Deleted

## 2021-03-28 NOTE — Telephone Encounter (Signed)
-----   Message from Lennette Bihari, MD sent at 03/21/2021  6:44 PM EST ----- Burna Mortimer, please notify pt of results;  scoring had to be re-done by lab contributing to a delay in the final report.  Set up with DME for BiPAP

## 2021-03-28 NOTE — Telephone Encounter (Signed)
Attempted to contact the patient to discuss sleep study results and recommendations. No answer. Could not leave a message. Voice mailbox has not been set up.

## 2021-04-02 ENCOUNTER — Telehealth: Payer: Self-pay | Admitting: Cardiovascular Disease

## 2021-04-02 NOTE — Telephone Encounter (Signed)
Patient notified of sleep study results and recommendations. All of the patient's questions were answered to his satisfaction. He thanked me for explaining everything so that he could understand it. States that I am the only person that has. He agrees to proceed with BIPAP therapy. Order has been sent to [facility].

## 2021-04-02 NOTE — Telephone Encounter (Signed)
Patient has not gotten his CPAP machine yet. He is not sure what to do. Please advise

## 2021-04-02 NOTE — Telephone Encounter (Signed)
-----   Message from Lennette Bihari, MD sent at 03/21/2021  6:44 PM EST ----- Edward Farmer, please notify pt of results;  scoring had to be re-done by lab contributing to a delay in the final report.  Set up with DME for BiPAP

## 2021-04-02 NOTE — Telephone Encounter (Signed)
Routed to sleep coordinator to review/assist

## 2021-04-10 ENCOUNTER — Telehealth: Payer: Self-pay | Admitting: Critical Care Medicine

## 2021-04-10 NOTE — Telephone Encounter (Signed)
Called patient to get scheduled for an appt with Dr. Delford Field in December. No answer and vm not set up. If patient returns call transfer to office

## 2021-04-10 NOTE — Telephone Encounter (Signed)
Patient returned call back to schedule appt or December. Pleae cal back

## 2021-04-10 NOTE — Telephone Encounter (Signed)
-----   Message from [name], MD sent at 03/22/2021 12:25 PM EST ----- Needs appt with me in December

## 2021-04-15 NOTE — Progress Notes (Incomplete)
Established Patient Office Visit  Subjective:  Patient ID: Edward Farmer, male    DOB: 08/12/1960  Age: 60 y.o. MRN: [id number]  CC: No chief complaint on file.   HPI Edward Farmer presents for *** Cpap, flu pcv cologuard Past Medical History:  Diagnosis Date   Asthma    Bradycardia 07/14/2016   on BB. Rate improved with discontinuation.    Coronary artery disease    Hypertension    Tobacco abuse     Past Surgical History:  Procedure Laterality Date   CORONARY ARTERY BYPASS GRAFT N/A 07/17/2016   Procedure: CORONARY ARTERY BYPASS GRAFTING (CABG) x 1. OFF PUMP. LIMA TO LAD.;  Surgeon: [name], MD;  Location: [place];  Service: Open Heart Surgery;  Laterality: N/A;   LEFT HEART CATH AND CORONARY ANGIOGRAPHY N/A 07/16/2016   Procedure: Left Heart Cath and Coronary Angiography;  Surgeon: [name], MD;  Location: [place] [facility];  Service: Cardiovascular;  Laterality: N/A;   LEFT HEART CATH AND CORONARY ANGIOGRAPHY N/A 04/19/2020   Procedure: LEFT HEART CATH AND CORONARY ANGIOGRAPHY;  Surgeon: [name], MD;  Location: [place] [facility];  Service: Cardiovascular;  Laterality: N/A;   TEE WITHOUT CARDIOVERSION N/A 07/17/2016   Procedure: TRANSESOPHAGEAL ECHOCARDIOGRAM (TEE);  Surgeon: [name], MD;  Location: [place];  Service: Open Heart Surgery;  Laterality: N/A;    Family History  Problem Relation Age of Onset   Migraines Mother    Heart attack Father     Social History   Socioeconomic History   Marital status: Legally Separated    Spouse name: Not on file   Number of children: Not on file   Years of education: Not on file   Highest education level: Not on file  Occupational History   Not on file  Tobacco Use   Smoking status: Every Day    Packs/day: 0.25    Types: Cigars, Cigarettes   Smokeless tobacco: Never   Tobacco comments:    cutting back  Vaping Use   Vaping Use: Never used  Substance and Sexual Activity    Alcohol use: Yes    Alcohol/week: 0.0 standard drinks    Comment: occ   Drug use: No   Sexual activity: Not on file  Other Topics Concern   Not on file  Social History Narrative   Not on file   Social Determinants of Health   Financial Resource Strain: Not on file  Food Insecurity: Not on file  Transportation Needs: No Transportation Needs   Lack of Transportation (Medical): No   Lack of Transportation (Non-Medical): No  Physical Activity: Not on file  Stress: Not on file  Social Connections: Not on file  Intimate Partner Violence: Not on file    Outpatient Medications Prior to Visit  Medication Sig Dispense Refill   amLODipine (NORVASC) 10 MG tablet Take 1 tablet (10 mg total) by mouth daily. 60 tablet 3   aspirin EC 81 MG tablet Take 1 tablet (81 mg total) by mouth daily. Swallow whole. 90 tablet 3   atorvastatin (LIPITOR) 80 MG tablet Take 1 tablet (80 mg total) by mouth daily at 6 PM. 60 tablet 1   carvedilol (COREG) 25 MG tablet Take 1 tablet (25 mg total) by mouth 2 (two) times daily with a meal. 120 tablet 3   diclofenac Sodium (VOLTAREN) 1 % GEL Apply 2 g topically 4 (four) times daily. 100 g 0   isosorbide mononitrate (IMDUR)  30 MG 24 hr tablet Take 1 tablet (30 mg total) by mouth daily. 60 tablet 2   nitroGLYCERIN (NITROSTAT) 0.4 MG SL tablet Place 1 tablet (0.4 mg total) under the tongue every 5 (five) minutes x 3 doses as needed for chest pain. 25 tablet 3   triamcinolone cream (KENALOG) 0.1 % Apply 1 application topically 2 (two) times daily. 30 g 0   valsartan-hydrochlorothiazide (DIOVAN-HCT) 320-25 MG tablet Take 1 tablet by mouth daily. 60 tablet 3   No facility-administered medications prior to visit.    No Known Allergies  ROS Review of Systems    Objective:    Physical Exam  There were no vitals taken for this visit. Wt Readings from Last 3 Encounters:  02/27/21 220 lb (99.8 kg)  01/30/21 226 lb 9.6 oz (102.8 kg)  01/12/21 225  lb (102.1 kg)     Health Maintenance Due  Topic Date Due   Pneumococcal Vaccine 53-50 Years old (1 - PCV) Never done   Fecal DNA (Cologuard)  Never done   Zoster Vaccines- Shingrix (1 of 2) Never done   COVID-19 Vaccine (2 - Booster for Janssen series) 01/19/2020   INFLUENZA VACCINE  12/04/2020    There are no preventive care reminders to display for this patient.  Lab Results  Component Value Date   TSH 1.210 12/29/2019   Lab Results  Component Value Date   WBC 8.3 04/20/2020   HGB 14.1 04/20/2020   HCT 42.4 04/20/2020   MCV 88.0 04/20/2020   PLT 204 04/20/2020   Lab Results  Component Value Date   NA 138 01/02/2021   K 4.4 01/02/2021   CO2 24 01/02/2021   GLUCOSE 89 01/02/2021   BUN 13 01/02/2021   CREATININE 0.89 01/02/2021   BILITOT <0.2 01/02/2021   ALKPHOS 101 01/02/2021   AST 14 01/02/2021   ALT 19 01/02/2021   PROT 6.8 01/02/2021   ALBUMIN 3.9 01/02/2021   CALCIUM 9.5 01/02/2021   ANIONGAP 12 04/21/2020   EGFR 97 01/02/2021   Lab Results  Component Value Date   CHOL 157 01/02/2021   Lab Results  Component Value Date   HDL 33 (L) 01/02/2021   Lab Results  Component Value Date   LDLCALC 104 (H) 01/02/2021   Lab Results  Component Value Date   TRIG 108 01/02/2021   Lab Results  Component Value Date   CHOLHDL 4.8 01/02/2021   Lab Results  Component Value Date   HGBA1C 6.0 (H) 04/19/2020      Assessment & Plan:   Problem List Items Addressed This Visit   None   No orders of the defined types were placed in this encounter.   Follow-up: No follow-ups on file.    [name], MD

## 2021-04-16 ENCOUNTER — Ambulatory Visit: Payer: Medicaid Other | Admitting: Critical Care Medicine

## 2021-04-20 ENCOUNTER — Ambulatory Visit: Payer: Medicaid Other | Admitting: Cardiovascular Disease

## 2021-04-23 ENCOUNTER — Telehealth: Payer: Self-pay | Admitting: Cardiovascular Disease

## 2021-04-23 DIAGNOSIS — G4733 Obstructive sleep apnea (adult) (pediatric): Secondary | ICD-10-CM | POA: Diagnosis not present

## 2021-04-23 NOTE — Telephone Encounter (Signed)
Patient calling to inform he picked up the CPAP machine.

## 2021-04-24 DIAGNOSIS — Z1152 Encounter for screening for COVID-19: Secondary | ICD-10-CM | POA: Diagnosis not present

## 2021-04-26 NOTE — Telephone Encounter (Signed)
Aware. Notice received from [facility].

## 2021-05-06 DIAGNOSIS — G4733 Obstructive sleep apnea (adult) (pediatric): Secondary | ICD-10-CM | POA: Diagnosis not present

## 2021-05-09 ENCOUNTER — Ambulatory Visit (HOSPITAL_BASED_OUTPATIENT_CLINIC_OR_DEPARTMENT_OTHER): Payer: Medicaid Other | Admitting: Internal Medicine

## 2021-05-15 ENCOUNTER — Telehealth: Payer: Self-pay

## 2021-05-15 ENCOUNTER — Ambulatory Visit: Payer: Medicaid Other | Attending: Critical Care Medicine | Admitting: Critical Care Medicine

## 2021-05-15 ENCOUNTER — Other Ambulatory Visit: Payer: Self-pay

## 2021-05-15 ENCOUNTER — Encounter: Payer: Self-pay | Admitting: Critical Care Medicine

## 2021-05-15 DIAGNOSIS — I10 Essential (primary) hypertension: Secondary | ICD-10-CM

## 2021-05-15 DIAGNOSIS — I25718 Atherosclerosis of autologous vein coronary artery bypass graft(s) with other forms of angina pectoris: Secondary | ICD-10-CM

## 2021-05-15 MED ORDER — AMLODIPINE BESYLATE 10 MG PO TABS
10.0000 mg | ORAL_TABLET | Freq: Every day | ORAL | 3 refills | Status: DC
  Filled 2021-05-15: qty 30, 30d supply, fill #0

## 2021-05-15 MED ORDER — ATORVASTATIN CALCIUM 80 MG PO TABS
80.0000 mg | ORAL_TABLET | Freq: Every day | ORAL | 1 refills | Status: DC
  Filled 2021-05-15: qty 30, 30d supply, fill #0

## 2021-05-15 MED ORDER — ISOSORBIDE MONONITRATE ER 30 MG PO TB24
30.0000 mg | ORAL_TABLET | Freq: Every day | ORAL | 2 refills | Status: DC
  Filled 2021-05-15: qty 30, 30d supply, fill #0

## 2021-05-15 MED ORDER — CARVEDILOL 25 MG PO TABS
25.0000 mg | ORAL_TABLET | Freq: Two times a day (BID) | ORAL | 3 refills | Status: DC
  Filled 2021-05-15: qty 60, 30d supply, fill #0

## 2021-05-15 MED ORDER — VALSARTAN-HYDROCHLOROTHIAZIDE 320-25 MG PO TABS
1.0000 | ORAL_TABLET | Freq: Every day | ORAL | 3 refills | Status: DC
  Filled 2021-05-15: qty 30, 30d supply, fill #0

## 2021-05-15 NOTE — Assessment & Plan Note (Signed)
Not sure exactly how well his blood pressure is controlled at this time I did take down the address of his rooming house and I plan to refill all medications to pharmacy and get him into the office short-term basis

## 2021-05-15 NOTE — Telephone Encounter (Signed)
-----   Message from [name], MD sent at 05/15/2021  2:37 PM EST ----- This pt needs OV face to face with me in February

## 2021-05-15 NOTE — Telephone Encounter (Signed)
Pt was called and informed of upcoming appointment. °

## 2021-05-15 NOTE — Progress Notes (Signed)
Established Patient Office Visit  Subjective:  Patient ID: Edward Farmer, male    DOB: 1960-09-07  Age: 61 y.o. MRN: [id number] Virtual Visit via Telephone Note  I connected with Edward Farmer on 05/15/21 at  2:30 PM EST by telephone and verified that I am speaking with the correct person using two identifiers.   Consent:  I discussed the limitations, risks, security and privacy concerns of performing an evaluation and management service by telephone and the availability of in person appointments. I also discussed with the patient that there may be a patient responsible charge related to this service. The patient expressed understanding and agreed to proceed.  Location of patient: Patient's at work  Location of provider: I am in the office  Persons participating in the televisit with the patient.   No one else on the call the patient is in a secure location and cannot connect to video so this is a phone visit and is brief as he forgot the appointment    History of Present Illness: Patient is in need of refills is a 61 year old male with history of hypertension now has housing in a rooming house.  He is in better spirits now that he has moved out of the homeless shelter.  He does have a CPAP device now.  We did not have time to get into major symptoms or other or other concerns.  He does need refills on medications.   Past Medical History:  Diagnosis Date   Asthma    Bradycardia 07/14/2016   on BB. Rate improved with discontinuation.    Coronary artery disease    Hypertension    Tobacco abuse     Past Surgical History:  Procedure Laterality Date   CORONARY ARTERY BYPASS GRAFT N/A 07/17/2016   Procedure: CORONARY ARTERY BYPASS GRAFTING (CABG) x 1. OFF PUMP. LIMA TO LAD.;  Surgeon: [name], MD;  Location: [place];  Service: Open Heart Surgery;  Laterality: N/A;   LEFT HEART CATH AND CORONARY ANGIOGRAPHY N/A 07/16/2016   Procedure: Left Heart Cath and Coronary Angiography;   Surgeon: [name], MD;  Location: [place] [facility];  Service: Cardiovascular;  Laterality: N/A;   LEFT HEART CATH AND CORONARY ANGIOGRAPHY N/A 04/19/2020   Procedure: LEFT HEART CATH AND CORONARY ANGIOGRAPHY;  Surgeon: [name], MD;  Location: [place] [facility];  Service: Cardiovascular;  Laterality: N/A;   TEE WITHOUT CARDIOVERSION N/A 07/17/2016   Procedure: TRANSESOPHAGEAL ECHOCARDIOGRAM (TEE);  Surgeon: [name], MD;  Location: [place];  Service: Open Heart Surgery;  Laterality: N/A;    Family History  Problem Relation Age of Onset   Migraines Mother    Heart attack Father     Social History   Socioeconomic History   Marital status: Legally Separated    Spouse name: Not on file   Number of children: Not on file   Years of education: Not on file   Highest education level: Not on file  Occupational History   Not on file  Tobacco Use   Smoking status: Every Day    Packs/day: 0.25    Types: Cigars, Cigarettes   Smokeless tobacco: Never   Tobacco comments:    cutting back  Vaping Use   Vaping Use: Never used  Substance and Sexual Activity   Alcohol use: Yes    Alcohol/week: 0.0 standard drinks    Comment: occ   Drug use: No   Sexual activity: Not on file  Other Topics Concern   Not on file  Social History Narrative   Not on file   Social Determinants of Health   Financial Resource Strain: Not on file  Food Insecurity: Not on file  Transportation Needs: Not on file  Physical Activity: Not on file  Stress: Not on file  Social Connections: Not on file  Intimate Partner Violence: Not on file    Outpatient Medications Prior to Visit  Medication Sig Dispense Refill   aspirin EC 81 MG tablet Take 1 tablet (81 mg total) by mouth daily. Swallow whole. 90 tablet 3   diclofenac Sodium (VOLTAREN) 1 % GEL Apply 2 g topically 4 (four) times daily. 100 g 0   nitroGLYCERIN (NITROSTAT) 0.4 MG SL tablet Place 1 tablet (0.4 mg total) under the tongue every  5 (five) minutes x 3 doses as needed for chest pain. 25 tablet 3   triamcinolone cream (KENALOG) 0.1 % Apply 1 application topically 2 (two) times daily. 30 g 0   amLODipine (NORVASC) 10 MG tablet Take 1 tablet (10 mg total) by mouth daily. 60 tablet 3   atorvastatin (LIPITOR) 80 MG tablet Take 1 tablet (80 mg total) by mouth daily at 6 PM. 60 tablet 1   carvedilol (COREG) 25 MG tablet Take 1 tablet (25 mg total) by mouth 2 (two) times daily with a meal. 120 tablet 3   isosorbide mononitrate (IMDUR) 30 MG 24 hr tablet Take 1 tablet (30 mg total) by mouth daily. 60 tablet 2   valsartan-hydrochlorothiazide (DIOVAN-HCT) 320-25 MG tablet Take 1 tablet by mouth daily. 60 tablet 3   No facility-administered medications prior to visit.    No Known Allergies  ROS Review of Systems    Objective:    Physical Exam  There were no vitals taken for this visit. Wt Readings from Last 3 Encounters:  02/27/21 220 lb (99.8 kg)  01/30/21 226 lb 9.6 oz (102.8 kg)  01/12/21 225 lb (102.1 kg)     Health Maintenance Due  Topic Date Due   Pneumococcal Vaccine 83-84 Years old (1 - PCV) Never done   Fecal DNA (Cologuard)  Never done   Zoster Vaccines- Shingrix (1 of 2) Never done   COVID-19 Vaccine (2 - Booster for Janssen series) 01/19/2020   INFLUENZA VACCINE  12/04/2020    There are no preventive care reminders to display for this patient.  Lab Results  Component Value Date   TSH 1.210 12/29/2019   Lab Results  Component Value Date   WBC 8.3 04/20/2020   HGB 14.1 04/20/2020   HCT 42.4 04/20/2020   MCV 88.0 04/20/2020   PLT 204 04/20/2020   Lab Results  Component Value Date   NA 138 01/02/2021   K 4.4 01/02/2021   CO2 24 01/02/2021   GLUCOSE 89 01/02/2021   BUN 13 01/02/2021   CREATININE 0.89 01/02/2021   BILITOT <0.2 01/02/2021   ALKPHOS 101 01/02/2021   AST 14 01/02/2021   ALT 19 01/02/2021   PROT 6.8 01/02/2021   ALBUMIN 3.9 01/02/2021   CALCIUM 9.5 01/02/2021   ANIONGAP  12 04/21/2020   EGFR 97 01/02/2021   Lab Results  Component Value Date   CHOL 157 01/02/2021   Lab Results  Component Value Date   HDL 33 (L) 01/02/2021   Lab Results  Component Value Date   LDLCALC 104 (H) 01/02/2021   Lab Results  Component Value Date   TRIG 108 01/02/2021   Lab Results  Component Value  Date   CHOLHDL 4.8 01/02/2021   Lab Results  Component Value Date   HGBA1C 6.0 (H) 04/19/2020      Assessment & Plan:   Problem List Items Addressed This Visit       Cardiovascular and Mediastinum   Essential hypertension    Not sure exactly how well his blood pressure is controlled at this time I did take down the address of his rooming house and I plan to refill all medications to pharmacy and get him into the office short-term basis      Relevant Medications   amLODipine (NORVASC) 10 MG tablet   atorvastatin (LIPITOR) 80 MG tablet   carvedilol (COREG) 25 MG tablet   isosorbide mononitrate (IMDUR) 30 MG 24 hr tablet   valsartan-hydrochlorothiazide (DIOVAN-HCT) 320-25 MG tablet   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 The patient was provided an opportunity to ask questions and all were answered. The patient agreed with the plan and demonstrated an understanding of the instructions.   The patient was advised to call back or seek an in-person evaluation if the symptoms worsen or if the condition fails to improve as anticipated.  I provided 10 minutes  of non-face-to-face time during this encounter  including  median intraservice time , review of notes, labs, imaging, medications  and explaining diagnosis and management to the patient .    [name], MD

## 2021-05-21 ENCOUNTER — Other Ambulatory Visit: Payer: Self-pay

## 2021-06-05 ENCOUNTER — Other Ambulatory Visit: Payer: Self-pay

## 2021-06-06 DIAGNOSIS — G4733 Obstructive sleep apnea (adult) (pediatric): Secondary | ICD-10-CM | POA: Diagnosis not present

## 2021-06-30 NOTE — Progress Notes (Incomplete)
Established Patient Office Visit  Subjective:  Patient ID: Edward Farmer, male    DOB: 08-12-1960  Age: 61 y.o. MRN: [id number]  CC: No chief complaint on file.   HPI Edward Farmer presents for *** Cologuard flu  Past Medical History:  Diagnosis Date   Asthma    Bradycardia 07/14/2016   on BB. Rate improved with discontinuation.    Coronary artery disease    Hypertension    Tobacco abuse     Past Surgical History:  Procedure Laterality Date   CORONARY ARTERY BYPASS GRAFT N/A 07/17/2016   Procedure: CORONARY ARTERY BYPASS GRAFTING (CABG) x 1. OFF PUMP. LIMA TO LAD.;  Surgeon: [name], MD;  Location: [place];  Service: Open Heart Surgery;  Laterality: N/A;   LEFT HEART CATH AND CORONARY ANGIOGRAPHY N/A 07/16/2016   Procedure: Left Heart Cath and Coronary Angiography;  Surgeon: [name], MD;  Location: [place] [facility];  Service: Cardiovascular;  Laterality: N/A;   LEFT HEART CATH AND CORONARY ANGIOGRAPHY N/A 04/19/2020   Procedure: LEFT HEART CATH AND CORONARY ANGIOGRAPHY;  Surgeon: [name], MD;  Location: [place] [facility];  Service: Cardiovascular;  Laterality: N/A;   TEE WITHOUT CARDIOVERSION N/A 07/17/2016   Procedure: TRANSESOPHAGEAL ECHOCARDIOGRAM (TEE);  Surgeon: [name], MD;  Location: [place];  Service: Open Heart Surgery;  Laterality: N/A;    Family History  Problem Relation Age of Onset   Migraines Mother    Heart attack Father     Social History   Socioeconomic History   Marital status: Legally Separated    Spouse name: Not on file   Number of children: Not on file   Years of education: Not on file   Highest education level: Not on file  Occupational History   Not on file  Tobacco Use   Smoking status: Every Day    Packs/day: 0.25    Types: Cigars, Cigarettes   Smokeless tobacco: Never   Tobacco comments:    cutting back  Vaping Use   Vaping Use: Never used  Substance and Sexual Activity    Alcohol use: Yes    Alcohol/week: 0.0 standard drinks    Comment: occ   Drug use: No   Sexual activity: Not on file  Other Topics Concern   Not on file  Social History Narrative   Not on file   Social Determinants of Health   Financial Resource Strain: Not on file  Food Insecurity: Not on file  Transportation Needs: Not on file  Physical Activity: Not on file  Stress: Not on file  Social Connections: Not on file  Intimate Partner Violence: Not on file    Outpatient Medications Prior to Visit  Medication Sig Dispense Refill   amLODipine (NORVASC) 10 MG tablet Take 1 tablet (10 mg total) by mouth daily. 60 tablet 3   aspirin EC 81 MG tablet Take 1 tablet (81 mg total) by mouth daily. Swallow whole. 90 tablet 3   atorvastatin (LIPITOR) 80 MG tablet Take 1 tablet (80 mg total) by mouth daily at 6 PM. 60 tablet 1   carvedilol (COREG) 25 MG tablet Take 1 tablet (25 mg total) by mouth 2 (two) times daily with a meal. 120 tablet 3   diclofenac Sodium (VOLTAREN) 1 % GEL Apply 2 g topically 4 (four) times daily. 100 g 0   isosorbide mononitrate (IMDUR) 30 MG 24 hr tablet Take 1 tablet (30 mg total) by mouth daily. [place]  tablet 2   nitroGLYCERIN (NITROSTAT) 0.4 MG SL tablet Place 1 tablet (0.4 mg total) under the tongue every 5 (five) minutes x 3 doses as needed for chest pain. 25 tablet 3   triamcinolone cream (KENALOG) 0.1 % Apply 1 application topically 2 (two) times daily. 30 g 0   valsartan-hydrochlorothiazide (DIOVAN-HCT) 320-25 MG tablet Take 1 tablet by mouth daily. 60 tablet 3   No facility-administered medications prior to visit.    No Known Allergies  ROS Review of Systems    Objective:    Physical Exam  There were no vitals taken for this visit. Wt Readings from Last 3 Encounters:  02/27/21 220 lb (99.8 kg)  01/30/21 226 lb 9.6 oz (102.8 kg)  01/12/21 225 lb (102.1 kg)     Health Maintenance Due  Topic Date Due   Fecal DNA (Cologuard)  Never done    Zoster Vaccines- Shingrix (1 of 2) Never done   COVID-19 Vaccine (2 - Booster for Janssen series) 01/19/2020   INFLUENZA VACCINE  12/04/2020    There are no preventive care reminders to display for this patient.  Lab Results  Component Value Date   TSH 1.210 12/29/2019   Lab Results  Component Value Date   WBC 8.3 04/20/2020   HGB 14.1 04/20/2020   HCT 42.4 04/20/2020   MCV 88.0 04/20/2020   PLT 204 04/20/2020   Lab Results  Component Value Date   NA 138 01/02/2021   K 4.4 01/02/2021   CO2 24 01/02/2021   GLUCOSE 89 01/02/2021   BUN 13 01/02/2021   CREATININE 0.89 01/02/2021   BILITOT <0.2 01/02/2021   ALKPHOS 101 01/02/2021   AST 14 01/02/2021   ALT 19 01/02/2021   PROT 6.8 01/02/2021   ALBUMIN 3.9 01/02/2021   CALCIUM 9.5 01/02/2021   ANIONGAP 12 04/21/2020   EGFR 97 01/02/2021   Lab Results  Component Value Date   CHOL 157 01/02/2021   Lab Results  Component Value Date   HDL 33 (L) 01/02/2021   Lab Results  Component Value Date   LDLCALC 104 (H) 01/02/2021   Lab Results  Component Value Date   TRIG 108 01/02/2021   Lab Results  Component Value Date   CHOLHDL 4.8 01/02/2021   Lab Results  Component Value Date   HGBA1C 6.0 (H) 04/19/2020      Assessment & Plan:   Problem List Items Addressed This Visit   None   No orders of the defined types were placed in this encounter.   Follow-up: No follow-ups on file.    [name], MD

## 2021-07-02 ENCOUNTER — Ambulatory Visit: Payer: Medicaid Other | Admitting: Critical Care Medicine

## 2021-07-04 DIAGNOSIS — G4733 Obstructive sleep apnea (adult) (pediatric): Secondary | ICD-10-CM | POA: Diagnosis not present

## 2021-07-11 ENCOUNTER — Ambulatory Visit: Payer: Medicaid Other | Admitting: Cardiovascular Disease

## 2021-07-18 ENCOUNTER — Encounter: Payer: Self-pay | Admitting: Cardiovascular Disease

## 2021-08-04 DIAGNOSIS — G4733 Obstructive sleep apnea (adult) (pediatric): Secondary | ICD-10-CM | POA: Diagnosis not present

## 2021-08-29 DIAGNOSIS — Z1152 Encounter for screening for COVID-19: Secondary | ICD-10-CM | POA: Diagnosis not present

## 2021-08-30 ENCOUNTER — Ambulatory Visit: Payer: Medicaid Other | Admitting: Cardiovascular Disease

## 2021-09-04 ENCOUNTER — Encounter: Payer: Self-pay | Admitting: Cardiovascular Disease

## 2021-09-11 DIAGNOSIS — Z1152 Encounter for screening for COVID-19: Secondary | ICD-10-CM | POA: Diagnosis not present

## 2021-09-16 IMAGING — DX DG KNEE COMPLETE 4+V*R*
4 series · 4 of 4 positions shown · non-contrast
Comparison: None.

CLINICAL DATA: Fall

EXAM:
RIGHT KNEE - COMPLETE 4+ VIEW

[knee ap]
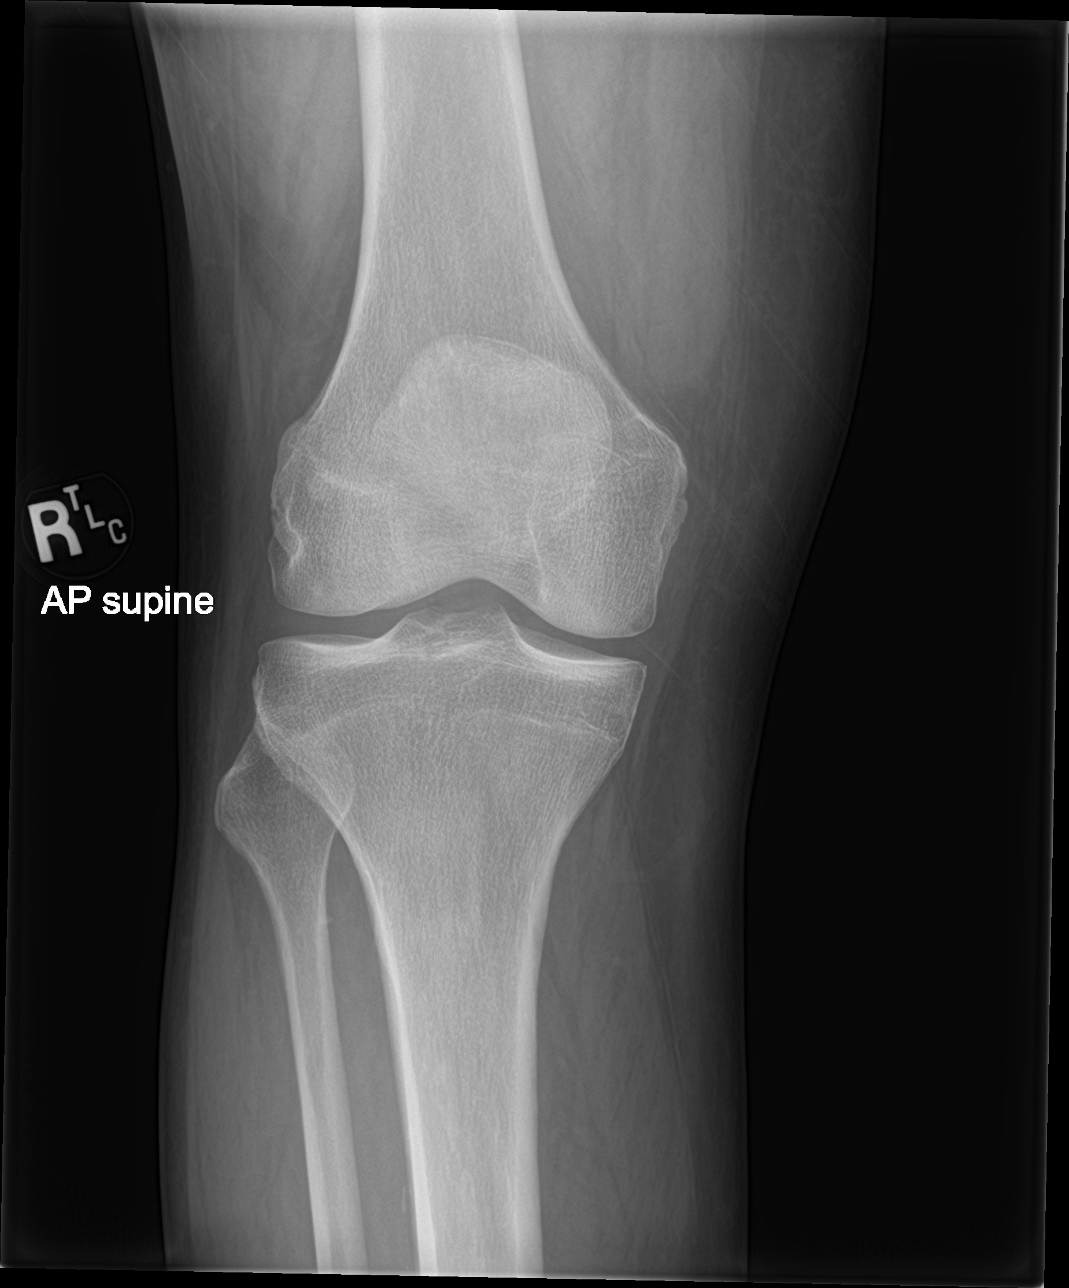

[knee lat]
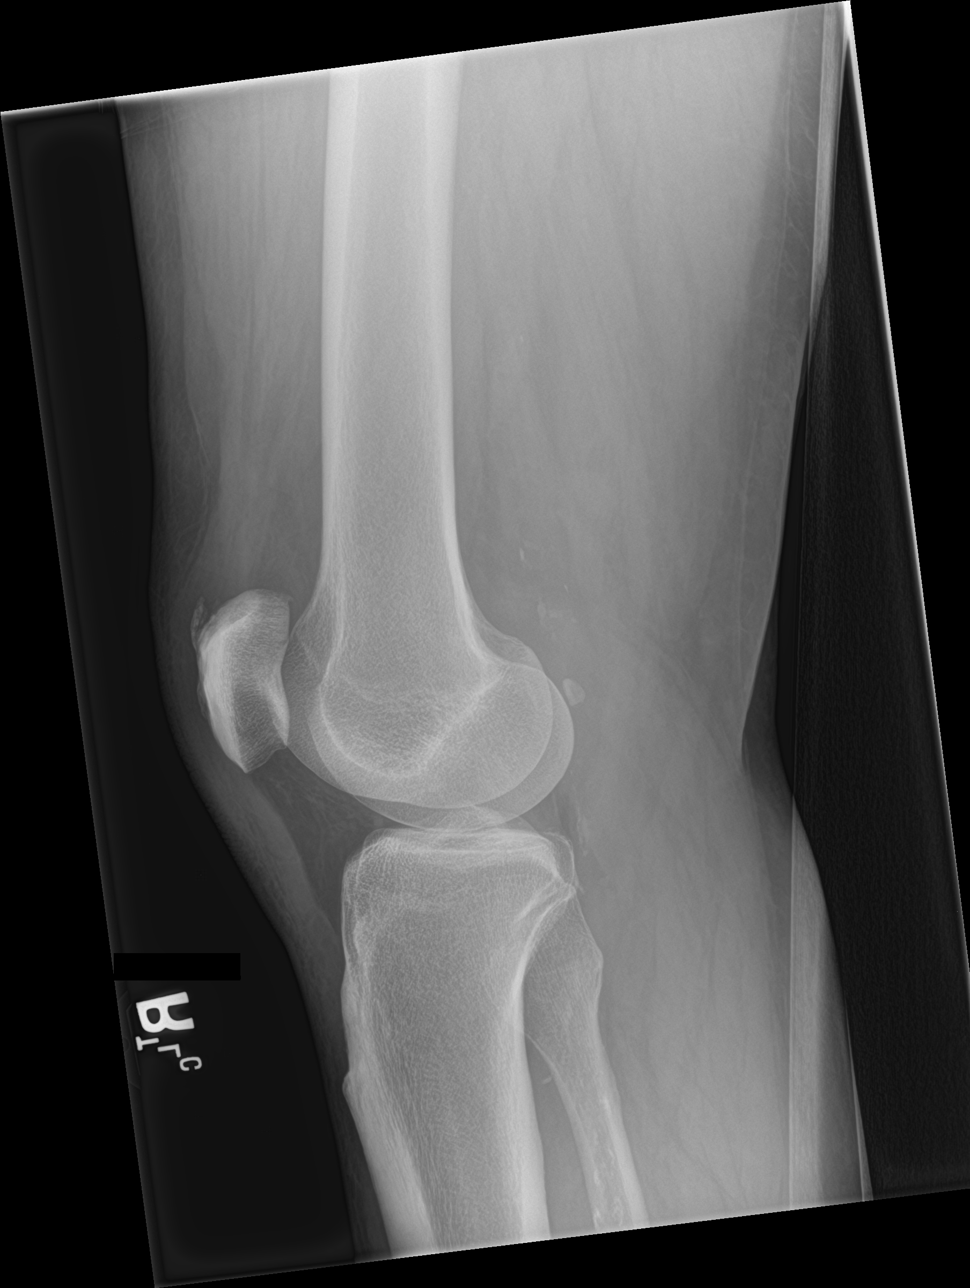

[knee obl (1 of 2)]
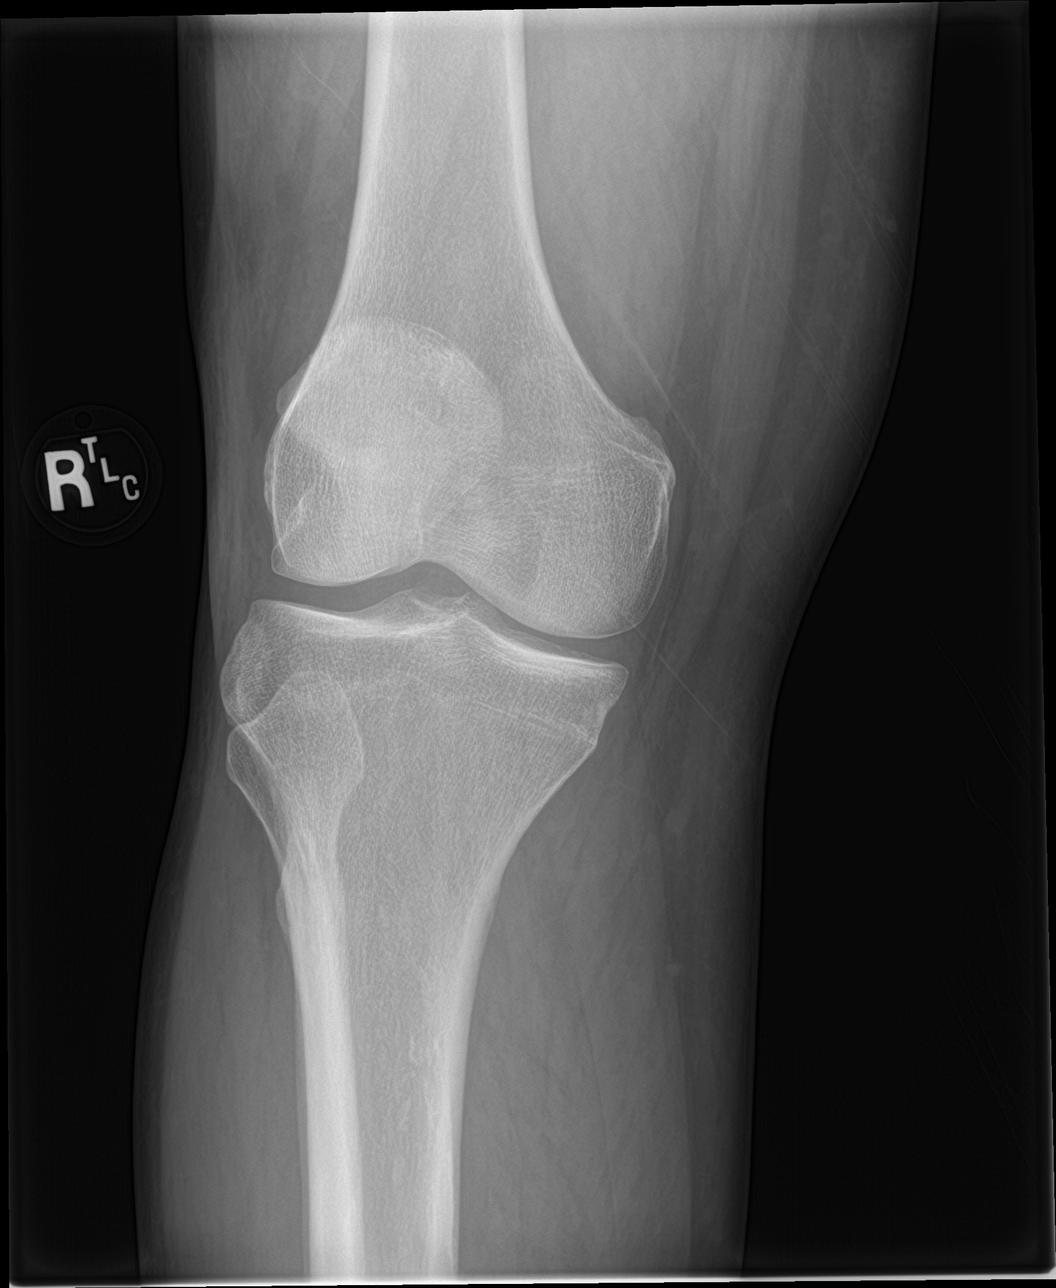

[knee obl (2 of 2)]
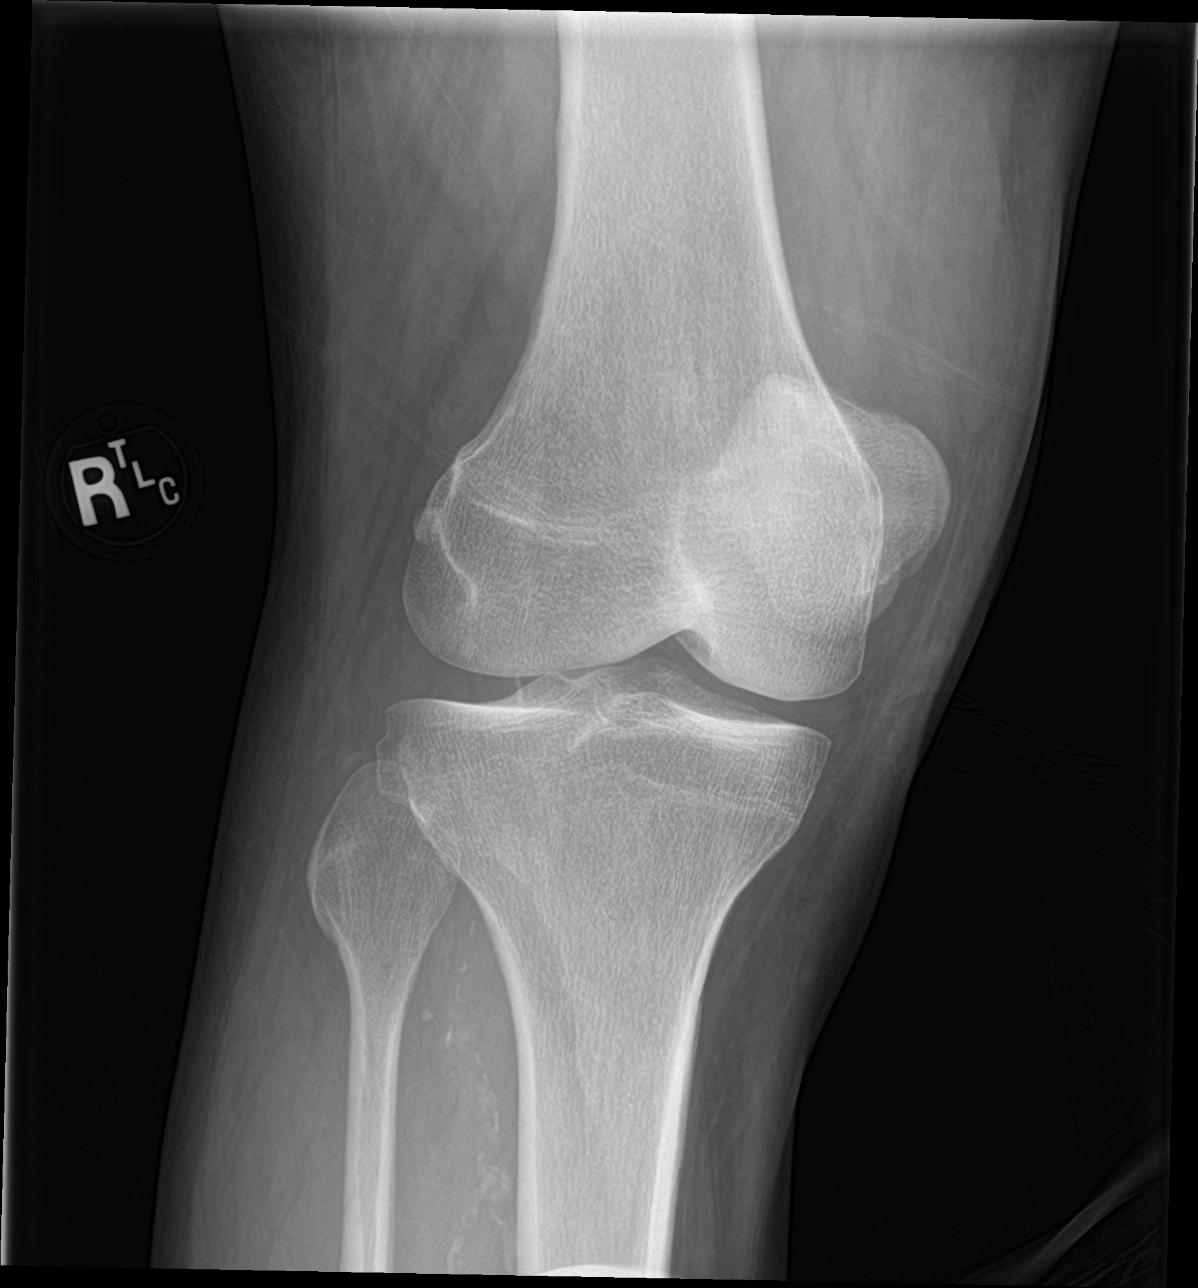

[4 of 4 positions shown; findings below may reference images not displayed]

FINDINGS: There is no acute fracture or dislocation. Alignment is normal. The
joint spaces are preserved. There is mild superior patellar
enthesopathy. There is no significant effusion. Vascular
calcifications are noted. The soft tissues are otherwise
unremarkable.
IMPRESSION: No acute fracture or dislocation.

## 2021-10-03 DIAGNOSIS — Z1152 Encounter for screening for COVID-19: Secondary | ICD-10-CM | POA: Diagnosis not present

## 2021-10-08 ENCOUNTER — Telehealth: Payer: Self-pay | Admitting: *Deleted

## 2021-10-08 NOTE — Telephone Encounter (Signed)
Mychart message sent to  patient to contact the office with updated demographic information, including telephone number.

## 2021-10-16 DIAGNOSIS — Z1152 Encounter for screening for COVID-19: Secondary | ICD-10-CM | POA: Diagnosis not present

## 2021-10-23 DIAGNOSIS — Z1152 Encounter for screening for COVID-19: Secondary | ICD-10-CM | POA: Diagnosis not present

## 2021-11-19 DIAGNOSIS — Z20822 Contact with and (suspected) exposure to covid-19: Secondary | ICD-10-CM | POA: Diagnosis not present

## 2021-11-26 DIAGNOSIS — Z20822 Contact with and (suspected) exposure to covid-19: Secondary | ICD-10-CM | POA: Diagnosis not present

## 2021-12-03 DIAGNOSIS — Z20822 Contact with and (suspected) exposure to covid-19: Secondary | ICD-10-CM | POA: Diagnosis not present

## 2021-12-10 DIAGNOSIS — Z20822 Contact with and (suspected) exposure to covid-19: Secondary | ICD-10-CM | POA: Diagnosis not present

## 2021-12-18 ENCOUNTER — Other Ambulatory Visit (HOSPITAL_COMMUNITY)
Admission: EM | Admit: 2021-12-18 | Discharge: 2021-12-23 | Disposition: A | Discharge status: Home / Self Care | Payer: Medicaid Other | Attending: Psychiatry | Admitting: Psychiatry

## 2021-12-18 DIAGNOSIS — J45909 Unspecified asthma, uncomplicated: Secondary | ICD-10-CM | POA: Insufficient documentation

## 2021-12-18 DIAGNOSIS — F1910 Other psychoactive substance abuse, uncomplicated: Secondary | ICD-10-CM

## 2021-12-18 DIAGNOSIS — F1911 Other psychoactive substance abuse, in remission: Secondary | ICD-10-CM | POA: Diagnosis not present

## 2021-12-18 DIAGNOSIS — I2510 Atherosclerotic heart disease of native coronary artery without angina pectoris: Secondary | ICD-10-CM | POA: Insufficient documentation

## 2021-12-18 DIAGNOSIS — Z20822 Contact with and (suspected) exposure to covid-19: Secondary | ICD-10-CM | POA: Insufficient documentation

## 2021-12-18 DIAGNOSIS — I10 Essential (primary) hypertension: Secondary | ICD-10-CM | POA: Insufficient documentation

## 2021-12-18 DIAGNOSIS — F17210 Nicotine dependence, cigarettes, uncomplicated: Secondary | ICD-10-CM | POA: Insufficient documentation

## 2021-12-18 LAB — CBC WITH DIFFERENTIAL/PLATELET
Abs Immature Granulocytes: 0.05 10*3/uL (ref 0.00–0.07)
Basophils Absolute: 0 10*3/uL (ref 0.0–0.1)
Basophils Relative: 0 %
Eosinophils Absolute: 0.1 10*3/uL (ref 0.0–0.5)
Eosinophils Relative: 2 %
HCT: 41.5 % (ref 39.0–52.0)
Hemoglobin: 13.2 g/dL (ref 13.0–17.0)
Immature Granulocytes: 1 %
Lymphocytes Relative: 30 %
Lymphs Abs: 2.1 10*3/uL (ref 0.7–4.0)
MCH: 26.9 pg (ref 26.0–34.0)
MCHC: 31.8 g/dL (ref 30.0–36.0)
MCV: 84.5 fL (ref 80.0–100.0)
Monocytes Absolute: 0.6 10*3/uL (ref 0.1–1.0)
Monocytes Relative: 9 %
Neutro Abs: 4 10*3/uL (ref 1.7–7.7)
Neutrophils Relative %: 58 %
Platelets: 194 10*3/uL (ref 150–400)
RBC: 4.91 MIL/uL (ref 4.22–5.81)
RDW: 16.1 % — ABNORMAL HIGH (ref 11.5–15.5)
WBC: 6.8 10*3/uL (ref 4.0–10.5)
nRBC: 0 % (ref 0.0–0.2)

## 2021-12-18 LAB — COMPREHENSIVE METABOLIC PANEL
ALT: 17 U/L (ref 0–44)
AST: 17 U/L (ref 15–41)
Albumin: 3.4 g/dL — ABNORMAL LOW (ref 3.5–5.0)
Alkaline Phosphatase: 89 U/L (ref 38–126)
Anion gap: 7 (ref 5–15)
BUN: 15 mg/dL (ref 8–23)
CO2: 25 mmol/L (ref 22–32)
Calcium: 9.3 mg/dL (ref 8.9–10.3)
Chloride: 107 mmol/L (ref 98–111)
Creatinine, Ser: 0.95 mg/dL (ref 0.61–1.24)
GFR, Estimated: >60 mL/min (ref >60)
Glucose, Bld: 95 mg/dL (ref 70–99)
Potassium: 3.8 mmol/L (ref 3.5–5.1)
Sodium: 139 mmol/L (ref 135–145)
Total Bilirubin: 0.4 mg/dL (ref 0.3–1.2)
Total Protein: 7.5 g/dL (ref 6.5–8.1)

## 2021-12-18 LAB — HEMOGLOBIN A1C
Hgb A1c MFr Bld: 5.9 % — ABNORMAL HIGH (ref 4.8–5.6)
Mean Plasma Glucose: 122.63 mg/dL

## 2021-12-18 LAB — RESP PANEL BY RT-PCR (FLU A&B, COVID) ARPGX2
Influenza A by PCR: NEGATIVE
Influenza B by PCR: NEGATIVE
SARS Coronavirus 2 by RT PCR: NEGATIVE

## 2021-12-18 LAB — LIPID PANEL
Cholesterol: 171 mg/dL (ref 0–200)
HDL: 34 mg/dL — ABNORMAL LOW (ref >40)
LDL Cholesterol: 119 mg/dL — ABNORMAL HIGH (ref 0–99)
Total CHOL/HDL Ratio: 5 RATIO
Triglycerides: 88 mg/dL (ref <150)
VLDL: 18 mg/dL (ref 0–40)

## 2021-12-18 LAB — POC SARS CORONAVIRUS 2 AG: SARSCOV2ONAVIRUS 2 AG: NEGATIVE

## 2021-12-18 LAB — POCT URINE DRUG SCREEN - MANUAL ENTRY (I-SCREEN)
POC Amphetamine UR: NOT DETECTED
POC Buprenorphine (BUP): NOT DETECTED
POC Cocaine UR: POSITIVE — AB
POC Marijuana UR: NOT DETECTED
POC Methadone UR: NOT DETECTED
POC Methamphetamine UR: NOT DETECTED
POC Morphine: NOT DETECTED
POC Oxazepam (BZO): NOT DETECTED
POC Oxycodone UR: NOT DETECTED
POC Secobarbital (BAR): NOT DETECTED

## 2021-12-18 LAB — TSH: TSH: 0.875 u[IU]/mL (ref 0.350–4.500)

## 2021-12-18 LAB — ETHANOL: Alcohol, Ethyl (B): <10 mg/dL (ref <10)

## 2021-12-18 MED ORDER — ALUM & MAG HYDROXIDE-SIMETH 200-200-20 MG/5ML PO SUSP: 30.0000 mL | ORAL | Status: DC | PRN

## 2021-12-18 MED ORDER — HYDROXYZINE HCL 25 MG PO TABS
25.0000 mg | ORAL_TABLET | Freq: Four times a day (QID) | ORAL | Status: AC | PRN
  Administered 2021-12-19 – 2021-12-20 (×2): 25 mg via ORAL
  Filled 2021-12-18 (×2): qty 1

## 2021-12-18 MED ORDER — HYDROCHLOROTHIAZIDE 25 MG PO TABS
25.0000 mg | ORAL_TABLET | Freq: Every day | ORAL | Status: DC
  Administered 2021-12-19 – 2021-12-22 (×4): 25 mg via ORAL
  Filled 2021-12-18 (×2): qty 1
  Filled 2021-12-18: qty 14
  Filled 2021-12-18 (×2): qty 1

## 2021-12-18 MED ORDER — IRBESARTAN 150 MG PO TABS
300.0000 mg | ORAL_TABLET | Freq: Every day | ORAL | Status: DC
  Administered 2021-12-19 – 2021-12-22 (×4): 300 mg via ORAL
  Filled 2021-12-18: qty 28
  Filled 2021-12-18: qty 4
  Filled 2021-12-18: qty 2
  Filled 2021-12-18 (×2): qty 4

## 2021-12-18 MED ORDER — THIAMINE HCL 100 MG/ML IJ SOLN
100.0000 mg | Freq: Once | INTRAMUSCULAR | Status: AC
  Administered 2021-12-18: 100 mg via INTRAMUSCULAR
  Filled 2021-12-18: qty 2

## 2021-12-18 MED ORDER — NITROGLYCERIN 0.4 MG SL SUBL
0.4000 mg | SUBLINGUAL_TABLET | SUBLINGUAL | Status: DC | PRN
  Filled 2021-12-18: qty 25

## 2021-12-18 MED ORDER — ACETAMINOPHEN 325 MG PO TABS: 650.0000 mg | ORAL_TABLET | Freq: Four times a day (QID) | ORAL | Status: DC | PRN

## 2021-12-18 MED ORDER — MAGNESIUM HYDROXIDE 400 MG/5ML PO SUSP
30.0000 mL | Freq: Every day | ORAL | Status: DC | PRN
  Administered 2021-12-22: 30 mL via ORAL
  Filled 2021-12-18: qty 30

## 2021-12-18 MED ORDER — LORAZEPAM 1 MG PO TABS: 1.0000 mg | ORAL_TABLET | Freq: Four times a day (QID) | ORAL | Status: AC | PRN

## 2021-12-18 MED ORDER — ONDANSETRON 4 MG PO TBDP: 4.0000 mg | ORAL_TABLET | Freq: Four times a day (QID) | ORAL | Status: AC | PRN

## 2021-12-18 MED ORDER — LOPERAMIDE HCL 2 MG PO CAPS: 2.0000 mg | ORAL_CAPSULE | ORAL | Status: AC | PRN

## 2021-12-18 MED ORDER — THIAMINE HCL 100 MG PO TABS
100.0000 mg | ORAL_TABLET | Freq: Every day | ORAL | Status: DC
  Administered 2021-12-19 – 2021-12-22 (×4): 100 mg via ORAL
  Filled 2021-12-18 (×4): qty 1

## 2021-12-18 MED ORDER — NICOTINE 21 MG/24HR TD PT24
21.0000 mg | MEDICATED_PATCH | Freq: Every day | TRANSDERMAL | Status: DC
  Administered 2021-12-18 – 2021-12-23 (×6): 21 mg via TRANSDERMAL
  Filled 2021-12-18: qty 1
  Filled 2021-12-18: qty 14
  Filled 2021-12-18 (×5): qty 1

## 2021-12-18 MED ORDER — ADULT MULTIVITAMIN W/MINERALS CH
1.0000 | ORAL_TABLET | Freq: Every day | ORAL | Status: DC
  Administered 2021-12-18 – 2021-12-22 (×5): 1 via ORAL
  Filled 2021-12-18 (×5): qty 1
  Filled 2021-12-18: qty 14

## 2021-12-18 MED ORDER — VALSARTAN-HYDROCHLOROTHIAZIDE 320-25 MG PO TABS: 1.0000 | ORAL_TABLET | Freq: Every day | ORAL | Status: DC

## 2021-12-18 NOTE — ED Notes (Signed)
Pt is in the dinning room watching TV. Respirations are even and unlabored. No acute distress noted. Will continue to monitor for safety. 

## 2021-12-18 NOTE — ED Notes (Incomplete)
Pt

## 2021-12-18 NOTE — ED Notes (Signed)
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  Patient states last use was yesterday.  He was calm, cooperative and agreeable to admission process.  He was oriented to the unit and shown to his room.  Shortly afterward he was given lunch.  He denied avh shi or plan and is without somatic complaint or withdrawal symptoms at this time.  Patient presently resting in his room quietly.

## 2021-12-18 NOTE — ED Notes (Signed)
Pt attending AA group meeting 

## 2021-12-18 NOTE — ED Triage Notes (Signed)
Pt presents to [facility] voluntarily seeking substance use treatment. Pt reports using cocaine and alcohol daily, unknown amount. Pt reports last use of alcohol yesterday, two 40oz beers. Pt reports last use of cocaine 8/12. Pt states "I am tired of using drugs".Pt denies any withdrawal symptoms. Pt denies SI/HI and AVH.

## 2021-12-18 NOTE — Progress Notes (Signed)
Encouraged patient to attend a nursing group although he is not feeling well and declined.

## 2021-12-18 NOTE — ED Provider Notes (Signed)
Facility Based Crisis Admission H&P  Date: 12/18/21 Patient Name: Edward Farmer MRN: [id number] Chief Complaint:  Chief Complaint  Patient presents with   Addiction Problem   Alcohol Problem     Diagnoses:  Final diagnoses:  Polysubstance abuse [facility])   HPI: Pt presents voluntarily to [facility] behavioral health for walk-in assessment. Pt is assessed face-to-face by nurse practitioner.   Edward Farmer, 61 y.o., male patient seen face to face by this provider, consulted with Dr. Dwyane Dee; and chart reviewed on 12/18/21. Per chart review, hx of bradycardia, asthma, coronary artery disease, hypertension, tobacco abuse. On evaluation [name] reports he presenting today because "I'm tired of doing drugs and drinking." He reports he was referred to this facility from the [facility] today. Pt is interested in detox and residential substance use treatment.  Pt states he is using crack/cocaine daily, by smoking. He reports his last use was on Saturday. He reports he has not used since Saturday because he is "broke" and wants to get help. He is unable to estimate amount used. He reports he has used for "at least 10 years".  Pt reports he is using alcohol daily. He reports his last use was last night, had 2 40oz beers. He reports he drinks 3 40oz beers daily and 1/5 of liquor he shares w/ others daily "for years".  Pt reports use of 1 to 1.5 packs of cigarettes/day. He reports he has a cigarette after waking up in the morning. NRT was discussed w/ pt. He declines at this time. He agrees for order to be placed for nicotine patch should he change his mind.  Pt denies use of other substances.   Pt denies SI/VI/HI. He denies AVH, paranoia.  Pt denies hx of NSSI or SA. Pt denies hx of inpatient psychiatric hospitalization.  Pt reports he believes family psychiatric hx may be positive. He states he believes his father carries a psychiatric dx although is not sure.   Pt reports he is currently living  in a boarding house. He states he has been living there since October of last year.   Pt reports he is not currently employed.   Pt reports he currently takes 1 medication daily. He states this medication is for his blood pressure, although he cannot recall the name. He states he last took this medication this morning. He reports he was also given a CPAP about a year ago. He states he does not use this CPAP regularly, it is at his friend's house. He states he has used it an estimated 6 or 7 times since he got it. He states he last used it about a week ago.  During evaluation Edward Farmer is a&ox3, sitting in no acute distress, non-toxic appearing. He appears casually dressed, fairly groomed, appropriate for environment. He makes good eye contact. Speech is clear and coherent w/ nml rate and volume. His reported mood is dysphoric. His affect is blunt. His TP is coherent, goal directed, linear. Description of associations is intact. TC is logical. There is no evidence he is responding to internal stimuli. There is no evidence of agitation, aggression, or distractibility. No delusions or paranoia elicited. Pt is calm, cooperative, pleasant.  PHQ 2-9:  [employer] Visit from 12/29/2019 in [place] Office Visit from 03/12/2018 in [place]  Thoughts that you would be better off dead, or of hurting yourself in some way Not at all Not at all  PHQ-9 Total Score 9 13       Flowsheet Row ED from 12/18/2021 in [facility]  C-SSRS RISK CATEGORY No Risk       Total Time spent with patient: 20 minutes  Musculoskeletal  Strength & Muscle Tone: within normal limits Gait & Station: normal Patient leans: N/A  Psychiatric Specialty Exam  Presentation General Appearance: Appropriate for Environment; Casual; Fairly Groomed  Eye Contact:Good  Speech:Clear and Coherent; Normal Rate  Speech  Volume:Normal  Handedness:No data recorded  Mood and Affect  Mood:Dysphoric  Affect:Blunt   Thought Process  Thought Processes:Coherent; Goal Directed; Linear  Descriptions of Associations:Intact  Orientation:Full (Time, Place and Person)  Thought Content:Logical    Hallucinations:Hallucinations: None  Ideas of Reference:None  Suicidal Thoughts:Suicidal Thoughts: No  Homicidal Thoughts:Homicidal Thoughts: No   Sensorium  Memory:Immediate Good  Judgment:Fair  Insight:Fair   Executive Functions  Concentration:Fair  Attention Span:Fair  Recall:Fair  Fund of Knowledge:Fair  Language:Good   Psychomotor Activity  Psychomotor Activity:Psychomotor Activity: Normal   Assets  Assets:Communication Skills; Desire for Improvement; Financial Resources/Insurance; Housing   Sleep  Sleep:Sleep: Poor Number of Hours of Sleep: 5   Nutritional Assessment (For OBS and FBC admissions only) Has the patient had a weight loss or gain of 10 pounds or more in the last 3 months?: No Has the patient had a decrease in food intake/or appetite?: No Does the patient have dental problems?: No Does the patient have eating habits or behaviors that may be indicators of an eating disorder including binging or inducing vomiting?: No Has the patient recently lost weight without trying?: 0 Has the patient been eating poorly because of a decreased appetite?: 0 Malnutrition Screening Tool Score: 0    Physical Exam Constitutional:      Appearance: Normal appearance.  Cardiovascular:     Rate and Rhythm: Normal rate.  Neurological:     Mental Status: He is alert and oriented to person, place, and time.  Psychiatric:        Attention and Perception: Attention and perception normal.        Speech: Speech normal.        Behavior: Behavior normal. Behavior is cooperative.        Thought Content: Thought content normal.        Cognition and Memory: Cognition normal.    Review of  Systems  Constitutional:  Negative for chills and fever.  Respiratory:  Negative for shortness of breath.   Cardiovascular:  Negative for chest pain and palpitations.  Gastrointestinal:  Negative for abdominal pain.  Neurological:  Negative for dizziness and headaches.  Psychiatric/Behavioral:  Positive for substance abuse.     Blood pressure 127/89, pulse 91, temperature 98.2 F (36.8 C), temperature source Oral, resp. rate 18, SpO2 95 %. There is no height or weight on file to calculate BMI.  Past Psychiatric History: Reported hx of polysubstance abuse   Is the patient at risk to self? No  Has the patient been a risk to self in the past 6 months? No .    Has the patient been a risk to self within the distant past? No   Is the patient a risk to others? No   Has the patient been a risk to others in the past 6 months? No   Has the patient been a risk to others within the distant past? No   Past Medical History:  Past Medical History:  Diagnosis Date   Asthma  Bradycardia 07/14/2016   on BB. Rate improved with discontinuation.    Coronary artery disease    Hypertension    Tobacco abuse     Past Surgical History:  Procedure Laterality Date   CORONARY ARTERY BYPASS GRAFT N/A 07/17/2016   Procedure: CORONARY ARTERY BYPASS GRAFTING (CABG) x 1. OFF PUMP. LIMA TO LAD.;  Surgeon: Delight Ovens, MD;  Location: [facility] OR;  Service: Open Heart Surgery;  Laterality: N/A;   LEFT HEART CATH AND CORONARY ANGIOGRAPHY N/A 07/16/2016   Procedure: Left Heart Cath and Coronary Angiography;  Surgeon: Marykay Lex, MD;  Location: [facility] INVASIVE CV LAB;  Service: Cardiovascular;  Laterality: N/A;   LEFT HEART CATH AND CORONARY ANGIOGRAPHY N/A 04/19/2020   Procedure: LEFT HEART CATH AND CORONARY ANGIOGRAPHY;  Surgeon: Lennette Bihari, MD;  Location: [facility] INVASIVE CV LAB;  Service: Cardiovascular;  Laterality: N/A;   TEE WITHOUT CARDIOVERSION N/A 07/17/2016   Procedure: TRANSESOPHAGEAL ECHOCARDIOGRAM  (TEE);  Surgeon: Delight Ovens, MD;  Location: [facility] OR;  Service: Open Heart Surgery;  Laterality: N/A;    Family History:  Family History  Problem Relation Age of Onset   Migraines Mother    Heart attack Father     Social History:  Social History   Socioeconomic History   Marital status: Legally Separated    Spouse name: Not on file   Number of children: Not on file   Years of education: Not on file   Highest education level: Not on file  Occupational History   Not on file  Tobacco Use   Smoking status: Every Day    Packs/day: 0.25    Types: Cigars, Cigarettes   Smokeless tobacco: Never   Tobacco comments:    cutting back  Vaping Use   Vaping Use: Never used  Substance and Sexual Activity   Alcohol use: Yes    Alcohol/week: 0.0 standard drinks of alcohol    Comment: occ   Drug use: No   Sexual activity: Not on file  Other Topics Concern   Not on file  Social History Narrative   Not on file   Social Determinants of Health   Financial Resource Strain: Not on file  Food Insecurity: Not on file  Transportation Needs: No Transportation Needs (04/25/2020)   PRAPARE - Administrator, Civil Service (Medical): No    Lack of Transportation (Non-Medical): No  Physical Activity: Not on file  Stress: Not on file  Social Connections: Not on file  Intimate Partner Violence: Not on file    SDOH:  SDOH Screenings   Alcohol Screen: Not on file  Depression (PHQ2-9): Low Risk  (01/30/2021)   Depression (PHQ2-9)    PHQ-2 Score: 1  Financial Resource Strain: Not on file  Food Insecurity: Not on file  Housing: Medium Risk (04/25/2020)   Housing    Last Housing Risk Score: 1  Physical Activity: Not on file  Social Connections: Not on file  Stress: Not on file  Tobacco Use: High Risk (05/15/2021)   Patient History    Smoking Tobacco Use: Every Day    Smokeless Tobacco Use: Never    Passive Exposure: Not on file  Transportation Needs: No Transportation  Needs (04/25/2020)   PRAPARE - Transportation    Lack of Transportation (Medical): No    Lack of Transportation (Non-Medical): No    Last Labs:  No visits with results within 6 Month(s) from this visit.  Latest known visit with results is:  Hospital Outpatient  Visit on 01/12/2021  Component Date Value Ref Range Status   Rest HR 01/12/2021 61.0  bpm Final   Rest BP 01/12/2021 121/94  mmHg Final   Peak HR 01/12/2021 83  bpm Final   Peak BP 01/12/2021 128/91  mmHg Final   Rest Nuclear Isotope Dose 01/12/2021 10.9  mCi Final   Stress Nuclear Isotope Dose 01/12/2021 28.7  mCi Final   SSS 01/12/2021 3.0   Final   SRS 01/12/2021 2.0   Final   SDS 01/12/2021 1.0   Final   TID 01/12/2021 1.12   Final   LV sys vol 01/12/2021 60.0  mL Final   LV dias vol 01/12/2021 120.0  62 - 150 mL Final   Nuc Stress EF 01/12/2021 50  % Final   ST Depression (mm) 01/12/2021 0  mm Final    Allergies: Patient has no known allergies.  PTA Medications: (Not in a hospital admission)   Long Term Goals: Improvement in symptoms so as ready for discharge  Short Term Goals: Patient will verbalize feelings in meetings with treatment team members. and Pt will complete the PHQ9 on admission, day 3 and discharge.  Medical Decision Making  Admission to [facility]  Labs: -CBC w/ differential/platelet -CMP -Ethanol -Hemoglobin A1c -Lipid panel -TSH  -EKG 12-Lead  -Resp panel by RT-PCR (Flu A&B, Covid) Anterior Nasal Swab  POC: -POCT Urine Drug Screen - (I-Screen) -POC SARS CORONAVIRUS 2 AG Nasal Swab  Medications: -Tylenol, 650mg , oral, every 6 hours PRN, mild pain -Maalox/Mylanta, 85mL, oral, every 4 hours PRN, indigestion -Atarax, 25mg , oral, every 6 hours PRN, anxiety/agitation or CIWA < or =10 -Imodium, 2-4mg , oral, as needed, diarrhea or loose stools -Ativan, 1 mg, oral, every 6 hours PRN, CIWA >10 -Milk of Magnesia, 11mL, oral, daily PRN, mild constipation -Multivitamin w/ minerals 1 tablet, oral,  daily -Nicoderm CQ 21mg , transdermal, administer over 24 hours, daily -Zofran-ODT, 4mg , oral, every 6 hours PRN, nausea, vomiting -Vitamin B1 100mg , IM, once -Vitamin B1 100mg , oral daily -Nitrostat, 0.4mg , sublingual, every 5 min x3 PRN, chest pain -Diovan-HCT 1 tablet, oral, daily   Recommendations  Based on my evaluation the patient does not appear to have an emergency medical condition.  Tharon Aquas, NP 12/18/21  11:33 AM

## 2021-12-19 ENCOUNTER — Encounter (HOSPITAL_COMMUNITY): Payer: Self-pay | Admitting: Psychiatry

## 2021-12-19 ENCOUNTER — Encounter (HOSPITAL_COMMUNITY): Payer: Self-pay

## 2021-12-19 DIAGNOSIS — I2510 Atherosclerotic heart disease of native coronary artery without angina pectoris: Secondary | ICD-10-CM | POA: Diagnosis not present

## 2021-12-19 DIAGNOSIS — I10 Essential (primary) hypertension: Secondary | ICD-10-CM | POA: Diagnosis not present

## 2021-12-19 DIAGNOSIS — J45909 Unspecified asthma, uncomplicated: Secondary | ICD-10-CM | POA: Diagnosis not present

## 2021-12-19 DIAGNOSIS — F1911 Other psychoactive substance abuse, in remission: Secondary | ICD-10-CM | POA: Diagnosis not present

## 2021-12-19 MED ORDER — CLONIDINE HCL 0.1 MG PO TABS
0.1000 mg | ORAL_TABLET | Freq: Once | ORAL | Status: AC
  Administered 2021-12-19: 0.1 mg via ORAL
  Filled 2021-12-19: qty 1

## 2021-12-19 NOTE — BH IP Treatment Plan (Addendum)
Interdisciplinary Treatment and Diagnostic Plan Update  12/19/2021 Time of Session: 9:45AM  Edward Farmer MRN: [id number]  Diagnosis:  Final diagnoses:  Polysubstance abuse (HCC)     Current Medications:  Current Facility-Administered Medications  Medication Dose Route Frequency Provider Last Rate Last Admin   acetaminophen (TYLENOL) tablet 650 mg  650 mg Oral Q6H PRN Lauree Chandler, NP       alum & mag hydroxide-simeth (MAALOX/MYLANTA) 200-200-20 MG/5ML suspension 30 mL  30 mL Oral Q4H PRN Lauree Chandler, NP       irbesartan (AVAPRO) tablet 300 mg  300 mg Oral Daily Nelly Rout, MD       And   hydrochlorothiazide (HYDRODIURIL) tablet 25 mg  25 mg Oral Daily Nelly Rout, MD       hydrOXYzine (ATARAX) tablet 25 mg  25 mg Oral Q6H PRN Lauree Chandler, NP       loperamide (IMODIUM) capsule 2-4 mg  2-4 mg Oral PRN Lauree Chandler, NP       LORazepam (ATIVAN) tablet 1 mg  1 mg Oral Q6H PRN Lauree Chandler, NP       magnesium hydroxide (MILK OF MAGNESIA) suspension 30 mL  30 mL Oral Daily PRN Lauree Chandler, NP       multivitamin with minerals tablet 1 tablet  1 tablet Oral Daily Lauree Chandler, NP   1 tablet at 12/18/21 1151   nicotine (NICODERM CQ - dosed in mg/24 hours) patch 21 mg  21 mg Transdermal Q0600 Lauree Chandler, NP   21 mg at 12/19/21 0530   nitroGLYCERIN (NITROSTAT) SL tablet 0.4 mg  0.4 mg Sublingual Q5 Min x 3 PRN Lauree Chandler, NP       ondansetron (ZOFRAN-ODT) disintegrating tablet 4 mg  4 mg Oral Q6H PRN Lauree Chandler, NP       thiamine (VITAMIN B1) tablet 100 mg  100 mg Oral Daily Lauree Chandler, NP       Current Outpatient Medications  Medication Sig Dispense Refill   nitroGLYCERIN (NITROSTAT) 0.4 MG SL tablet Place 1 tablet (0.4 mg total) under the tongue every 5 (five) minutes x 3 doses as needed for chest pain. 25 tablet 3   valsartan-hydrochlorothiazide (DIOVAN-HCT) 320-25 MG tablet Take 1 tablet by mouth  daily. 60 tablet 3   PTA Medications: Prior to Admission medications   Medication Sig Start Date End Date Taking? Authorizing Provider  nitroGLYCERIN (NITROSTAT) 0.4 MG SL tablet Place 1 tablet (0.4 mg total) under the tongue every 5 (five) minutes x 3 doses as needed for chest pain. 12/13/20  Yes [name], MD  valsartan-hydrochlorothiazide (DIOVAN-HCT) 320-25 MG tablet Take 1 tablet by mouth daily. 05/15/21  Yes [name], MD    Patient Stressors: Financial difficulties   Medication change or noncompliance   Substance abuse    Patient Strengths: Motivation for treatment/growth   Treatment Modalities: Medication Management, Group therapy, Case management,  1 to 1 session with clinician, Psychoeducation, Recreational therapy.   Physician Treatment Plan for Primary and Secondary Diagnosis:  Final diagnoses:  Polysubstance abuse (HCC)   Long Term Goal(s): Improvement in symptoms so as ready for discharge  Short Term Goals: Patient will verbalize feelings in meetings with treatment team members. Pt will complete the PHQ9 on admission, day 3 and discharge.  Medication Management: Evaluate patient's response, side effects, and tolerance of medication regimen.  Therapeutic Interventions: 1 to 1 sessions, Unit Group sessions and Medication administration.  Evaluation of Outcomes: Adequate for Discharge  LCSW Treatment Plan for Primary Diagnosis:  Final diagnoses:  Polysubstance abuse (HCC)    Long Term Goal(s): Safe transition to appropriate next level of care at discharge.  Short Term Goals: Facilitate acceptance of mental health diagnosis and concerns through verbal commitment to aftercare plan and appointments at discharge. and Identify minimum of 2 triggers associated with mental health/substance abuse issues with treatment team members.  Therapeutic Interventions: Assess for all discharge needs, 1 to 1 time with Child psychotherapist, Explore available resources and  support systems, Assess for adequacy in community support network, Educate family and significant other(s) on suicide prevention, Complete Psychosocial Assessment, Interpersonal group therapy.  Evaluation of Outcomes: Adequate for Discharge   Progress in Treatment: Attending groups: Yes. Participating in groups: Yes. Taking medication as prescribed: Yes. Toleration medication: Yes. Family/Significant other contact made: No, will contact:  no one at this time Patient understands diagnosis: Yes. Discussing patient identified problems/goals with staff: Yes. Medical problems stabilized or resolved: Yes. Denies suicidal/homicidal ideation: Yes. Issues/concerns per patient self-inventory: No. Other: None   New problem(s) identified: No, Describe:  None   New Short Term/Long Term Goal(s): [name] shared that his long term goal is to have his grandchildren back in his life. He reports that his relationships with his family has become strained due to his substance use issues.   Patient Goals:  ""To get sober"   Discharge Plan or Barriers: [name] will discharge to [facility] on Monday, 12/24/21 by 9:00AM.   Reason for Continuation of Hospitalization: Medication stabilization  Estimated Length of Stay: Monday, 12/24/21  Last 3 [place] Suicide Severity Risk Score: Flowsheet Row ED from 12/18/2021 in [facility]  C-SSRS RISK CATEGORY No Risk       Last PHQ 2/9 Scores:    01/30/2021    4:47 PM 12/29/2019    9:08 AM 03/12/2018    9:48 AM  Depression screen PHQ 2/9  Decreased Interest 1 1 2   Down, Depressed, Hopeless 0 0 1  PHQ - 2 Score 1 1 3   Altered sleeping  2 2  Tired, decreased energy  2 3  Change in appetite  2 3  Feeling bad or failure about yourself   0 1  Trouble concentrating  1 1  Moving slowly or fidgety/restless  1   Suicidal thoughts  0 0  PHQ-9 Score  9 13    Scribe for Treatment Team: ,  LCSW 12/19/2021 9:05 AM

## 2021-12-19 NOTE — ED Notes (Signed)
Patient A&Ox4. Denies intent to harm self/others when asked. Denies A/VH. Patient denies any physical complaints when asked. No acute distress noted. Pt states, "I've got to do better. I'm tired of drinking. It only make you forget your problems for a little while. At first, it was fun to drink, then it became a necessity to drink to keep myself from feeling sick. I just want to feel good all the time. I need to go somewhere far from here. I am suppose to go to [facility] but I probably know everybody there. I either sold drugs to them or used drugs with them. Neither one is good for me and my goals. I would like to talk with my social worker to find me somewhere else to go". Support and encouragement provided. Routine safety checks conducted according to facility protocol. Encouraged patient to notify staff if thoughts of harm toward self or others arise. Patient verbalize understanding and agreement. Will continue to monitor for safety.

## 2021-12-19 NOTE — ED Notes (Signed)
Pt sleeping in no acute distress. RR even and unlabored. Safety maintained. 

## 2021-12-19 NOTE — ED Notes (Addendum)
Pt BP was elevated  BP 146/116, when morning vital was taken. Provider notified.

## 2021-12-19 NOTE — Clinical Social Work Psych Note (Signed)
LCSW Update   LCSW met with [name] for introduction and to begin discussions regarding treatment and potential discharge planning.  [name] presented with a dysphoric affect, depressed mood, however he was pleasant and cooperative with this Probation officer.   [name] denied having any SI, HI or AVH at this time. [name] denied having any physical complaints at this time.  [name] shared that he presented to the [facility] seeking assistance for ongoing issues with substance abuse and requesting substance abuse detox and treatment services.   According to [facility] Admission H&P note," [name], 61 y.o., male patient seen face to face by this provider, consulted with Dr. Dwyane Dee; and chart reviewed on 12/18/21. Per chart review, hx of bradycardia, asthma, coronary artery disease, hypertension, tobacco abuse. On evaluation [name] reports he presenting today because "I'm tired of doing drugs and drinking." He reports he was referred to this facility from the [facility] today. Pt is interested in detox and residential substance use treatment.Pt states he is using crack/cocaine daily, by smoking. He reports his last use was on Saturday. He reports he has not used since Saturday because he is "broke" and wants to get help. He is unable to estimate amount used. He reports he has used for "at least 10 years". Pt reports he is using alcohol daily. He reports his last use was last night, had 2 40oz beers. He reports he drinks 3 40oz beers daily and 1/5 of liquor he shares w/ others daily "for years".Pt reports use of 1 to 1.5 packs of cigarettes/day. He reports he has a cigarette after waking up in the morning. NRT was discussed w/ pt. He declines at this time. He agrees for order to be placed for nicotine patch should he change his mind".  [name] shared with LCSW that he is "too old to keep going how I was with the drugs". [name] shared that a long term goal for him is to rekindle the relationship he has with  his grandchildren, which has become distant due to his substance abuse issues.   [name] requested to go to  [employer], so that he can complete the short-term programming in order to better qualify for a long-term program.   [name] denied having any additional questions or concerns at this time.   LCSW referred [name] to [facility] for review. LCSW will continue to follow for possible placement.    Radonna Ricker, MSW, LCSW Clinical Education officer, museum ([facility]

## 2021-12-19 NOTE — ED Provider Notes (Signed)
FBC Progress Note  Date and Time: 12/19/2021 10:42 AM Name: Edward Farmer MRN:  [id number]  Reason For Admission: Alcohol detox and substance rehab treatment  Subjective: Patient seen and assessed at bedside.  Patient reports that he is not experiencing much alcohol withdrawal symptoms.  Patient denies SI/HI/AVH.  Patient reports that he is interested in residential substance rehab treatment and eventually wants to be outside of [place] area as there are many triggers for him to drink and use substances in the area.  Discussed is trying to get him into [facility] from here as this is the natural progression and patient was amenable to this.  Reports alcohol withdrawal symptoms are minimal beyond some minor tremors.  No further questions at this time.  Discussed we would talk about medication adjustments tomorrow including starting gabapentin/naltrexone or adding another blood pressure medication.  Patient was amenable to this and had no further questions at this time.  Diagnosis:  Final diagnoses:  Polysubstance abuse (HCC)    Total Time spent with patient: 45 minutes   Labs  Lab Results:     Latest Ref Rng & Units 12/18/2021   11:43 AM 04/20/2020    2:24 AM 04/19/2020    8:26 AM  CBC  WBC 4.0 - 10.5 K/uL 6.8  8.3  8.8   Hemoglobin 13.0 - 17.0 g/dL 80.9  98.3  38.2   Hematocrit 39.0 - 52.0 % 41.5  42.4  46.2   Platelets 150 - 400 K/uL 194  204  210       Latest Ref Rng & Units 12/18/2021   11:43 AM 01/02/2021    8:47 AM 04/21/2020    3:07 AM  CMP  Glucose 70 - 99 mg/dL 95  89  505   BUN 8 - 23 mg/dL 15  13  13    Creatinine 0.61 - 1.24 mg/dL  3.97  6.73   Sodium 135 - 145 mmol/L 139  138  136   Potassium 3.5 - 5.1 mmol/L 3.8  4.4  4.0   Chloride 98 - 111 mmol/L 107  102  103   CO2 22 - 32 mmol/L 25  24  21    Calcium 8.9 - 10.3 mg/dL 9.3  9.5  9.0   Total Protein 6.5 - 8.1 g/dL 7.5  6.8    Total Bilirubin 0.3 - 1.2 mg/dL 0.4  4.19    Alkaline Phos 38 - 126 U/L 89  101     AST 15 - 41 U/L 17  14    ALT 0 - 44 U/L 17  19      Physical Findings   GAD-7    Flowsheet Row Office Visit from 12/29/2019 in [facility] Office Visit from 03/12/2018 in [facility]  Total GAD-7 Score 9 12      PHQ2-9    Flowsheet Row Office Visit from 01/30/2021 in [facility] Office Visit from 12/29/2019 in [facility] Office Visit from 03/12/2018 in [facility] Office Visit from 05/25/2014 in [facility] for Infectious Disease Office Visit from 04/06/2014 in [facility] for Infectious Disease  PHQ-2 Total Score 1 1 3  0 0  PHQ-9 Total Score -- 9 13 -- --      Flowsheet Row ED from 12/18/2021 in [facility]  C-SSRS RISK CATEGORY No Risk  Musculoskeletal  Strength & Muscle Tone: within normal limits Gait & Station: normal Patient leans: N/A  Psychiatric Specialty Exam  Presentation  General Appearance: Appropriate for Environment; Casual; Fairly Groomed   Eye Contact:Good   Speech:Clear and Coherent; Normal Rate   Speech Volume:Normal   Handedness:No data recorded   Mood and Affect  Mood:Dysphoric   Affect:Blunt    Thought Process  Thought Processes:Coherent; Goal Directed; Linear   Descriptions of Associations:Intact   Orientation:Full (Time, Place and Person)   Thought Content:Logical   Diagnosis of Schizophrenia or Schizoaffective disorder in past: No     Hallucinations:Hallucinations: None   Ideas of Reference:None   Suicidal Thoughts:Suicidal Thoughts: No   Homicidal Thoughts:Homicidal Thoughts: No    Sensorium  Memory:Immediate Good   Judgment:Fair   Insight:Fair    Executive Functions  Concentration:Fair   Attention Span:Fair   Recall:Fair   Fund of  Knowledge:Fair   Language:Good    Psychomotor Activity  Psychomotor Activity:Psychomotor Activity: Normal    Assets  Assets:Communication Skills; Desire for Improvement; Financial Resources/Insurance; Housing    Sleep  Sleep:Sleep: Poor Number of Hours of Sleep: 5    Physical Exam  Physical Exam Vitals and nursing note reviewed.  Constitutional:      General: He is not in acute distress.    Appearance: He is well-developed.  HENT:     Head: Normocephalic and atraumatic.  Eyes:     Conjunctiva/sclera: Conjunctivae normal.  Cardiovascular:     Rate and Rhythm: Normal rate and regular rhythm.     Heart sounds: No murmur heard. Pulmonary:     Effort: Pulmonary effort is normal. No respiratory distress.     Breath sounds: Normal breath sounds.  Abdominal:     Palpations: Abdomen is soft.     Tenderness: There is no abdominal tenderness.  Musculoskeletal:        General: No swelling.     Cervical back: Neck supple.  Skin:    General: Skin is warm and dry.     Capillary Refill: Capillary refill takes less than 2 seconds.  Neurological:     Mental Status: He is alert.  Psychiatric:        Mood and Affect: Mood normal.    Review of Systems  Respiratory:  Negative for shortness of breath.   Cardiovascular:  Negative for chest pain.  Gastrointestinal:  Negative for abdominal pain, constipation, diarrhea, heartburn, nausea and vomiting.  Neurological:  Negative for headaches.   Blood pressure (!) 149/116, pulse 77, temperature 98.3 F (36.8 C), temperature source Oral, resp. rate 20, SpO2 98 %. There is no height or weight on file to calculate BMI.  ASSESSMENT Edward Farmer is a 61 y.o. male with PMHx of nicotine use disorder, hypertension, CAD, asthma, bradycardia, cocaine use disorder, alcohol use disorder presenting to [facility] on 12/18/2021 for alcohol detox and residential substance rehab placement.   PLAN Alcohol use disorder Cocaine use disorder -Continue  CIWA to monitor for alcohol withdrawal -MVI/thiamine  Nicotine use disorder -Continue NRT with nicotine patch  History of CAD status post CABG Hypercholesterolemia -Nitroglycerin as needed for chest pain  Hypertension -Continue formulary alternative irbesartan 300 mg plus HCTZ 25 mg daily  Dispo: LCSW to assist with possible [facility] residential   Park Pope, MD 12/19/2021 10:42 AM

## 2021-12-19 NOTE — ED Notes (Signed)
Pt is in the bed sleeping. Respirations are even and unlabored. No acute distress noted. Will continue to monitor for safety. 

## 2021-12-19 NOTE — ED Notes (Signed)
Patient A&Ox4. Denies intent to harm self/others when asked. Denies A/VH. Patient denies any physical complaints when asked. No acute distress noted. Routine safety checks conducted according to facility protocol. Encouraged patient to notify staff if thoughts of harm toward self or others arise. Patient verbalize understanding and agreement. Will continue to monitor for safety.    

## 2021-12-19 NOTE — ED Notes (Signed)
Pt sitting in dayroom interacting well with peers and watching tv. Denies concerns. Informed pt to notify staff with any needs or concerns. Will continue to monitor for safety.

## 2021-12-20 DIAGNOSIS — I10 Essential (primary) hypertension: Secondary | ICD-10-CM | POA: Diagnosis not present

## 2021-12-20 DIAGNOSIS — J45909 Unspecified asthma, uncomplicated: Secondary | ICD-10-CM | POA: Diagnosis not present

## 2021-12-20 DIAGNOSIS — F1911 Other psychoactive substance abuse, in remission: Secondary | ICD-10-CM | POA: Diagnosis not present

## 2021-12-20 DIAGNOSIS — I2510 Atherosclerotic heart disease of native coronary artery without angina pectoris: Secondary | ICD-10-CM | POA: Diagnosis not present

## 2021-12-20 MED ORDER — AMLODIPINE BESYLATE 5 MG PO TABS
5.0000 mg | ORAL_TABLET | Freq: Every day | ORAL | Status: DC
  Administered 2021-12-20 – 2021-12-22 (×3): 5 mg via ORAL
  Filled 2021-12-20 (×3): qty 1
  Filled 2021-12-20: qty 14

## 2021-12-20 MED ORDER — TRAZODONE HCL 50 MG PO TABS
50.0000 mg | ORAL_TABLET | Freq: Every day | ORAL | Status: DC
  Administered 2021-12-20 – 2021-12-22 (×3): 50 mg via ORAL
  Filled 2021-12-20 (×3): qty 1
  Filled 2021-12-20: qty 14

## 2021-12-20 NOTE — Progress Notes (Signed)
Patient sitting in dayroom with peers watching tv.  No distress.

## 2021-12-20 NOTE — ED Notes (Signed)
At the courtyard with nurse

## 2021-12-20 NOTE — ED Provider Notes (Addendum)
FBC Progress Note  Date and Time: 12/20/2021 10:11 AM Name: Edward Farmer MRN:  [id number]  Reason For Admission: Alcohol detox and substance rehab treatment  Subjective: Patient seen and assessed at bedside.  Patient reports that he is not experiencing much alcohol withdrawal symptoms.  Patient denies SI/HI/AVH.  Patient amenable to the going to [facility] on Monday but ultimately is wanting to get out of [facility] to be away from influences that would trigger him to drink.  Patient's blood pressure still mildly elevated the patient was amenable to starting amlodipine to better control it.  Patient denies any alcohol craving and denies any significant alcohol withdrawal symptoms.  Does report some intermittent problems with sleep so was amenable to starting trazodone 50 mg nightly.  Diagnosis:  Final diagnoses:  Polysubstance abuse (HCC)    Total Time spent with patient: 45 minutes   Labs  Lab Results:     Latest Ref Rng & Units 12/18/2021   11:43 AM 04/20/2020    2:24 AM 04/19/2020    8:26 AM  CBC  WBC 4.0 - 10.5 K/uL 6.8  8.3  8.8   Hemoglobin 13.0 - 17.0 g/dL 66.0  63.0  16.0   Hematocrit 39.0 - 52.0 % 41.5  42.4  46.2   Platelets 150 - 400 K/uL 194  204  210       Latest Ref Rng & Units 12/18/2021   11:43 AM 01/02/2021    8:47 AM 04/21/2020    3:07 AM  CMP  Glucose 70 - 99 mg/dL 95  89  109   BUN 8 - 23 mg/dL 15  13  13    Creatinine 0.61 - 1.24 mg/dL  3.23  5.57   Sodium 135 - 145 mmol/L 139  138  136   Potassium 3.5 - 5.1 mmol/L 3.8  4.4  4.0   Chloride 98 - 111 mmol/L 107  102  103   CO2 22 - 32 mmol/L 25  24  21    Calcium 8.9 - 10.3 mg/dL 9.3  9.5  9.0   Total Protein 6.5 - 8.1 g/dL 7.5  6.8    Total Bilirubin 0.3 - 1.2 mg/dL 0.4  3.22    Alkaline Phos 38 - 126 U/L 89  101    AST 15 - 41 U/L 17  14    ALT 0 - 44 U/L 17  19      Physical Findings   GAD-7    Flowsheet Row Office Visit from 12/29/2019 in [facility] Office Visit  from 03/12/2018 in [facility]  Total GAD-7 Score 9 12      PHQ2-9    Flowsheet Row ED from 12/18/2021 in [facility] Office Visit from 01/30/2021 in [facility] Office Visit from 12/29/2019 in [facility] Office Visit from 03/12/2018 in [facility] Office Visit from 05/25/2014 in [facility] Infectious Disease  PHQ-2 Total Score 1 1 1 3  0  PHQ-9 Total Score -- -- 9 13 --      Flowsheet Row ED from 12/18/2021 in [facility]  C-SSRS RISK CATEGORY No Risk        Musculoskeletal  Strength & Muscle Tone: within normal limits Gait & Station: normal Patient leans: N/A  Psychiatric Specialty Exam  Presentation  General Appearance: Appropriate for Environment; Casual   Eye Contact:Good  Speech:Clear and Coherent; Normal Rate   Speech Volume:Normal   Handedness:No data recorded   Mood and Affect  Mood:Euthymic   Affect:Congruent    Thought Process  Thought Processes:Coherent; Goal Directed; Linear   Descriptions of Associations:Intact   Orientation:Full (Time, Place and Person)   Thought Content:Logical   Diagnosis of Schizophrenia or Schizoaffective disorder in past: No     Hallucinations:Hallucinations: None   Ideas of Reference:None   Suicidal Thoughts:Suicidal Thoughts: No   Homicidal Thoughts:Homicidal Thoughts: No    Sensorium  Memory:Immediate Good; Recent Good; Remote Good   Judgment:Fair   Insight:Fair    Executive Functions  Concentration:Fair   Attention Span:Fair   Recall:Fair   Fund of Knowledge:Fair   Language:Good    Psychomotor Activity  Psychomotor Activity:Psychomotor Activity: Normal    Assets  Assets:Communication Skills; Desire for Improvement; Financial Resources/Insurance; Housing    Sleep   Sleep:Sleep: Fair    Physical Exam  Physical Exam Vitals and nursing note reviewed.  Constitutional:      General: He is not in acute distress.    Appearance: He is well-developed.  HENT:     Head: Normocephalic and atraumatic.  Eyes:     Conjunctiva/sclera: Conjunctivae normal.  Cardiovascular:     Rate and Rhythm: Normal rate and regular rhythm.     Heart sounds: No murmur heard. Pulmonary:     Effort: Pulmonary effort is normal. No respiratory distress.     Breath sounds: Normal breath sounds.  Abdominal:     Palpations: Abdomen is soft.     Tenderness: There is no abdominal tenderness.  Musculoskeletal:        General: No swelling.     Cervical back: Neck supple.  Skin:    General: Skin is warm and dry.     Capillary Refill: Capillary refill takes less than 2 seconds.  Neurological:     Mental Status: He is alert.  Psychiatric:        Mood and Affect: Mood normal.    Review of Systems  Respiratory:  Negative for shortness of breath.   Cardiovascular:  Negative for chest pain.  Gastrointestinal:  Negative for abdominal pain, constipation, diarrhea, heartburn, nausea and vomiting.  Neurological:  Negative for headaches.   Blood pressure (!) 140/90, pulse 70, temperature 97.9 F (36.6 C), temperature source Tympanic, resp. rate 18, SpO2 95 %. There is no height or weight on file to calculate BMI.  ASSESSMENT Edward Farmer is a 61 y.o. male with PMHx of nicotine use disorder, hypertension, CAD, asthma, bradycardia, cocaine use disorder, alcohol use disorder presenting to [facility] on 12/18/2021 for alcohol detox and residential substance rehab placement.   PLAN Alcohol use disorder Cocaine use disorder -Continue CIWA to monitor for alcohol withdrawal -MVI/thiamine -Start trazodone 50 mg nightly for insomnia  Nicotine use disorder -Continue NRT with nicotine patch  History of CAD status post CABG Hypercholesterolemia -Nitroglycerin as needed for chest  pain  Hypertension -Continue formulary alternative irbesartan 300 mg plus HCTZ 25 mg daily -Start amlodipine 5 mg daily for refractory hypertension  Dispo: [facility] 12/24/2021   Park Pope, MD 12/20/2021 10:11 AM

## 2021-12-20 NOTE — ED Notes (Signed)
Pt is complaining of dizziness and shortness of breath. Denies Chest pain and nausea. BP is 154/82, P 82, res 18, O2 is 100.  Provider notified.

## 2021-12-20 NOTE — Progress Notes (Signed)
Patient vomited undigested food x1.  He reports overeating at dinner and is feeling better now.  Will monitor.

## 2021-12-20 NOTE — ED Notes (Signed)
Patient was awake for breakfast and received medication.  He is calm, quiet and pleasant on approach.  Patient makes needs known and is stable on unit.  No evidence of withdrawal, cooperative with care and no somatic distress.  Will encourage him to seek out staff when needed.  Will monitor.

## 2021-12-20 NOTE — ED Notes (Signed)
Pt having dinner: cheeseburger, salad, fruit cup and juice 

## 2021-12-20 NOTE — Clinical Social Work Psych Note (Signed)
LCSW Update Note   Edward Farmer presented with a dysphoric affect, congruent mood. He denied having any SI, HI or AVH at this time.   Edward Farmer has been accepted for a screening for admission appointment on Monday, 12/20/21 by 9:00AM at [facility]   Edward Farmer expressed gratitude, however he also shared that he had some concerns wit going to [facility] program. Edward Farmer shared "I really want to get far way from [place]. I have problems with people, and I don't do well here".   Edward Farmer provided a list of possible alternatives to follow up with regarding placement. The list consisted of:  [facility] - does not accept [place] Medicaid for residential treatment; Detox services only [facility] ([place], [place]) - does not accept [place] Medicaid for treatment and/or detox services [address] ([place], [place]) - does not accept [place] Medicaid for treatment and/or detox services.   LCSW explained to Edward Farmer that due to him having [facility] Medicaid (standard plan), he would only qualify for review at facilities who accept his insurance plan, otherwise he would have to pay "out of pocket. Edward Farmer expressed understanding, and asked if there were any alternatives identified to explore possible placement.   LCSW also contacted the following facilities for possible bed availability:   [facility] - no beds at this time [facility] - no beds at this time [facility] - no answer, left [facility] - no beds at this time [facility] - no beds at this time  LCSW updated Edward Farmer on the search and at this time, the patient's discharge plan is to go to his screening appointment at [facility] on Monday, 12/24/21 by 9:00AM  LCSW will continue to follow.    Baldo Daub, MSW, LCSW Clinical Child psychotherapist (Facility Based Crisis) [facility]

## 2021-12-20 NOTE — ED Notes (Signed)
Pt is in the bed sleeping. Respirations are even an unlabored. No acute distress noted. Will continue to monitor for safety.

## 2021-12-20 NOTE — ED Notes (Signed)
Snacks given 

## 2021-12-20 NOTE — ED Notes (Signed)
In group meeting with the [place] 

## 2021-12-20 NOTE — ED Notes (Signed)
Received patient this PM. Patient is sleeping in his bed. Patient respirations are even and unlabored. Will continue to monitor for safety.  

## 2021-12-20 NOTE — Progress Notes (Signed)
Patient attended a recovery group run by RN out in the courtyard.  Patient participated well and appears motivated for treatment.  Demonstrates fair insight into illness.  Patient given AA recovery materials and same reviewed with him.  He is now sitting in dayroom reviewing those materials.

## 2021-12-21 DIAGNOSIS — F1911 Other psychoactive substance abuse, in remission: Secondary | ICD-10-CM | POA: Diagnosis not present

## 2021-12-21 DIAGNOSIS — I2510 Atherosclerotic heart disease of native coronary artery without angina pectoris: Secondary | ICD-10-CM | POA: Diagnosis not present

## 2021-12-21 DIAGNOSIS — J45909 Unspecified asthma, uncomplicated: Secondary | ICD-10-CM | POA: Diagnosis not present

## 2021-12-21 DIAGNOSIS — I10 Essential (primary) hypertension: Secondary | ICD-10-CM | POA: Diagnosis not present

## 2021-12-21 MED ORDER — ADULT MULTIVITAMIN W/MINERALS CH: 1.0000 | ORAL_TABLET | Freq: Every day | ORAL | 0 refills | Status: AC

## 2021-12-21 MED ORDER — TRAZODONE HCL 50 MG PO TABS: 50.0000 mg | ORAL_TABLET | Freq: Every day | ORAL | 0 refills | Status: DC

## 2021-12-21 MED ORDER — VALSARTAN-HYDROCHLOROTHIAZIDE 320-25 MG PO TABS: 1.0000 | ORAL_TABLET | Freq: Every day | ORAL | 0 refills | Status: DC

## 2021-12-21 MED ORDER — AMLODIPINE BESYLATE 5 MG PO TABS: 5.0000 mg | ORAL_TABLET | Freq: Every day | ORAL | 0 refills | Status: DC

## 2021-12-21 MED ORDER — HYDROCHLOROTHIAZIDE 25 MG PO TABS: 25.0000 mg | ORAL_TABLET | Freq: Every day | ORAL | 0 refills | Status: DC

## 2021-12-21 MED ORDER — NICOTINE 21 MG/24HR TD PT24: 21.0000 mg | MEDICATED_PATCH | Freq: Every day | TRANSDERMAL | 0 refills | Status: AC

## 2021-12-21 MED ORDER — IRBESARTAN 300 MG PO TABS: 300.0000 mg | ORAL_TABLET | Freq: Every day | ORAL | 0 refills | Status: DC

## 2021-12-21 NOTE — ED Notes (Signed)
Patient awake and alert on unit.  He is pleasant, organized, logical and can make needs known.  Patient is without withdrawal symptoms and is tolerating medication well.  Will monitor and provide a safe environment.

## 2021-12-21 NOTE — ED Notes (Signed)
Pt in group meeting

## 2021-12-21 NOTE — ED Notes (Signed)
Snacks given 

## 2021-12-21 NOTE — ED Notes (Signed)
Received patient this PM. Patient in day room watching TV. Patient respirations are even and unlabored. Will continue to monitor for safety.  

## 2021-12-21 NOTE — Discharge Instructions (Signed)

## 2021-12-21 NOTE — ED Notes (Signed)
watching group meeting videos 

## 2021-12-21 NOTE — Progress Notes (Signed)
Patient watched a recovery video and participated in discussion afterwards.  Group run by [employer].

## 2021-12-21 NOTE — ED Provider Notes (Signed)
[facility]BC Progress Note  Date and Time: 12/21/2021 11:38 AM Name: Edward Farmer MRN:  [id number]  Reason For Admission: Alcohol detox and substance rehab treatment  Subjective: Patient seen and assessed at bedside.  Patient reports that he is not experiencing much alcohol withdrawal symptoms.  Patient denies SI/HI/AVH.  Patient amenable to the going to [facility] on Monday but ultimately is wanting to get out of [facility] to be away from influences that would trigger him to drink.  Patient's blood pressure still mildly elevated the patient was amenable to starting amlodipine to better control it.  Patient reports no alcohol craving for alcohol withdrawal symptoms.  Patient reports sleeping well with scheduled trazodone.  Diagnosis:  Final diagnoses:  Polysubstance abuse (HCC)    Total Time spent with patient: 45 minutes   Labs  Lab Results:     Latest Ref Rng & Units 12/18/2021   11:43 AM 04/20/2020    2:24 AM 04/19/2020    8:26 AM  CBC  WBC 4.0 - 10.5 K/uL 6.8  8.3  8.8   Hemoglobin 13.0 - 17.0 g/dL 60.6  30.1  60.1   Hematocrit 39.0 - 52.0 % 41.5  42.4  46.2   Platelets 150 - 400 K/uL 194  204  210       Latest Ref Rng & Units 12/18/2021   11:43 AM 01/02/2021    8:47 AM 04/21/2020    3:07 AM  CMP  Glucose 70 - 99 mg/dL 95  89  093   BUN 8 - 23 mg/dL 15  13  13    Creatinine 0.61 - 1.24 mg/dL  2.35  5.73   Sodium 135 - 145 mmol/L 139  138  136   Potassium 3.5 - 5.1 mmol/L 3.8  4.4  4.0   Chloride 98 - 111 mmol/L 107  102  103   CO2 22 - 32 mmol/L 25  24  21    Calcium 8.9 - 10.3 mg/dL 9.3  9.5  9.0   Total Protein 6.5 - 8.1 g/dL 7.5  6.8    Total Bilirubin 0.3 - 1.2 mg/dL 0.4  2.20    Alkaline Phos 38 - 126 U/L 89  101    AST 15 - 41 U/L 17  14    ALT 0 - 44 U/L 17  19      Physical Findings   GAD-7    Flowsheet Row Office Visit from 12/29/2019 in [facility] Office Visit from 03/12/2018 in [facility]  Total  GAD-7 Score 9 12      PHQ2-9    Flowsheet Row ED from 12/18/2021 in [facility] Office Visit from 01/30/2021 in [facility] Office Visit from 12/29/2019 in [facility] Office Visit from 03/12/2018 in [facility] Office Visit from 05/25/2014 in [facility]  PHQ-2 Total Score 1 1 1 3  0  PHQ-9 Total Score -- -- 9 13 --      Flowsheet Row ED from 12/18/2021 in [facility]  C-SSRS RISK CATEGORY No Risk        Musculoskeletal  Strength & Muscle Tone: within normal limits Gait & Station: normal Patient leans: N/A  Psychiatric Specialty Exam  Presentation  General Appearance: Appropriate for Environment; Casual   Eye Contact:Good   Speech:Clear and Coherent; Normal Rate   Speech Volume:Normal  Handedness:No data recorded   Mood and Affect  Mood:Euthymic   Affect:Congruent    Thought Process  Thought Processes:Coherent; Goal Directed; Linear   Descriptions of Associations:Intact   Orientation:Full (Time, Place and Person)   Thought Content:Logical   Diagnosis of Schizophrenia or Schizoaffective disorder in past: No     Hallucinations:Hallucinations: None   Ideas of Reference:None   Suicidal Thoughts:Suicidal Thoughts: No   Homicidal Thoughts:Homicidal Thoughts: No    Sensorium  Memory:Immediate Good; Recent Good; Remote Good   Judgment:Fair   Insight:Fair    Executive Functions  Concentration:Fair   Attention Span:Fair   Recall:Fair   Fund of Knowledge:Fair   Language:Good    Psychomotor Activity  Psychomotor Activity:Psychomotor Activity: Normal    Assets  Assets:Communication Skills; Desire for Improvement; Financial Resources/Insurance; Housing    Sleep  Sleep:Sleep: Fair    Physical Exam  Physical Exam Vitals and nursing  note reviewed.  Constitutional:      General: He is not in acute distress.    Appearance: He is well-developed.  HENT:     Head: Normocephalic and atraumatic.  Eyes:     Conjunctiva/sclera: Conjunctivae normal.  Cardiovascular:     Rate and Rhythm: Normal rate and regular rhythm.     Heart sounds: No murmur heard. Pulmonary:     Effort: Pulmonary effort is normal. No respiratory distress.     Breath sounds: Normal breath sounds.  Abdominal:     Palpations: Abdomen is soft.     Tenderness: There is no abdominal tenderness.  Musculoskeletal:        General: No swelling.     Cervical back: Neck supple.  Skin:    General: Skin is warm and dry.     Capillary Refill: Capillary refill takes less than 2 seconds.  Neurological:     Mental Status: He is alert.  Psychiatric:        Mood and Affect: Mood normal.    Review of Systems  Respiratory:  Negative for shortness of breath.   Cardiovascular:  Negative for chest pain.  Gastrointestinal:  Negative for abdominal pain, constipation, diarrhea, heartburn, nausea and vomiting.  Neurological:  Negative for headaches.   Blood pressure (!) 142/95, pulse 73, temperature 98.3 F (36.8 C), temperature source Tympanic, resp. rate 18, SpO2 99 %. There is no height or weight on file to calculate BMI.  ASSESSMENT Edward Farmer is a 61 y.o. male with PMHx of nicotine use disorder, hypertension, CAD, asthma, bradycardia, cocaine use disorder, alcohol use disorder presenting to [facility] on 12/18/2021 for alcohol detox and residential substance rehab placement.   PLAN Alcohol use disorder Cocaine use disorder -Continue CIWA to monitor for alcohol withdrawal -MVI/thiamine -Start trazodone 50 mg nightly for insomnia  Nicotine use disorder -Continue NRT with nicotine patch  History of CAD status post CABG Hypercholesterolemia -Nitroglycerin as needed for chest pain  Hypertension -Continue formulary alternative irbesartan 300 mg plus HCTZ 25 mg  daily -Continue amlodipine 5 mg daily for refractory hypertension  Dispo: [facility] 12/24/2021   Park Pope, MD 12/21/2021 11:38 AM

## 2021-12-21 NOTE — Progress Notes (Addendum)
SPIRITUALITY GROUP NOTE  Spirituality group facilitated by Wilkie Aye, MDiv, BCC.  Group Description:  Group focused on topic of hope.  Patients participated in facilitated discussion around topic, connecting with one another around experiences and definitions for hope.  Group members engaged with visual explorer photos, reflecting on what hope looks like for them today.  Group engaged in discussion around how their definitions of hope are present today in hospital.   Modalities: Psycho-social ed, Adlerian, Narrative, MI Patient Progress: Edward Farmer was present throughout group.  Active in discussion, engaging with facilitator and group members voluntarily.  Spoke with group about his motivation - noting he is working to stay connected to his reasons for coming to detox / rehab.

## 2021-12-22 DIAGNOSIS — I2510 Atherosclerotic heart disease of native coronary artery without angina pectoris: Secondary | ICD-10-CM | POA: Diagnosis not present

## 2021-12-22 DIAGNOSIS — J45909 Unspecified asthma, uncomplicated: Secondary | ICD-10-CM | POA: Diagnosis not present

## 2021-12-22 DIAGNOSIS — F1911 Other psychoactive substance abuse, in remission: Secondary | ICD-10-CM | POA: Diagnosis not present

## 2021-12-22 DIAGNOSIS — I10 Essential (primary) hypertension: Secondary | ICD-10-CM | POA: Diagnosis not present

## 2021-12-22 NOTE — Progress Notes (Signed)
Patient watched a recovery video and participated in discussion with Clinical research associate and peers afterwards.

## 2021-12-22 NOTE — ED Notes (Signed)
Pt assessed in [facility] dining area. Calm, cooperative with no complaints of pain. Denies SI, HA, and AVH at present. Plans to attend AA meeting. Will continue to monitor for safety.  

## 2021-12-22 NOTE — ED Notes (Signed)
Pt sleeping in bed. Respirations even and unlabored with no signs of acute distress noted. Will continue to monitor for safety.  

## 2021-12-22 NOTE — Progress Notes (Signed)
Patient complained of constipation.  MOM given.  Awaiting results.

## 2021-12-22 NOTE — ED Provider Notes (Signed)
Behavioral Health Progress Note  Date and Time: 12/22/2021 10:15 AM Name: Edward Farmer MRN:  [id number]  Subjective:  patient initially presented to [facility] on 12/18/2021 requesting detox and residential substance abuse treatment.  Edward Farmer, 61 y.o., male patient seen face to face by this provider, consulted with Dr. Lucianne Muss; and chart reviewed on 12/22/21.  Per chart review patient has a history of polysubstance abuse.  Upon admission UDS is positive for cocaine and EtOH is negative.  During evaluation Edward Farmer is laying in his bed awake in no acute distress he is alert/oriented x 4; calm/cooperative; and mood congruent with affect.  He is pleasant.  His speech is clear and coherent at a normal rate and tone.  He makes good eye contact.  He continues to deny SI/HI/AVH.  He contracts for safety.  He denies any access to firearms/weapons.  There is no indication that he is currently responding to internal/external stimuli or experiencing delusional thought content.  He denies any depression or anxiety at this time.  He is denying any concerns with appetite or sleep. Patient has remained calm throughout assessment and has answered questions appropriately.    He denies any alcohol withdrawal symptoms at this time. Patient has been accepted to [facility] residential treatment facility on 12/24/2021.  Patient is requesting to be discharged in the a.m.  Reports he needs to see his family and gather his personal items that he will need for his stay at [facility].  Discussed the importance of maintaining sobriety.  Also discussed the importance of showing up on time to [facility] residential and to be at his appointment no later than 8 AM on Monday morning.  Patient verbalized understanding.  Diagnosis:  Final diagnoses:  Polysubstance abuse (HCC)    Total Time spent with patient: 30 minutes  Past Psychiatric History: see h&P Past Medical History:  Past Medical History:  Diagnosis Date   Asthma     Bradycardia 07/14/2016   on BB. Rate improved with discontinuation.    Coronary artery disease    Hypertension    Tobacco abuse     Past Surgical History:  Procedure Laterality Date   CORONARY ARTERY BYPASS GRAFT N/A 07/17/2016   Procedure: CORONARY ARTERY BYPASS GRAFTING (CABG) x 1. OFF PUMP. LIMA TO LAD.;  Surgeon: Delight Ovens, MD;  Location: [facility] OR;  Service: Open Heart Surgery;  Laterality: N/A;   LEFT HEART CATH AND CORONARY ANGIOGRAPHY N/A 07/16/2016   Procedure: Left Heart Cath and Coronary Angiography;  Surgeon: Marykay Lex, MD;  Location: [facility] INVASIVE CV LAB;  Service: Cardiovascular;  Laterality: N/A;   LEFT HEART CATH AND CORONARY ANGIOGRAPHY N/A 04/19/2020   Procedure: LEFT HEART CATH AND CORONARY ANGIOGRAPHY;  Surgeon: Lennette Bihari, MD;  Location: [facility] INVASIVE CV LAB;  Service: Cardiovascular;  Laterality: N/A;   TEE WITHOUT CARDIOVERSION N/A 07/17/2016   Procedure: TRANSESOPHAGEAL ECHOCARDIOGRAM (TEE);  Surgeon: Delight Ovens, MD;  Location: [facility] OR;  Service: Open Heart Surgery;  Laterality: N/A;   Family History:  Family History  Problem Relation Age of Onset   Migraines Mother    Heart attack Father    Family Psychiatric  History: See H&P Social History:  Social History   Substance and Sexual Activity  Alcohol Use Yes   Alcohol/week: 0.0 standard drinks of alcohol   Comment: occ     Social History   Substance and Sexual Activity  Drug Use No    Social History  Socioeconomic History   Marital status: Legally Separated    Spouse name: Not on file   Number of children: Not on file   Years of education: Not on file   Highest education level: Not on file  Occupational History   Not on file  Tobacco Use   Smoking status: Every Day    Packs/day: 0.25    Types: Cigars, Cigarettes   Smokeless tobacco: Never   Tobacco comments:    cutting back  Vaping Use   Vaping Use: Never used  Substance and Sexual Activity   Alcohol use: Yes     Alcohol/week: 0.0 standard drinks of alcohol    Comment: occ   Drug use: No   Sexual activity: Not on file  Other Topics Concern   Not on file  Social History Narrative   Not on file   Social Determinants of Health   Financial Resource Strain: Not on file  Food Insecurity: Not on file  Transportation Needs: No Transportation Needs (04/25/2020)   PRAPARE - Administrator, Civil Service (Medical): No    Lack of Transportation (Non-Medical): No  Physical Activity: Not on file  Stress: Not on file  Social Connections: Not on file   SDOH:  SDOH Screenings   Alcohol Screen: Not on file  Depression (PHQ2-9): Low Risk  (12/19/2021)   Depression (PHQ2-9)    PHQ-2 Score: 1  Financial Resource Strain: Not on file  Food Insecurity: Not on file  Housing: Medium Risk (04/25/2020)   Housing    Last Housing Risk Score: 1  Physical Activity: Not on file  Social Connections: Not on file  Stress: Not on file  Tobacco Use: High Risk (12/19/2021)   Patient History    Smoking Tobacco Use: Every Day    Smokeless Tobacco Use: Never    Passive Exposure: Not on file  Transportation Needs: No Transportation Needs (04/25/2020)   PRAPARE - Transportation    Lack of Transportation (Medical): No    Lack of Transportation (Non-Medical): No   Additional Social History:    Pain Medications: See MAR Prescriptions: See MAR Over the Counter: See MAR History of alcohol / drug use?: Yes Negative Consequences of Use: Financial, Personal relationships Name of Substance 1: ETOH 1 - Age of First Use: Unknown 1 - Amount (size/oz): 3-40s and 1/5 of liquor 1 - Frequency: daily 1 - Duration: "years" 1 - Last Use / Amount: last night-2 -40s 1 - Method of Aquiring: NA 1- Route of Use: NA Name of Substance 2: Cocaine 2 - Age of First Use: around 49 2 - Amount (size/oz): unable to estimate 2 - Frequency: daily 2 - Duration: "Years" 2 - Last Use / Amount: this morning/8 ball 2 - Method of  Aquiring: NA 2 - Route of Substance Use: NA                Sleep: Good  Appetite:  Good  Current Medications:  Current Facility-Administered Medications  Medication Dose Route Frequency Provider Last Rate Last Admin   acetaminophen (TYLENOL) tablet 650 mg  650 mg Oral Q6H PRN Lauree Chandler, NP       alum & mag hydroxide-simeth (MAALOX/MYLANTA) 200-200-20 MG/5ML suspension 30 mL  30 mL Oral Q4H PRN Lauree Chandler, NP       amLODipine (NORVASC) tablet 5 mg  5 mg Oral Daily Park Pope, MD   5 mg at 12/22/21 0948   irbesartan (AVAPRO) tablet 300 mg  300 mg Oral  Daily Nelly Rout, MD   300 mg at 12/22/21 4270   And   hydrochlorothiazide (HYDRODIURIL) tablet 25 mg  25 mg Oral Daily Nelly Rout, MD   25 mg at 12/22/21 0948   magnesium hydroxide (MILK OF MAGNESIA) suspension 30 mL  30 mL Oral Daily PRN Lauree Chandler, NP       multivitamin with minerals tablet 1 tablet  1 tablet Oral Daily Lauree Chandler, NP   1 tablet at 12/22/21 0948   nicotine (NICODERM CQ - dosed in mg/24 hours) patch 21 mg  21 mg Transdermal Q0600 Lauree Chandler, NP   21 mg at 12/22/21 6237   nitroGLYCERIN (NITROSTAT) SL tablet 0.4 mg  0.4 mg Sublingual Q5 Min x 3 PRN Lauree Chandler, NP       thiamine (VITAMIN B1) tablet 100 mg  100 mg Oral Daily Lauree Chandler, NP   100 mg at 12/22/21 0948   traZODone (DESYREL) tablet 50 mg  50 mg Oral Seabron Spates, MD   50 mg at 12/21/21 2114   Current Outpatient Medications  Medication Sig Dispense Refill   nitroGLYCERIN (NITROSTAT) 0.4 MG SL tablet Place 1 tablet (0.4 mg total) under the tongue every 5 (five) minutes x 3 doses as needed for chest pain. 25 tablet 3   amLODipine (NORVASC) 5 MG tablet Take 1 tablet (5 mg total) by mouth daily. 30 tablet 0   hydrochlorothiazide (HYDRODIURIL) 25 MG tablet Take 1 tablet (25 mg total) by mouth daily. 30 tablet 0   irbesartan (AVAPRO) 300 MG tablet Take 1 tablet (300 mg total) by mouth daily. 30  tablet 0   Multiple Vitamin (MULTIVITAMIN WITH MINERALS) TABS tablet Take 1 tablet by mouth daily. 30 tablet 0   nicotine (NICODERM CQ - DOSED IN MG/24 HOURS) 21 mg/24hr patch Place 1 patch (21 mg total) onto the skin daily at 6 (six) AM. 28 patch 0   traZODone (DESYREL) 50 MG tablet Take 1 tablet (50 mg total) by mouth at bedtime. 30 tablet 0    Labs  Lab Results:  Admission on 12/18/2021  Component Date Value Ref Range Status   SARS Coronavirus 2 by RT PCR 12/18/2021 NEGATIVE  NEGATIVE Final   Comment: (NOTE) SARS-CoV-2 target nucleic acids are NOT DETECTED.  The SARS-CoV-2 RNA is generally detectable in upper respiratory specimens during the acute phase of infection. The lowest concentration of SARS-CoV-2 viral copies this assay can detect is 138 copies/mL. A negative result does not preclude SARS-Cov-2 infection and should not be used as the sole basis for treatment or other patient management decisions. A negative result may occur with  improper specimen collection/handling, submission of specimen other than nasopharyngeal swab, presence of viral mutation(s) within the areas targeted by this assay, and inadequate number of viral copies(<138 copies/mL). A negative result must be combined with clinical observations, patient history, and epidemiological information. The expected result is Negative.  Fact Sheet for Patients:  BloggerCourse.com  Fact Sheet for Healthcare Providers:  SeriousBroker.it  This test is no                          t yet approved or cleared by the [place] FDA and  has been authorized for detection and/or diagnosis of SARS-CoV-2 by FDA under an Emergency Use Authorization (EUA). This EUA will remain  in effect (meaning this test can be used) for the duration of the COVID-19 declaration under Section 564(b)(1)  of the Act, 21 U.S.C.section 360bbb-3(b)(1), unless the authorization is terminated  or  revoked sooner.       Influenza A by PCR 12/18/2021 NEGATIVE  NEGATIVE Final   Influenza B by PCR 12/18/2021 NEGATIVE  NEGATIVE Final   Comment: (NOTE) The Xpert Xpress SARS-CoV-2/FLU/RSV plus assay is intended as an aid in the diagnosis of influenza from Nasopharyngeal swab specimens and should not be used as a sole basis for treatment. Nasal washings and aspirates are unacceptable for Xpert Xpress SARS-CoV-2/FLU/RSV testing.  Fact Sheet for Patients: BloggerCourse.com  Fact Sheet for Healthcare Providers: SeriousBroker.it  This test is not yet approved or cleared by the [place] FDA and has been authorized for detection and/or diagnosis of SARS-CoV-2 by FDA under an Emergency Use Authorization (EUA). This EUA will remain in effect (meaning this test can be used) for the duration of the COVID-19 declaration under Section 564(b)(1) of the Act, 21 U.S.C. section 360bbb-3(b)(1), unless the authorization is terminated or revoked.  Performed at [facility], [address], [place], [place] [address]    WBC 12/18/2021 6.8  4.0 - 10.5 K/uL Final   RBC 12/18/2021 4.91  4.22 - 5.81 MIL/uL Final   Hemoglobin 12/18/2021 13.2  13.0 - 17.0 g/dL Final   HCT 60/45/4098 41.5  39.0 - 52.0 % Final   MCV 12/18/2021 84.5  80.0 - 100.0 fL Final   MCH 12/18/2021 26.9  26.0 - 34.0 pg Final   MCHC 12/18/2021 31.8  30.0 - 36.0 g/dL Final   RDW 11/91/4782 16.1 (H)  11.5 - 15.5 % Final   Platelets 12/18/2021 194  150 - 400 K/uL Final   nRBC 12/18/2021 0.0  0.0 - 0.2 % Final   Neutrophils Relative % 12/18/2021 58  % Final   Neutro Abs 12/18/2021 4.0  1.7 - 7.7 K/uL Final   Lymphocytes Relative 12/18/2021 30  % Final   Lymphs Abs 12/18/2021 2.1  0.7 - 4.0 K/uL Final   Monocytes Relative 12/18/2021 9  % Final   Monocytes Absolute 12/18/2021 0.6  0.1 - 1.0 K/uL Final   Eosinophils Relative 12/18/2021 2  % Final   Eosinophils Absolute  12/18/2021 0.1  0.0 - 0.5 K/uL Final   Basophils Relative 12/18/2021 0  % Final   Basophils Absolute 12/18/2021 0.0  0.0 - 0.1 K/uL Final   Immature Granulocytes 12/18/2021 1  % Final   Abs Immature Granulocytes 12/18/2021 0.05  0.00 - 0.07 K/uL Final   Performed at [facility], [address], [place], [place] [address]   Sodium 12/18/2021 139  135 - 145 mmol/L Final   Potassium 12/18/2021 3.8  3.5 - 5.1 mmol/L Final   Chloride 12/18/2021 107  98 - 111 mmol/L Final   CO2 12/18/2021 25  22 - 32 mmol/L Final   Glucose, Bld 12/18/2021 95  70 - 99 mg/dL Final   Glucose reference range applies only to samples taken after fasting for at least 8 hours.   BUN 12/18/2021 15  8 - 23 mg/dL Final   Creatinine, Ser 12/18/2021 0.95  0.61 - 1.24 mg/dL Final   Calcium 30/86/5784 9.3  8.9 - 10.3 mg/dL Final   Total Protein 69/62/9528 7.5  6.5 - 8.1 g/dL Final   Albumin 41/32/4401 3.4 (L)  3.5 - 5.0 g/dL Final   AST 02/72/5366 17  15 - 41 U/L Final   ALT 12/18/2021 17  0 - 44 U/L Final   Alkaline Phosphatase 12/18/2021 89  38 -  126 U/L Final   Total Bilirubin 12/18/2021 0.4  0.3 - 1.2 mg/dL Final   GFR, Estimated 12/18/2021 >60  >60 mL/min Final   Comment: (NOTE) Calculated using the CKD-EPI Creatinine Equation (2021)    Anion gap 12/18/2021 7  5 - 15 Final   Performed at [facility], [address], [place], [place] [address]   Hgb A1c MFr Bld 12/18/2021 5.9 (H)  4.8 - 5.6 % Final   Comment: (NOTE) Pre diabetes:          5.7%-6.4%  Diabetes:              >6.4%  Glycemic control for   <7.0% adults with diabetes    Mean Plasma Glucose 12/18/2021 122.63  mg/dL Final   Performed at [facility], [address], [place], [place] [address]   Alcohol, Ethyl (B) 12/18/2021 <10  <10 mg/dL Final   Comment: (NOTE) Lowest detectable limit for serum alcohol is 10 mg/dL.  For medical purposes only. Performed at [facility], [address] [place], [place] [address]    POC  Amphetamine UR 12/18/2021 None Detected  NONE DETECTED (Cut Off Level 1000 ng/mL) Final   POC Secobarbital (BAR) 12/18/2021 None Detected  NONE DETECTED (Cut Off Level 300 ng/mL) Final   POC Buprenorphine (BUP) 12/18/2021 None Detected  NONE DETECTED (Cut Off Level 10 ng/mL) Final   POC Oxazepam (BZO) 12/18/2021 None Detected  NONE DETECTED (Cut Off Level 300 ng/mL) Final   POC Cocaine UR 12/18/2021 Positive (A)  NONE DETECTED (Cut Off Level 300 ng/mL) Final   POC Methamphetamine UR 12/18/2021 None Detected  NONE DETECTED (Cut Off Level 1000 ng/mL) Final   POC Morphine 12/18/2021 None Detected  NONE DETECTED (Cut Off Level 300 ng/mL) Final   POC Methadone UR 12/18/2021 None Detected  NONE DETECTED (Cut Off Level 300 ng/mL) Final   POC Oxycodone UR 12/18/2021 None Detected  NONE DETECTED (Cut Off Level 100 ng/mL) Final   POC Marijuana UR 12/18/2021 None Detected  NONE DETECTED (Cut Off Level 50 ng/mL) Final   TSH 12/18/2021 0.875  0.350 - 4.500 uIU/mL Final   Comment: Performed by a 3rd Generation assay with a functional sensitivity of <=0.01 uIU/mL. Performed at [facility], [address], [place], [place] [address]    SARSCOV2ONAVIRUS 2 AG 12/18/2021 NEGATIVE  NEGATIVE Final   Comment: (NOTE) SARS-CoV-2 antigen NOT DETECTED.   Negative results are presumptive.  Negative results do not preclude SARS-CoV-2 infection and should not be used as the sole basis for treatment or other patient management decisions, including infection  control decisions, particularly in the presence of clinical signs and  symptoms consistent with COVID-19, or in those who have been in contact with the virus.  Negative results must be combined with clinical observations, patient history, and epidemiological information. The expected result is Negative.  Fact Sheet for Patients: https://www.jennings-kim.com/  Fact Sheet for Healthcare  Providers: https://alexander-rogers.biz/  This test is not yet approved or cleared by the [place] FDA and  has been authorized for detection and/or diagnosis of SARS-CoV-2 by FDA under an Emergency Use Authorization (EUA).  This EUA will remain in effect (meaning this test can be used) for the duration of  the COV                          ID-19 declaration under Section 564(b)(1) of the Act, 21 U.S.C. section 360bbb-3(b)(1),  unless the authorization is terminated or revoked sooner.     Cholesterol 12/18/2021 171  0 - 200 mg/dL Final   Triglycerides 16/10/960408/15/2023 88  <150 mg/dL Final   HDL 54/09/811908/15/2023 34 (L)  >40 mg/dL Final   Total CHOL/HDL Ratio 12/18/2021 5.0  RATIO Final   VLDL 12/18/2021 18  0 - 40 mg/dL Final   LDL Cholesterol 12/18/2021 119 (H)  0 - 99 mg/dL Final   Comment:        Total Cholesterol/HDL:CHD Risk Coronary Heart Disease Risk Table                     Men   Women  1/2 Average Risk   3.4   3.3  Average Risk       5.0   4.4  2 X Average Risk   9.6   7.1  3 X Average Risk  23.4   11.0        Use the calculated Patient Ratio above and the CHD Risk Table to determine the patient's CHD Risk.        ATP III CLASSIFICATION (LDL):  <100     mg/dL   Optimal  147-829100-129  mg/dL   Near or Above                    Optimal  130-159  mg/dL   Borderline  562-130160-189  mg/dL   High  >865>190     mg/dL   Very High Performed at [facility], [address] [place], [place] [id number]     Blood Alcohol level:  Lab Results  Component Value Date   ETH <10 12/18/2021    Metabolic Disorder Labs: Lab Results  Component Value Date   HGBA1C 5.9 (H) 12/18/2021   MPG 122.63 12/18/2021   MPG 125.5 04/19/2020   No results found for: "PROLACTIN" Lab Results  Component Value Date   CHOL 171 12/18/2021   TRIG 88 12/18/2021   HDL 34 (L) 12/18/2021   CHOLHDL 5.0 12/18/2021   VLDL 18 12/18/2021   LDLCALC 119 (H) 12/18/2021   LDLCALC 104 (H) 01/02/2021     Therapeutic Lab Levels: No results found for: "LITHIUM" No results found for: "VALPROATE" No results found for: "CBMZ"  Physical Findings   GAD-7    Flowsheet Row [facility]  Total GAD-7 Score 9 12      PHQ2-9    Flowsheet Row [facility]  PHQ-2 Total Score 1 1 1 3  0  PHQ-9 Total Score -- -- 9 13 --      Flowsheet Row [facility]  C-SSRS RISK CATEGORY No Risk        Musculoskeletal  Strength & Muscle Tone: within normal limits Gait & Station: normal Patient leans: N/A  Psychiatric Specialty Exam  Presentation  General Appearance: Appropriate for Environment; Casual  Eye Contact:Good  Speech:Clear and Coherent; Normal Rate  Speech Volume:Normal  Handedness:Right   Mood and Affect  Mood:Euthymic  Affect:Congruent   Thought Process  Thought Processes:Coherent  Descriptions of Associations:Intact  Orientation:Full (Time, Place and Person)  Thought Content:Logical  Diagnosis of Schizophrenia or Schizoaffective disorder in past: No    Hallucinations:Hallucinations: None  Ideas of Reference:None  Suicidal Thoughts:Suicidal Thoughts: No  Homicidal Thoughts:Homicidal Thoughts: No   Sensorium  Memory:Immediate Good; Recent Good; Remote Good  Judgment:Fair  Insight:Fair   Executive Functions  Concentration:Good  Attention Span:Good  Recall:Good  Fund of Knowledge:Good  Language:Good   Psychomotor Activity   Psychomotor Activity:Psychomotor Activity: Normal   Assets  Assets:Communication Skills; Desire for Improvement; Leisure Time; Physical Health; Resilience   Sleep  Sleep:Sleep: Fair   No data recorded  Physical Exam  Physical Exam Vitals and nursing note reviewed.  Constitutional:      General: He is not in acute distress.    Appearance: He is well-developed.  HENT:     Head: Normocephalic and atraumatic.  Eyes:     General:        Right eye: No discharge.        Left eye: No discharge.  Cardiovascular:     Rate and Rhythm: Normal rate.  Pulmonary:     Effort: Pulmonary effort is normal. No respiratory distress.  Musculoskeletal:        General: Normal range of motion.     Cervical back: Normal range of motion.  Skin:    Coloration: Skin is not jaundiced or pale.  Neurological:     Mental Status: He is alert and oriented to person, place, and time.  Psychiatric:        Attention and Perception: Attention and perception normal.        Mood and Affect: Mood normal.        Speech: Speech normal.        Behavior: Behavior normal. Behavior is cooperative.        Thought Content: Thought content normal.        Cognition and Memory: Cognition normal.        Judgment: Judgment normal.    Review of Systems  Constitutional: Negative.   HENT: Negative.    Eyes: Negative.   Respiratory: Negative.    Cardiovascular: Negative.   Musculoskeletal: Negative.   Skin: Negative.   Neurological: Negative.   Psychiatric/Behavioral: Negative.     Blood pressure 133/88, pulse 76, temperature 97.8 F (36.6 C), temperature source Tympanic, resp. rate 20, SpO2 96 %. There is no height or weight on file to calculate BMI.  Treatment Plan Summary:  Disposition: Ongoing.  We will continue to have daily contact with patient to assess and evaluate symptoms and progress in treatment and Medication management.  Patient has been accepted to [facility] residential treatment for 12/24/2021.   He is requesting to be discharged in the a.m. and will present to [facility] residential on his own at his appointment time.  Ardis Hughs, NP 12/22/2021 10:15 AM

## 2021-12-22 NOTE — ED Notes (Signed)
Patient was awake earlier to eat breakfast.  He has since returned to his room to rest where he remains now.  Patient without distress or complaint.  No withdrawal symptoms noted or recorded.  Will monitor and provide a safe environment.

## 2021-12-22 NOTE — ED Notes (Signed)
Patient interacted with peers in dayroom the entire evening; Talking and watching TV. Patient was compliant with medication administration and went to bed.

## 2021-12-23 DIAGNOSIS — F1911 Other psychoactive substance abuse, in remission: Secondary | ICD-10-CM | POA: Diagnosis not present

## 2021-12-23 DIAGNOSIS — I2510 Atherosclerotic heart disease of native coronary artery without angina pectoris: Secondary | ICD-10-CM | POA: Diagnosis not present

## 2021-12-23 DIAGNOSIS — J45909 Unspecified asthma, uncomplicated: Secondary | ICD-10-CM | POA: Diagnosis not present

## 2021-12-23 DIAGNOSIS — I10 Essential (primary) hypertension: Secondary | ICD-10-CM | POA: Diagnosis not present

## 2021-12-23 NOTE — ED Notes (Signed)
Pt calm, cooperative this am. Sitting in dayroom interacting with another peer. Denies SI/HI/AVH/withdrawal sx from ETOH. Informed pt to notify staff with any needs or concerns. Will continue to monitor for safety.

## 2021-12-23 NOTE — ED Notes (Signed)
Patient A&O x 4, ambulatory. Patient discharged in no acute distress. Patient denied SI/HI, A/VH upon discharge. Patient verbalized understanding of all discharge instructions explained by staff, to include follow up appointments, RX's and safety plan. Patient reported mood 10/10.  Pt belongings returned to patient from locker #17 intact. Patient escorted to lobby via staff with bus pass in hand. Safety maintained.

## 2021-12-23 NOTE — ED Provider Notes (Signed)
FBC/OBS ASAP Discharge Summary  Date and Time: 12/23/2021 10:12 AM  Name: Edward Farmer  MRN:  [id number]   Discharge Diagnoses:  Final diagnoses:  Polysubstance abuse [facility])    Subjective: Edward Farmer 61 year old male patient who initially presented to [facility] on 12/18/2021 requesting detox and residential substance abuse treatment and admitted to the [facility] unit.   Edward Farmer, 61 y.o., male patient seen face to face by this provider, consulted with Dr. Lucianne Muss; and chart reviewed on 12/23/21.  Per chart review patient has a history of polysubstance abuse.  Upon admission UDS is positive for cocaine and EtOH is negative.  On today's evaluation patient is observed sitting in the day room eating breakfast he is conversing with another patient.  She is alert/oriented x4, cooperative, and attentive.  He has normal speech and behavior.  He continues to deny depression and anxiety.  He continues to deny SI/HI/AVH.  He contracts for safety and denies any access to firearms/weapons.  He is able to converse coherently with no distractibility or preoccupation.  He is requesting to be discharged.  States he needs to go home to retrieve some personal items that he will need while he is at [facility] residential.He is adamant no one else can gather his items, he must be discharged to do this on his own. He has an intake appointment in the morning at 7:45 AM.  Discussed with patient that he cannot be late to his intake appointment and he must present with his discharge paperwork, medication samples, and printed prescriptions.  He verbalized understanding.  He is denying any alcohol withdrawal symptoms at this time.  Stay Summary:   Edward Farmer was admitted to the [facility] unit for Polysubstance abuse [facility]), alcohol detox and crisis management.  He was treated with the following medications: Amlodipine 5 mg daily hydrochlorothiazide 25 mg daily, Avapro 300 mg daily, multivitamin daily, NicoDerm 21 mg patch daily,  nitroglycerin 0.4 mg SL tablet, trazodone 50 mg nightly as needed.  He tolerated medications without any adverse reactions.  He will be discharged with a 14-day sample of medications including a printed prescription with a 1 month refill.  He is instructed on how to take medications as prescribed.  Edward Farmer will follow up with the services as listed below under Follow up Information.    Upon completion of this admission the Edward Farmer was both mentally and medically stable for discharge denying suicidal/homicidal ideation, auditory/visual/tactile hallucinations, delusional thoughts and paranoia.     Total Time spent with patient: 30 minutes  Past Psychiatric History: see h&p Past Medical History:  Past Medical History:  Diagnosis Date   Asthma    Bradycardia 07/14/2016   on BB. Rate improved with discontinuation.    Coronary artery disease    Hypertension    Tobacco abuse     Past Surgical History:  Procedure Laterality Date   CORONARY ARTERY BYPASS GRAFT N/A 07/17/2016   Procedure: CORONARY ARTERY BYPASS GRAFTING (CABG) x 1. OFF PUMP. LIMA TO LAD.;  Surgeon: Delight Ovens, MD;  Location: [facility] OR;  Service: Open Heart Surgery;  Laterality: N/A;   LEFT HEART CATH AND CORONARY ANGIOGRAPHY N/A 07/16/2016   Procedure: Left Heart Cath and Coronary Angiography;  Surgeon: Marykay Lex, MD;  Location: [facility] INVASIVE CV LAB;  Service: Cardiovascular;  Laterality: N/A;   LEFT HEART CATH AND CORONARY ANGIOGRAPHY N/A 04/19/2020   Procedure: LEFT HEART CATH AND CORONARY ANGIOGRAPHY;  Surgeon:  Lennette Bihari, MD;  Location: [facility] INVASIVE CV LAB;  Service: Cardiovascular;  Laterality: N/A;   TEE WITHOUT CARDIOVERSION N/A 07/17/2016   Procedure: TRANSESOPHAGEAL ECHOCARDIOGRAM (TEE);  Surgeon: Delight Ovens, MD;  Location[facility] OR;  Service: Open Heart Surgery;  Laterality: N/A;   Family History:  Family History  Problem Relation Age of Onset   Migraines Mother    Heart attack Father     Family Psychiatric History: see h&p Social History:  Social History   Substance and Sexual Activity  Alcohol Use Yes   Alcohol/week: 0.0 standard drinks of alcohol   Comment: occ     Social History   Substance and Sexual Activity  Drug Use No    Social History   Socioeconomic History   Marital status: Legally Separated    Spouse name: Not on file   Number of children: Not on file   Years of education: Not on file   Highest education level: Not on file  Occupational History   Not on file  Tobacco Use   Smoking status: Every Day    Packs/day: 0.25    Types: Cigars, Cigarettes   Smokeless tobacco: Never   Tobacco comments:    cutting back  Vaping Use   Vaping Use: Never used  Substance and Sexual Activity   Alcohol use: Yes    Alcohol/week: 0.0 standard drinks of alcohol    Comment: occ   Drug use: No   Sexual activity: Not on file  Other Topics Concern   Not on file  Social History Narrative   Not on file   Social Determinants of Health   Financial Resource Strain: Not on file  Food Insecurity: Not on file  Transportation Needs: No Transportation Needs (04/25/2020)   PRAPARE - Administrator, Civil Service (Medical): No    Lack of Transportation (Non-Medical): No  Physical Activity: Not on file  Stress: Not on file  Social Connections: Not on file   SDOH:  SDOH Screenings   Alcohol Screen: Not on file  Depression (PHQ2-9): Low Risk  (12/19/2021)   Depression (PHQ2-9)    PHQ-2 Score: 1  Financial Resource Strain: Not on file  Food Insecurity: Not on file  Housing: Medium Risk (04/25/2020)   Housing    Last Housing Risk Score: 1  Physical Activity: Not on file  Social Connections: Not on file  Stress: Not on file  Tobacco Use: High Risk (12/19/2021)   Patient History    Smoking Tobacco Use: Every Day    Smokeless Tobacco Use: Never    Passive Exposure: Not on file  Transportation Needs: No Transportation Needs (04/25/2020)    PRAPARE - Transportation    Lack of Transportation (Medical): No    Lack of Transportation (Non-Medical): No    Tobacco Cessation:  A prescription for an FDA-approved tobacco cessation medication provided at discharge  Current Medications:  Current Facility-Administered Medications  Medication Dose Route Frequency Provider Last Rate Last Admin   acetaminophen (TYLENOL) tablet 650 mg  650 mg Oral Q6H PRN Lauree Chandler, NP       alum & mag hydroxide-simeth (MAALOX/MYLANTA) 200-200-20 MG/5ML suspension 30 mL  30 mL Oral Q4H PRN Lauree Chandler, NP       amLODipine (NORVASC) tablet 5 mg  5 mg Oral Daily Park Pope, MD   5 mg at 12/22/21 0948   irbesartan (AVAPRO) tablet 300 mg  300 mg Oral Daily Nelly Rout, MD   300 mg at  12/22/21 0948   And   hydrochlorothiazide (HYDRODIURIL) tablet 25 mg  25 mg Oral Daily Nelly Rout, MD   25 mg at 12/22/21 0948   magnesium hydroxide (MILK OF MAGNESIA) suspension 30 mL  30 mL Oral Daily PRN Lauree Chandler, NP   30 mL at 12/22/21 1826   multivitamin with minerals tablet 1 tablet  1 tablet Oral Daily Lauree Chandler, NP   1 tablet at 12/22/21 0948   nicotine (NICODERM CQ - dosed in mg/24 hours) patch 21 mg  21 mg Transdermal Q0600 Lauree Chandler, NP   21 mg at 12/23/21 0600   nitroGLYCERIN (NITROSTAT) SL tablet 0.4 mg  0.4 mg Sublingual Q5 Min x 3 PRN Lauree Chandler, NP       thiamine (VITAMIN B1) tablet 100 mg  100 mg Oral Daily Lauree Chandler, NP   100 mg at 12/22/21 0948   traZODone (DESYREL) tablet 50 mg  50 mg Oral Seabron Spates, MD   50 mg at 12/22/21 2101   Current Outpatient Medications  Medication Sig Dispense Refill   nitroGLYCERIN (NITROSTAT) 0.4 MG SL tablet Place 1 tablet (0.4 mg total) under the tongue every 5 (five) minutes x 3 doses as needed for chest pain. 25 tablet 3   amLODipine (NORVASC) 5 MG tablet Take 1 tablet (5 mg total) by mouth daily. 30 tablet 0   hydrochlorothiazide (HYDRODIURIL) 25 MG  tablet Take 1 tablet (25 mg total) by mouth daily. 30 tablet 0   irbesartan (AVAPRO) 300 MG tablet Take 1 tablet (300 mg total) by mouth daily. 30 tablet 0   Multiple Vitamin (MULTIVITAMIN WITH MINERALS) TABS tablet Take 1 tablet by mouth daily. 30 tablet 0   nicotine (NICODERM CQ - DOSED IN MG/24 HOURS) 21 mg/24hr patch Place 1 patch (21 mg total) onto the skin daily at 6 (six) AM. 28 patch 0   traZODone (DESYREL) 50 MG tablet Take 1 tablet (50 mg total) by mouth at bedtime. 30 tablet 0    PTA Medications: (Not in a hospital admission)      12/19/2021    3:08 PM 01/30/2021    4:47 PM 12/29/2019    9:08 AM  Depression screen PHQ 2/9  Decreased Interest 0 1 1  Down, Depressed, Hopeless 1 0 0  PHQ - 2 Score 1 1 1   Altered sleeping   2  Tired, decreased energy   2  Change in appetite   2  Feeling bad or failure about yourself    0  Trouble concentrating   1  Moving slowly or fidgety/restless   1  Suicidal thoughts   0  PHQ-9 Score   9    Flowsheet Row ED from 12/18/2021 in [facility]  C-SSRS RISK CATEGORY No Risk       Musculoskeletal  Strength & Muscle Tone: within normal limits Gait & Station: normal Patient leans: N/A  Psychiatric Specialty Exam  Presentation  General Appearance: Appropriate for Environment; Casual  Eye Contact:Good  Speech:Clear and Coherent; Normal Rate  Speech Volume:Normal  Handedness:Right   Mood and Affect  Mood:Euthymic  Affect:Congruent   Thought Process  Thought Processes:Coherent  Descriptions of Associations:Intact  Orientation:Full (Time, Place and Person)  Thought Content:Logical  Diagnosis of Schizophrenia or Schizoaffective disorder in past: No    Hallucinations:Hallucinations: None  Ideas of Reference:None  Suicidal Thoughts:Suicidal Thoughts: No  Homicidal Thoughts:Homicidal Thoughts: No   Sensorium  Memory:Immediate Good; Recent Good; Remote  Good  Judgment:Good  Insight:Good   Executive Functions  Concentration:Good  Attention Span:Good  Recall:Good  Fund of Knowledge:Good  Language:Good   Psychomotor Activity  Psychomotor Activity:Psychomotor Activity: Normal   Assets  Assets:Communication Skills; Desire for Improvement; Financial Resources/Insurance; Leisure Time; Physical Health; Resilience; Social Support   Sleep  Sleep:Sleep: Fair   No data recorded  Physical Exam  Physical Exam Vitals and nursing note reviewed.  Constitutional:      General: He is not in acute distress.    Appearance: He is well-developed.  HENT:     Head: Normocephalic and atraumatic.  Eyes:     Conjunctiva/sclera: Conjunctivae normal.  Cardiovascular:     Rate and Rhythm: Normal rate.  Pulmonary:     Effort: Pulmonary effort is normal. No respiratory distress.  Musculoskeletal:        General: Normal range of motion.     Cervical back: Neck supple.  Skin:    Coloration: Skin is not jaundiced or pale.  Neurological:     Mental Status: He is alert and oriented to person, place, and time.  Psychiatric:        Attention and Perception: Attention and perception normal.        Mood and Affect: Mood normal.        Speech: Speech normal.        Behavior: Behavior normal. Behavior is cooperative.        Thought Content: Thought content normal.        Cognition and Memory: Cognition normal.        Judgment: Judgment normal.    Review of Systems  Constitutional: Negative.   HENT: Negative.    Eyes: Negative.   Respiratory: Negative.    Cardiovascular: Negative.   Musculoskeletal: Negative.   Skin: Negative.   Neurological: Negative.   Psychiatric/Behavioral: Negative.     Blood pressure (!) 137/96, pulse 74, temperature 98 F (36.7 C), temperature source Tympanic, resp. rate 20, SpO2 100 %. There is no height or weight on file to calculate BMI.  Demographic Factors:  Male, Low socioeconomic status, Living alone,  and Unemployed  Loss Factors: Financial problems/change in socioeconomic status  Historical Factors: Impulsivity  Risk Reduction Factors:   Sense of responsibility to family, Positive social support, Positive therapeutic relationship, and Positive coping skills or problem solving skills  Continued Clinical Symptoms:  Alcohol/Substance Abuse/Dependencies  Cognitive Features That Contribute To Risk:  None    Suicide Risk:  Minimal: No identifiable suicidal ideation.  Patients presenting with no risk factors but with morbid ruminations; may be classified as minimal risk based on the severity of the depressive symptoms  Plan Of Care/Follow-up recommendations:  Activity:  as tolerated  Diet:  regular   Disposition:   Discharge patient  Patient has an intake appointment with [facility] in the a.m. on 12/24/2021 at 7:45 AM.  He was provided with a 14-day sample and a printed prescription with a 1 month refill of the following medications: Amlodipine 5 mg daily hydrochlorothiazide 25 mg daily, Avapro 300 mg daily, multivitamin daily, NicoDerm 21 mg patch daily, nitroglycerin 0.4 mg SL tablet, trazodone 50 mg nightly as needed.    Ardis Hughs, NP 12/23/2021, 10:12 AM

## 2021-12-23 NOTE — ED Notes (Signed)
Pt asleep in bed. Respirations even and unlabored with no signs of acute distress noted. Will continue to monitor for safety. 

## 2022-01-04 DIAGNOSIS — R072 Precordial pain: Secondary | ICD-10-CM | POA: Diagnosis not present

## 2022-01-04 DIAGNOSIS — R079 Chest pain, unspecified: Secondary | ICD-10-CM | POA: Diagnosis not present

## 2022-01-04 DIAGNOSIS — R9431 Abnormal electrocardiogram [ECG] [EKG]: Secondary | ICD-10-CM | POA: Diagnosis not present

## 2022-01-14 ENCOUNTER — Encounter: Payer: Self-pay | Admitting: Physician Assistant

## 2022-01-14 ENCOUNTER — Ambulatory Visit: Payer: Medicaid Other | Admitting: Physician Assistant

## 2022-01-14 VITALS — BP 126/85 | Resp 20

## 2022-01-14 DIAGNOSIS — I25718 Atherosclerosis of autologous vein coronary artery bypass graft(s) with other forms of angina pectoris: Secondary | ICD-10-CM

## 2022-01-14 DIAGNOSIS — E782 Mixed hyperlipidemia: Secondary | ICD-10-CM

## 2022-01-14 DIAGNOSIS — J4520 Mild intermittent asthma, uncomplicated: Secondary | ICD-10-CM

## 2022-01-14 DIAGNOSIS — F5104 Psychophysiologic insomnia: Secondary | ICD-10-CM

## 2022-01-14 DIAGNOSIS — I10 Essential (primary) hypertension: Secondary | ICD-10-CM

## 2022-01-14 DIAGNOSIS — F1910 Other psychoactive substance abuse, uncomplicated: Secondary | ICD-10-CM

## 2022-01-14 DIAGNOSIS — Z951 Presence of aortocoronary bypass graft: Secondary | ICD-10-CM

## 2022-01-14 MED ORDER — ISOSORBIDE MONONITRATE ER 30 MG PO TB24: 30.0000 mg | ORAL_TABLET | Freq: Every day | ORAL | 1 refills | Status: AC

## 2022-01-14 MED ORDER — IRBESARTAN 300 MG PO TABS: 300.0000 mg | ORAL_TABLET | Freq: Every day | ORAL | 1 refills | Status: AC

## 2022-01-14 MED ORDER — TRAZODONE HCL 50 MG PO TABS: 50.0000 mg | ORAL_TABLET | Freq: Every day | ORAL | 1 refills | Status: AC

## 2022-01-14 MED ORDER — HYDROCHLOROTHIAZIDE 25 MG PO TABS: 25.0000 mg | ORAL_TABLET | Freq: Every day | ORAL | 1 refills | Status: AC

## 2022-01-14 MED ORDER — ASPIRIN 325 MG PO TBEC: 325.0000 mg | DELAYED_RELEASE_TABLET | Freq: Every day | ORAL | 1 refills | Status: AC

## 2022-01-14 MED ORDER — AMLODIPINE BESYLATE 5 MG PO TABS: 5.0000 mg | ORAL_TABLET | Freq: Every day | ORAL | 1 refills | Status: AC

## 2022-01-14 MED ORDER — CETIRIZINE HCL 10 MG PO TABS: 10.0000 mg | ORAL_TABLET | Freq: Every day | ORAL | 1 refills | Status: AC

## 2022-01-14 NOTE — Progress Notes (Unsigned)
Established Patient Office Visit  Subjective   Patient ID: Edward Farmer, male    DOB: Sep 14, 1960  Age: 61 y.o. MRN: [id number]  Chief Complaint  Patient presents with   Medication Refill   Chest Pain    Intermittent chest pain    States that he continues to be treated for substance abuse at [facility] residential treatment center, states that he is waiting for his stress test to be completed prior to learning of his aftercare placement.  States that he was seen at [facility] on January 04, 2022 due to intermittent chest pains.  Note from that visit  HPI  Pt is a 61 y.o. male who presents with chest pain. Pt states that this has been going on for two days. He is currently doing a drug rehab stay at [facility], whose staff advised him to come to ED. Pt also has history of CABG, performed at [facility]. He states that before the surgery he had episodes of pain where he would clutch his chest and feels that is occurring again. He is not sure that NTG is helping his symptoms. He denies illness. He does note mild DOE but denies SOB at rest. He denies nausea or sweating associated with symptoms.  ED Assessment/Plan and Clinical Impression  Pt is a 61 y.o. male who presented due to recurrent episode of CP which occured at rest. Pt appears to have very mild trop without signficant change despite checking several times. No recurrence of symptoms in E. Plan is outpatient stress test as patient does not want to be admitted at this time. Pt advised to follow up with: PCP and ED if symptoms worsen or change    States today that he has had the chest pains again twice since he was seen in the emergency department.  States that they last 2 to 3 minutes.  States that he is taking the aspirin 325 mg once a day.  States that his blood pressure readings have been within normal limits.  Does endorse that he has been having depressed and anxious moods, states that he does not feel he needs to use medication  to help him with this, states that he feels it is situational and due to "all the rules"  States that sleep is good, appetite is good.  States that he does not have a CPAP, states he is waiting for his sister to bring him the mask but will begin using it again on a nightly basis.    Does endorse history of asthma, states that he was previously using Zyrtec on a daily basis to help control allergens. Past Medical History:  Diagnosis Date   Asthma    Bradycardia 07/14/2016   on BB. Rate improved with discontinuation.    Coronary artery disease    Hypertension    Tobacco abuse    Social History   Socioeconomic History   Marital status: Legally Separated    Spouse name: Not on file   Number of children: Not on file   Years of education: Not on file   Highest education level: Not on file  Occupational History   Not on file  Tobacco Use   Smoking status: Every Day    Packs/day: 0.25    Types: Cigars, Cigarettes   Smokeless tobacco: Never   Tobacco comments:    cutting back  Vaping Use   Vaping Use: Never used  Substance and Sexual Activity   Alcohol use: Yes    Alcohol/week:  0.0 standard drinks of alcohol    Comment: occ   Drug use: No   Sexual activity: Not on file  Other Topics Concern   Not on file  Social History Narrative   Not on file   Social Determinants of Health   Financial Resource Strain: Not on file  Food Insecurity: Not on file  Transportation Needs: No Transportation Needs (04/25/2020)   PRAPARE - Administrator, Civil Service (Medical): No    Lack of Transportation (Non-Medical): No  Physical Activity: Not on file  Stress: Not on file  Social Connections: Not on file  Intimate Partner Violence: Not on file   Family History  Problem Relation Age of Onset   Migraines Mother    Heart attack Father    No Known Allergies  Review of Systems  Constitutional: Negative.   HENT: Negative.    Eyes: Negative.   Respiratory:  Negative for  shortness of breath and wheezing.   Cardiovascular:  Negative for chest pain.  Gastrointestinal: Negative.   Genitourinary: Negative.   Musculoskeletal: Negative.   Skin: Negative.   Neurological: Negative.   Endo/Heme/Allergies: Negative.   Psychiatric/Behavioral:  Positive for depression. The patient is nervous/anxious. The patient does not have insomnia.       Objective:     BP 126/85 (BP Location: Right Arm, Patient Position: Sitting, Cuff Size: Normal)   Resp 20  BP Readings from Last 3 Encounters:  01/14/22 126/85  01/30/21 (!) 140/100  01/11/21 140/90   Wt Readings from Last 3 Encounters:  02/27/21 220 lb (99.8 kg)  01/30/21 226 lb 9.6 oz (102.8 kg)  01/12/21 225 lb (102.1 kg)      Physical Exam Vitals and nursing note reviewed.  Constitutional:      Appearance: Normal appearance.  HENT:     Head: Normocephalic and atraumatic.     Right Ear: External ear normal.     Left Ear: External ear normal.     Nose: Nose normal.     Mouth/Throat:     Mouth: Mucous membranes are moist.     Pharynx: Oropharynx is clear.  Eyes:     Extraocular Movements: Extraocular movements intact.     Conjunctiva/sclera: Conjunctivae normal.     Pupils: Pupils are equal, round, and reactive to light.  Cardiovascular:     Rate and Rhythm: Normal rate and regular rhythm.     Pulses: Normal pulses.     Heart sounds: Normal heart sounds.  Pulmonary:     Effort: Pulmonary effort is normal.     Breath sounds: Normal breath sounds.  Musculoskeletal:        General: Normal range of motion.     Cervical back: Normal range of motion and neck supple.  Skin:    General: Skin is warm and dry.  Neurological:     General: No focal deficit present.     Mental Status: He is alert and oriented to person, place, and time.  Psychiatric:        Mood and Affect: Mood normal.        Behavior: Behavior normal.        Thought Content: Thought content normal.        Judgment: Judgment normal.        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) - Primary   Relevant Medications   irbesartan (AVAPRO) 300 MG tablet   amLODipine (NORVASC) 5 MG tablet   hydrochlorothiazide (HYDRODIURIL) 25 MG tablet   traZODone (DESYREL) 50 MG tablet   aspirin EC 325 MG tablet   isosorbide mononitrate (IMDUR) 30 MG 24 hr tablet     Respiratory   Asthma, chronic   Relevant Medications   cetirizine (ZYRTEC ALLERGY) 10 MG tablet     Other   Hx of CABG   Hyperlipidemia   Relevant Medications   irbesartan (AVAPRO) 300 MG tablet   amLODipine (NORVASC) 5 MG tablet   hydrochlorothiazide (HYDRODIURIL) 25 MG tablet   aspirin EC 325 MG tablet   isosorbide mononitrate (IMDUR) 30 MG 24 hr tablet   Polysubstance abuse (HCC)   Psychophysiological insomnia   Relevant Medications   traZODone (DESYREL) 50 MG tablet   1. 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  On review of patient's medical record, patient was previously on Imdur for stable angina, restart at this time.  Continue follow-up with cardiology.  Patient education given on supportive care, red flags given for prompt reevaluation. - aspirin EC 325 MG tablet; Take 1 tablet (325 mg total) by mouth daily.  Dispense: 30 tablet; Refill: 1 - isosorbide mononitrate (IMDUR) 30 MG 24 hr tablet; Take 1 tablet (30 mg total) by mouth daily.  Dispense: 30 tablet; Refill: 1  2. Essential hypertension Continue current regimen - irbesartan (AVAPRO) 300 MG tablet; Take 1 tablet (300 mg total) by  mouth daily.  Dispense: 30 tablet; Refill: 1 - amLODipine (NORVASC) 5 MG tablet; Take 1 tablet (5 mg total) by mouth daily.  Dispense: 30 tablet; Refill: 1 - hydrochlorothiazide (HYDRODIURIL) 25 MG tablet; Take 1 tablet (25 mg total) by mouth daily.  Dispense: 30 tablet; Refill: 1  3. Hx of CABG   4. Mixed hyperlipidemia   5. Mild intermittent chronic asthma without complication Restart Zyrtec - cetirizine (ZYRTEC ALLERGY) 10 MG tablet; Take 1 tablet (10 mg total) by mouth daily.  Dispense: 30 tablet; Refill: 1  6. Psychophysiological insomnia Continue current regimen - traZODone (DESYREL) 50 MG tablet; Take 1 tablet (50 mg total) by mouth at bedtime.  Dispense: 30 tablet; Refill: 1  7. Polysubstance abuse (HCC) Currently in substance abuse treatment program  No AVS created, patient does not have access to that at this time, no printer available on screening van.  Teach back method used for patient education   I have reviewed the patient's medical history (PMH, PSH, Social History, Family History, Medications, and allergies) , and have been updated if relevant. I spent 30 minutes reviewing chart and  face to face time with patient.    Return if symptoms worsen or fail to improve.    Kasandra Knudsen Mayers, PA-C

## 2022-01-16 ENCOUNTER — Encounter: Payer: Self-pay | Admitting: Physician Assistant

## 2022-01-16 DIAGNOSIS — F5104 Psychophysiologic insomnia: Secondary | ICD-10-CM | POA: Insufficient documentation

## 2022-02-27 DIAGNOSIS — Z13220 Encounter for screening for lipoid disorders: Secondary | ICD-10-CM | POA: Diagnosis not present

## 2022-02-27 DIAGNOSIS — Z114 Encounter for screening for human immunodeficiency virus [HIV]: Secondary | ICD-10-CM | POA: Diagnosis not present

## 2022-02-27 DIAGNOSIS — Z1159 Encounter for screening for other viral diseases: Secondary | ICD-10-CM | POA: Diagnosis not present

## 2022-02-27 DIAGNOSIS — Z111 Encounter for screening for respiratory tuberculosis: Secondary | ICD-10-CM | POA: Diagnosis not present

## 2022-02-27 DIAGNOSIS — E1165 Type 2 diabetes mellitus with hyperglycemia: Secondary | ICD-10-CM | POA: Diagnosis not present

## 2022-02-27 DIAGNOSIS — Z1211 Encounter for screening for malignant neoplasm of colon: Secondary | ICD-10-CM | POA: Diagnosis not present

## 2022-02-27 DIAGNOSIS — Z9989 Dependence on other enabling machines and devices: Secondary | ICD-10-CM | POA: Diagnosis not present

## 2022-02-27 DIAGNOSIS — Z6831 Body mass index (BMI) 31.0-31.9, adult: Secondary | ICD-10-CM | POA: Diagnosis not present

## 2022-02-27 DIAGNOSIS — Z713 Dietary counseling and surveillance: Secondary | ICD-10-CM | POA: Diagnosis not present

## 2022-02-27 DIAGNOSIS — Z131 Encounter for screening for diabetes mellitus: Secondary | ICD-10-CM | POA: Diagnosis not present

## 2022-12-27 ENCOUNTER — Telehealth: Payer: Self-pay | Admitting: *Deleted

## 2022-12-27 NOTE — Patient Outreach (Signed)
  Medicaid Managed Care   Unsuccessful Outreach Note  12/27/2022 Name: Edward Farmer MRN: [id number] DOB: 1961-04-13  Referred by: [name], MD Reason for referral : High Risk Managed Medicaid (Unsuccessful RNCM telephone outreach)   An unsuccessful telephone outreach was attempted today. The patient was referred to the case management team for assistance with care management and care coordination.   Follow Up Plan: The care management team will reach out to the patient again over the next 7 days.   Estanislado Emms RN, BSN [place]  [facility] RN Care Coordinator [phone]

## 2022-12-30 ENCOUNTER — Telehealth: Payer: Self-pay | Admitting: *Deleted

## 2022-12-30 NOTE — Patient Outreach (Signed)
  Medicaid Managed Care   Unsuccessful Outreach Note  12/30/2022 Name: Edward Farmer MRN: [id number] DOB: 07/29/1960  Referred by: [name], MD Reason for referral : High Risk Managed Medicaid (Unsuccessful RNCM telephone outreach)   A second unsuccessful telephone outreach was attempted today. The patient was referred to the case management team for assistance with care management and care coordination.   Follow Up Plan: A HIPAA compliant phone message was left for the patient providing contact information and requesting a return call.   Estanislado Emms RN, BSN [place]  [facility] RN Care Coordinator [phone]

## 2022-12-30 NOTE — Patient Instructions (Signed)
Visit Information  Mr. Edward Farmer  - as a part of your Medicaid benefit, you are eligible for care management and care coordination services at no cost or copay. I was unable to reach you by phone today but would be happy to help you with your health related needs. Please feel free to call me @ [phone].   A member of the Managed Medicaid care management team will reach out to you again over the next 7 days.   Estanislado Emms RN, BSN [place]  [facility] RN Care Coordinator [phone]

## 2022-12-31 ENCOUNTER — Telehealth: Payer: Self-pay | Admitting: *Deleted

## 2022-12-31 NOTE — Patient Outreach (Signed)
  Medicaid Managed Care   Unsuccessful Outreach Note  12/31/2022 Name: Edward Farmer MRN: [id number] DOB: May 23, 1960  Referred by: [name], MD Reason for referral : High Risk Managed Medicaid (Unsuccessful RNCM telephone outreach)   Third unsuccessful telephone outreach was attempted today. The patient was referred to the case management team for assistance with care management and care coordination. The patient's primary care provider has been notified of our unsuccessful attempts to make or maintain contact with the patient. The care management team is pleased to engage with this patient at any time in the future should he/she be interested in assistance from the care management team.   Follow Up Plan: We have been unable to make contact with the patient for follow up. The care management team is available to follow up with the patient after provider conversation with the patient regarding recommendation for care management engagement and subsequent re-referral to the care management team.   Estanislado Emms RN, BSN [place]  [facility] RN Care Coordinator [phone]
